# Patient Record
Sex: Female | Born: 1995 | Hispanic: Yes | Smoking: Never smoker
Health system: Southern US, Community
[De-identification: ages and names within clinical notes are randomized; demographics above are authoritative.]

## PROBLEM LIST (undated history)

## (undated) ENCOUNTER — Inpatient Hospital Stay: Payer: Self-pay

## (undated) DIAGNOSIS — J302 Other seasonal allergic rhinitis: Secondary | ICD-10-CM

## (undated) DIAGNOSIS — K5900 Constipation, unspecified: Secondary | ICD-10-CM

## (undated) HISTORY — PX: NO PAST SURGERIES: SHX2092

---
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
## 2017-02-01 ENCOUNTER — Emergency Department: Payer: BLUE CROSS/BLUE SHIELD

## 2017-02-01 ENCOUNTER — Encounter: Payer: Self-pay | Admitting: *Deleted

## 2017-02-01 DIAGNOSIS — J189 Pneumonia, unspecified organism: Secondary | ICD-10-CM | POA: Insufficient documentation

## 2017-02-01 DIAGNOSIS — Z79899 Other long term (current) drug therapy: Secondary | ICD-10-CM | POA: Insufficient documentation

## 2017-02-01 DIAGNOSIS — F17210 Nicotine dependence, cigarettes, uncomplicated: Secondary | ICD-10-CM | POA: Insufficient documentation

## 2017-02-01 DIAGNOSIS — R0789 Other chest pain: Secondary | ICD-10-CM | POA: Insufficient documentation

## 2017-02-01 DIAGNOSIS — R079 Chest pain, unspecified: Secondary | ICD-10-CM | POA: Diagnosis present

## 2017-02-01 NOTE — ED Triage Notes (Signed)
Pt c/o ongoing cough w/ related chest pain that is reproducible w/ cough, movement and palpation. Pt c/o nausea x 2-3 days. Pt states pain w/ cough worsening since yesterday. Pt c/o shortness of breath and dizziness x 2 weeks ago and has been intermittent. Pt is ambulatory and in no acute respiratory distress, able to complete full sentences and in not tachypneic.

## 2017-02-01 NOTE — ED Triage Notes (Signed)
Pt ambulatory to triage, A&O x 4, no acute distress observed.

## 2017-02-02 ENCOUNTER — Emergency Department
Admission: EM | Admit: 2017-02-02 | Discharge: 2017-02-02 | Disposition: A | Discharge status: Home / Self Care | Payer: BLUE CROSS/BLUE SHIELD | Attending: Emergency Medicine | Admitting: Emergency Medicine

## 2017-02-02 DIAGNOSIS — R0789 Other chest pain: Secondary | ICD-10-CM

## 2017-02-02 DIAGNOSIS — J189 Pneumonia, unspecified organism: Secondary | ICD-10-CM

## 2017-02-02 LAB — BASIC METABOLIC PANEL
Anion gap: 8 (ref 5–15)
BUN: 17 mg/dL (ref 6–20)
CO2: 25 mmol/L (ref 22–32)
CREATININE: 0.46 mg/dL (ref 0.44–1.00)
Calcium: 9.4 mg/dL (ref 8.9–10.3)
Chloride: 106 mmol/L (ref 101–111)
GFR calc Af Amer: >60 mL/min (ref >60)
Glucose, Bld: 86 mg/dL (ref 65–99)
POTASSIUM: 3.6 mmol/L (ref 3.5–5.1)
SODIUM: 139 mmol/L (ref 135–145)

## 2017-02-02 LAB — CBC
HCT: 38.1 % (ref 35.0–47.0)
Hemoglobin: 13.1 g/dL (ref 12.0–16.0)
MCH: 27.1 pg (ref 26.0–34.0)
MCHC: 34.4 g/dL (ref 32.0–36.0)
MCV: 78.7 fL — ABNORMAL LOW (ref 80.0–100.0)
PLATELETS: 307 10*3/uL (ref 150–440)
RBC: 4.84 MIL/uL (ref 3.80–5.20)
RDW: 15.8 % — ABNORMAL HIGH (ref 11.5–14.5)
WBC: 10.7 10*3/uL (ref 3.6–11.0)

## 2017-02-02 LAB — FIBRIN DERIVATIVES D-DIMER (ARMC ONLY): Fibrin derivatives D-dimer (ARMC): 191.17 (ref 0.00–499.00)

## 2017-02-02 LAB — TROPONIN I

## 2017-02-02 MED ORDER — TRAMADOL HCL 50 MG PO TABS
ORAL_TABLET | ORAL | Status: AC
  Filled 2017-02-02: qty 1

## 2017-02-02 MED ORDER — AZITHROMYCIN 250 MG PO TABS: ORAL_TABLET | ORAL | 0 refills | Status: AC

## 2017-02-02 MED ORDER — TRAMADOL HCL 50 MG PO TABS
50.0000 mg | ORAL_TABLET | Freq: Once | ORAL | Status: AC
  Administered 2017-02-02: 50 mg via ORAL

## 2017-02-02 MED ORDER — TRAMADOL HCL 50 MG PO TABS: 50.0000 mg | ORAL_TABLET | Freq: Four times a day (QID) | ORAL | 0 refills | Status: DC | PRN

## 2017-02-02 MED ORDER — IBUPROFEN 800 MG PO TABS
800.0000 mg | ORAL_TABLET | Freq: Once | ORAL | Status: AC
  Administered 2017-02-02: 800 mg via ORAL
  Filled 2017-02-02: qty 1

## 2017-02-02 NOTE — ED Provider Notes (Signed)
[facility] Emergency Department Provider Note   ____________________________________________   First MD Initiated Contact with Patient 02/02/17 [phone]     (approximate)  I have reviewed the triage vital signs and the nursing notes.   HISTORY  Chief Complaint Pleurisy and Cough    HPI Gina Lester is a 21 y.o. female who comes into the hospital today with 2 weeks of chest pain. The patient reports that it was not initially bothering her really but then it became sharper. She reports that she did develop some trouble breathing and reports that now when she moves a little bit the side or takes a deep breath or coughs she has pain. The patient reports that she has not taken anything for pain as she doesn't typically like to take medication. The patient denies any sick contacts. She rates her pain a 5 out of 10 in intensity. She states her pain is currently mid chest right greater than left. She's had some nausea and dizziness with no vomiting. She reports that she has no abdominal pain as well. She came in to get evaluated for the symptoms.   Past Medical History:  Diagnosis Date  . Constipation   . Seasonal allergies     Patient Active Problem List   Diagnosis Date Noted  . Premature rupture of membranes 12/10/2015  . Pregnancy 10/23/2015  . Abdominal pain affecting pregnancy 09/07/2015  . Rubella non-immune status, antepartum 05/19/2015  . Maternal varicella, non-immune 05/19/2015  . Abdominal pain 04/17/2014  . Transaminitis 04/17/2014    Past Surgical History:  Procedure Laterality Date  . NO PAST SURGERIES      Prior to Admission medications   Medication Sig Start Date End Date Taking? Authorizing Provider  albuterol (PROVENTIL HFA;VENTOLIN HFA) 108 (90 Base) MCG/ACT inhaler Inhale 2 puffs into the lungs every 6 (six) hours as needed for wheezing or shortness of breath. 05/28/16   Jene Every, MD  albuterol (PROVENTIL HFA;VENTOLIN  HFA) 108 (90 Base) MCG/ACT inhaler Inhale 2 puffs into the lungs every 6 (six) hours as needed for wheezing or shortness of breath. 06/14/16   Emily Filbert, MD  azithromycin (ZITHROMAX Z-PAK) 250 MG tablet Take 2 tablets (500 mg) on  Day 1,  followed by 1 tablet (250 mg) once daily on Days 2 through 5. 02/02/17 02/07/17  Rebecka Apley, MD  cetirizine (ZYRTEC) 10 MG tablet Take 1 tablet (10 mg total) by mouth daily. 06/14/16   Emily Filbert, MD  ondansetron (ZOFRAN ODT) 4 MG disintegrating tablet Take 1 tablet (4 mg total) by mouth every 8 (eight) hours as needed for nausea or vomiting. 06/17/16   Rebecka Apley, MD  predniSONE (STERAPRED UNI-PAK 21 TAB) 10 MG (21) TBPK tablet Dispense steroid taper pack as directed 06/14/16   Emily Filbert, MD  traMADol (ULTRAM) 50 MG tablet Take 1 tablet (50 mg total) by mouth every 6 (six) hours as needed. 02/02/17   Rebecka Apley, MD    Allergies Patient has no known allergies.  Family History  Problem Relation Age of Onset  . Gallbladder disease Mother     Social History Social History  Substance Use Topics  . Smoking status: Current Every Day Smoker    Types: Cigarettes  . Smokeless tobacco: Never Used  . Alcohol use Yes    Review of Systems  Constitutional: No fever/chills Eyes: No visual changes. ENT: No sore throat. Cardiovascular:  chest pain. Respiratory:  shortness of breath. Gastrointestinal: No  abdominal pain.  No nausea, no vomiting.  No diarrhea.  No constipation. Genitourinary: Negative for dysuria. Musculoskeletal: Negative for back pain. Skin: Negative for rash. Neurological: Negative for headaches, focal weakness or numbness.   ____________________________________________   PHYSICAL EXAM:  VITAL SIGNS: ED Triage Vitals  Enc Vitals Group     BP 02/01/17 2319 103/61     Pulse Rate 02/01/17 2319 64     Resp 02/01/17 2319 20     Temp 02/01/17 2319 98.6 F (37 C)     Temp Source  02/01/17 2319 Oral     SpO2 02/01/17 2319 100 %     Weight 02/01/17 2319 135 lb (61.2 kg)     Height 02/01/17 2319 5' (1.524 m)     Head Circumference --      Peak Flow --      Pain Score 02/01/17 2318 4     Pain Loc --      Pain Edu? --      Excl. in GC? --     Constitutional: Alert and oriented. Well appearing and in Mild distress. Eyes: Conjunctivae are normal. PERRL. EOMI. Head: Atraumatic. Nose: No congestion/rhinnorhea. Mouth/Throat: Mucous membranes are moist.  Oropharynx non-erythematous. Cardiovascular: Normal rate, regular rhythm. Grossly normal heart sounds.  Good peripheral circulation. Respiratory: Normal respiratory effort.  No retractions. Lungs CTAB. Tenderness to palpation of right anterior chest wall Gastrointestinal: Soft and nontender. No distention. Positive bowel sounds Musculoskeletal: No lower extremity tenderness nor edema.   Neurologic:  Normal speech and language.  Skin:  Skin is warm, dry and intact. Marland Kitchen Psychiatric: Mood and affect are normal.   ____________________________________________   LABS (all labs ordered are listed, but only abnormal results are displayed)  Labs Reviewed  CBC - Abnormal; Notable for the following:       Result Value   MCV 78.7 (*)    RDW 15.8 (*)    All other components within normal limits  BASIC METABOLIC PANEL  TROPONIN I  FIBRIN DERIVATIVES D-DIMER ([facility] ONLY)   ____________________________________________  EKG  ED ECG REPORT I, Rebecka Apley, the attending physician, personally viewed and interpreted this ECG.   Date: 02/01/2017  EKG Time: 2346  Rate: 64  Rhythm: normal sinus rhythm  Axis: normal  Intervals:none  ST&T Change: none  ____________________________________________  RADIOLOGY  Dg Chest 2 View  Result Date: 02/02/2017 CLINICAL DATA:  Cough and reproducible chest pain. Shortness of breath for 2 weeks. EXAM: CHEST  2 VIEW COMPARISON:  Chest radiograph June 14, 2016 FINDINGS:  Cardiomediastinal silhouette is normal. Faint patchy interstitial prominence. No pleural effusions or focal consolidations. Trachea projects midline and there is no pneumothorax. Soft tissue planes and included osseous structures are non-suspicious. IMPRESSION: Faint interstitial prominence seen with atypical infection and reactive airway disease. Electronically Signed   By: Awilda Metro M.D.   On: 02/02/2017 00:24    ____________________________________________   PROCEDURES  Procedure(s) performed: None  Procedures  Critical Care performed: No  ____________________________________________   INITIAL IMPRESSION / ASSESSMENT AND PLAN / ED COURSE  Pertinent labs & imaging results that were available during my care of the patient were reviewed by me and considered in my medical decision making (see chart for details).  This is an 21 year old female who comes into the hospital today with some chest pain. She's also had some shortness of breath and pain with deep inspiration breathing coughing and moving. I did check a CBC and BMP and troponin and a d-dimer. As  the patient has been having pain for 2 weeks I felt that the patient did not need a repeat troponin just one. The patient also did have a chest x-ray which showed some patchy airspace disease. The patient d-dimer is not unremarkable. As the patient's blood work is unremarkable and feel that I will treat the patient for possible pneumonia given her chest x-ray. The patient also receive a dose of tramadol and ibuprofen. I will discharge the patient to home and encouraged her to follow up with her primary care physician or the acute care clinic. The patient has no further complaints or concerns has no other questions at this time. She'll be discharged home.      ____________________________________________   FINAL CLINICAL IMPRESSION(S) / ED DIAGNOSES  Final diagnoses:  Chest wall pain  Pneumonia due to infectious organism,  unspecified laterality, unspecified part of lung      NEW MEDICATIONS STARTED DURING THIS VISIT:  Discharge Medication List as of 02/02/2017  3:44 AM    START taking these medications   Details  azithromycin (ZITHROMAX Z-PAK) 250 MG tablet Take 2 tablets (500 mg) on  Day 1,  followed by 1 tablet (250 mg) once daily on Days 2 through 5., Print    traMADol (ULTRAM) 50 MG tablet Take 1 tablet (50 mg total) by mouth every 6 (six) hours as needed., Starting Sat 02/02/2017, Print         Note:  This document was prepared using Dragon voice recognition software and may include unintentional dictation errors.    Rebecka ApleyWebster, Idali Lafever P, MD 02/02/17 [phone]

## 2017-02-02 NOTE — Discharge Instructions (Signed)
Please follow up with your primary care physician or with the acute care clinic for further evaluation

## 2017-04-16 ENCOUNTER — Encounter: Payer: Self-pay | Admitting: Emergency Medicine

## 2017-04-16 ENCOUNTER — Emergency Department
Admission: EM | Admit: 2017-04-16 | Discharge: 2017-04-16 | Disposition: A | Discharge status: Home / Self Care | Payer: BLUE CROSS/BLUE SHIELD | Attending: Emergency Medicine | Admitting: Emergency Medicine

## 2017-04-16 DIAGNOSIS — Z79899 Other long term (current) drug therapy: Secondary | ICD-10-CM | POA: Diagnosis not present

## 2017-04-16 DIAGNOSIS — B0052 Herpesviral keratitis: Secondary | ICD-10-CM | POA: Insufficient documentation

## 2017-04-16 DIAGNOSIS — H1013 Acute atopic conjunctivitis, bilateral: Secondary | ICD-10-CM | POA: Insufficient documentation

## 2017-04-16 DIAGNOSIS — F17210 Nicotine dependence, cigarettes, uncomplicated: Secondary | ICD-10-CM | POA: Diagnosis not present

## 2017-04-16 DIAGNOSIS — H5713 Ocular pain, bilateral: Secondary | ICD-10-CM | POA: Diagnosis present

## 2017-04-16 MED ORDER — KETOROLAC TROMETHAMINE 0.5 % OP SOLN: 1.0000 [drp] | Freq: Four times a day (QID) | OPHTHALMIC | 0 refills | Status: DC

## 2017-04-16 MED ORDER — TETRACAINE HCL 0.5 % OP SOLN
2.0000 [drp] | Freq: Once | OPHTHALMIC | Status: DC
  Filled 2017-04-16: qty 4

## 2017-04-16 MED ORDER — FLUORESCEIN SODIUM 0.6 MG OP STRP
1.0000 | ORAL_STRIP | Freq: Once | OPHTHALMIC | Status: DC
  Filled 2017-04-16: qty 1

## 2017-04-16 MED ORDER — AZELASTINE HCL 0.05 % OP SOLN: 1.0000 [drp] | Freq: Two times a day (BID) | OPHTHALMIC | 1 refills | Status: DC

## 2017-04-16 MED ORDER — CIPROFLOXACIN HCL 0.3 % OP SOLN: 1.0000 [drp] | OPHTHALMIC | 0 refills | Status: AC

## 2017-04-16 NOTE — ED Provider Notes (Signed)
[facility] Emergency Department Provider Note  ____________________________________________  Time seen: Approximately 9:18 PM  I have reviewed the triage vital signs and the nursing notes.   HISTORY  Chief Complaint Eye Pain    HPI Gina Lester is a 21 y.o. female presents emergency department complaining ofbilateral eye pain. Patient reports that she has had itching, burning, redness to the right eye 2 weeks. Over the last 3-4 days she has began to experience similar symptoms to left eye. No definitive drainage. Patient reports that she has severe allergic allergies to seasonal pollens. No nasal congestion, sinus pressure. No visual changes. Patient denies any trauma to the eye. Patient wears glasses but does not wear contacts.   Past Medical History:  Diagnosis Date  . Constipation   . Seasonal allergies     Patient Active Problem List   Diagnosis Date Noted  . Premature rupture of membranes 12/10/2015  . Pregnancy 10/23/2015  . Abdominal pain affecting pregnancy 09/07/2015  . Rubella non-immune status, antepartum 05/19/2015  . Maternal varicella, non-immune 05/19/2015  . Abdominal pain 04/17/2014  . Transaminitis 04/17/2014    Past Surgical History:  Procedure Laterality Date  . NO PAST SURGERIES      Prior to Admission medications   Medication Sig Start Date End Date Taking? Authorizing Provider  albuterol (PROVENTIL HFA;VENTOLIN HFA) 108 (90 Base) MCG/ACT inhaler Inhale 2 puffs into the lungs every 6 (six) hours as needed for wheezing or shortness of breath. 05/28/16   Jene Every, MD  albuterol (PROVENTIL HFA;VENTOLIN HFA) 108 (90 Base) MCG/ACT inhaler Inhale 2 puffs into the lungs every 6 (six) hours as needed for wheezing or shortness of breath. 06/14/16   Emily Filbert, MD  azelastine (OPTIVAR) 0.05 % ophthalmic solution Place 1 drop into both eyes 2 (two) times daily. 04/16/17   Helana Macbride, Delorise Royals, PA-C  cetirizine  (ZYRTEC) 10 MG tablet Take 1 tablet (10 mg total) by mouth daily. 06/14/16   Emily Filbert, MD  ciprofloxacin (CILOXAN) 0.3 % ophthalmic solution Place 1 drop into both eyes every 2 (two) hours. Administer 1 drop, every 2 hours, while awake, for 2 days. Then 1 drop, every 4 hours, while awake, for the next 5 days. 04/16/17 04/21/17  Yesli Vanderhoff, Delorise Royals, PA-C  ketorolac (ACULAR) 0.5 % ophthalmic solution Place 1 drop into both eyes 4 (four) times daily. 04/16/17   Cybil Senegal, Delorise Royals, PA-C  ondansetron (ZOFRAN ODT) 4 MG disintegrating tablet Take 1 tablet (4 mg total) by mouth every 8 (eight) hours as needed for nausea or vomiting. 06/17/16   Rebecka Apley, MD  predniSONE (STERAPRED UNI-PAK 21 TAB) 10 MG (21) TBPK tablet Dispense steroid taper pack as directed 06/14/16   Emily Filbert, MD  traMADol (ULTRAM) 50 MG tablet Take 1 tablet (50 mg total) by mouth every 6 (six) hours as needed. 02/02/17   Rebecka Apley, MD    Allergies Patient has no known allergies.  Family History  Problem Relation Age of Onset  . Gallbladder disease Mother     Social History Social History  Substance Use Topics  . Smoking status: Current Every Day Smoker    Types: Cigarettes  . Smokeless tobacco: Never Used  . Alcohol use Yes     Review of Systems  Constitutional: No fever/chills Eyes: No visual changes. No discharge. Positive for irritation, pain to bilateral eyes, right eye is been ongoing 2 weeks, left eye 3-4 days. ENT: No upper respiratory complaints. Cardiovascular: no chest  pain. Respiratory: no cough. No SOB. Gastrointestinal: No abdominal pain.  No nausea, no vomiting.  No diarrhea.  No constipation. Musculoskeletal: Negative for musculoskeletal pain. Skin: Negative for rash, abrasions, lacerations, ecchymosis. Neurological: Negative for headaches, focal weakness or numbness. 10-point ROS otherwise negative.  ____________________________________________   PHYSICAL  EXAM:  VITAL SIGNS: ED Triage Vitals  Enc Vitals Group     BP 04/16/17 1914 112/74     Pulse Rate 04/16/17 1914 90     Resp 04/16/17 1914 17     Temp 04/16/17 1914 97.8 F (36.6 C)     Temp Source 04/16/17 1914 Oral     SpO2 04/16/17 1914 99 %     Weight 04/16/17 1916 135 lb (61.2 kg)     Height --      Head Circumference --      Peak Flow --      Pain Score 04/16/17 2010 4     Pain Loc --      Pain Edu? --      Excl. in GC? --      Constitutional: Alert and oriented. Well appearing and in no acute distress. Eyes: Conjunctivae are Erythematous bilaterally.Marland Kitchen PERRL. EOMI. no visible foreign body with funduscopic exam. Funduscopic exam of both eyes reveals good red reflex, vasculature optic disc with no abnormality. Both eyes are anesthetized using tetracaine drops. Fluorescein staining is applied. Area of uptake in the 6:00 position right I, area of uptake 9:00 position left eye. No foreign body. Areas of uptake consistent with dendritic ulcer. Head: Atraumatic. ENT:      Ears:       Nose: No congestion/rhinnorhea.      Mouth/Throat: Mucous membranes are moist.  Neck: No stridor.    Cardiovascular: Normal rate, regular rhythm. Normal S1 and S2.  Good peripheral circulation. Respiratory: Normal respiratory effort without tachypnea or retractions. Lungs CTAB. Good air entry to the bases with no decreased or absent breath sounds. Musculoskeletal: Full range of motion to all extremities. No gross deformities appreciated. Neurologic:  Normal speech and language. No gross focal neurologic deficits are appreciated.  Skin:  Skin is warm, dry and intact. No rash noted. Psychiatric: Mood and affect are normal. Speech and behavior are normal. Patient exhibits appropriate insight and judgement.   ____________________________________________   LABS (all labs ordered are listed, but only abnormal results are displayed)  Labs Reviewed - No data to  display ____________________________________________  EKG   ____________________________________________  RADIOLOGY   No results found.  ____________________________________________    PROCEDURES  Procedure(s) performed:    Procedures    Medications  fluorescein ophthalmic strip 1 strip (not administered)  tetracaine (PONTOCAINE) 0.5 % ophthalmic solution 2 drop (not administered)     ____________________________________________   INITIAL IMPRESSION / ASSESSMENT AND PLAN / ED COURSE  Pertinent labs & imaging results that were available during my care of the patient were reviewed by me and considered in my medical decision making (see chart for details).  Review of the [place] CSRS was performed in accordance of the [facility] prior to dispensing any controlled drugs.     Patient's diagnosis is consistent with allergic conjunctivitis that has led to dendritic ulcers bilaterally. No contact wearing. Patient will be treated with Cipro eyedrops, Acular for symptom control, Optivar for allergic conjunctivitis symptoms.. Patient will follow-up with ophthalmology as needed. Patient is given ED precautions to return to the ED for any worsening or new symptoms.     ____________________________________________  FINAL CLINICAL IMPRESSION(S) /  ED DIAGNOSES  Final diagnoses:  Dendritic ulcer  Allergic conjunctivitis of both eyes      NEW MEDICATIONS STARTED DURING THIS VISIT:  New Prescriptions   AZELASTINE (OPTIVAR) 0.05 % OPHTHALMIC SOLUTION    Place 1 drop into both eyes 2 (two) times daily.   CIPROFLOXACIN (CILOXAN) 0.3 % OPHTHALMIC SOLUTION    Place 1 drop into both eyes every 2 (two) hours. Administer 1 drop, every 2 hours, while awake, for 2 days. Then 1 drop, every 4 hours, while awake, for the next 5 days.   KETOROLAC (ACULAR) 0.5 % OPHTHALMIC SOLUTION    Place 1 drop into both eyes 4 (four) times daily.        This chart was dictated using voice recognition  software/Dragon. Despite best efforts to proofread, errors can occur which can change the meaning. Any change was purely unintentional.    Racheal Patches, PA-C 04/16/17 2124    Dionne Bucy, MD 04/16/17 2142

## 2017-04-16 NOTE — ED Notes (Signed)
Patient discharged to home per MD order. Patient in stable condition, and deemed medically cleared by ED provider for discharge. Discharge instructions reviewed with patient/family using "Teach Back"; verbalized understanding of medication education and administration, and information about follow-up care. Denies further concerns. ° °

## 2017-04-16 NOTE — ED Notes (Signed)
Pt usually wears glasses for distance, but does not have them with her at this time.

## 2017-04-16 NOTE — ED Triage Notes (Signed)
Pt c/o right eye pain x2 weeks. Pt describes eye pain as "something in it and scratchy." Per pt she has tried to wash out the eye as well as placed drop, without relief. Pts right eye is red on assessment with clear drainage.

## 2017-04-16 NOTE — ED Notes (Signed)
Pt in with co right eye pain x 2 weeks denies any injury or trauma. States "feels like I have an eyelash In eye". Has tried otc eye drops without relief, denies any itching or discharge.

## 2017-04-16 NOTE — ED Notes (Signed)
Pt states R eye "has been bothering" her x 2 weeks. States was using eye drops. Has eye appt in November but states. Pt wearing sunglasses. States "at the beginning there was an eyelash in there." pt keeps rubbing eye. States sometimes L eye bothers her. Denies seasonal allergies.

## 2017-07-23 NOTE — L&D Delivery Note (Signed)
Delivery Note   Otho PerlMaribel Baltazar Shon HaleLeon is a 22 y.o. G2P1001 at 7882w5d Estimated Date of Delivery: 04/10/18  PRE-OPERATIVE DIAGNOSIS:  1) 2282w5d pregnancy.    POST-OPERATIVE DIAGNOSIS:  1) 6082w5d pregnancy s/p Vaginal, Spontaneous    Delivery Type: Vaginal, Spontaneous    Delivery Anesthesia: None   Labor Complications:   none    ESTIMATED BLOOD LOSS: 200 ml    FINDINGS:   1) female infant, Apgar scores of 8  at 1 minute and 9  at 5 minutes and a birthweight of    7lbs 9ozounces.    2) Nuchal cord: yes, loose .   SPECIMENS:   PLACENTA:   Appearance: Intact , cord blood sample collected   Removal: Spontaneous      Disposition:   Held per protocol then discarded  DISPOSITION:  Infant to left in stable condition in the delivery room, with L&D personnel and mother,  NARRATIVE SUMMARY: Labor course:  Ms. Johny ChessMaribel Baltazar Leon is a G2P1001 at 7782w5d who presented for labor management.  She progressed well in labor without pitocin.  She received the no anesthesia and proceeded to complete dilation. She evidenced good maternal expulsive effort during the second stage. She went on to deliver a viable female infant" Padro". Head delivered OA then rotated to LOT for delivery of shoulder. The placenta delivered without problems and was noted to be complete. A perineal and vaginal examination was performed. Episiotomy/Lacerations: None . Vaginal vault count correct x 2. The patient tolerated this well.  Doreene Burkennie Teancum Brule, CNM  04/08/2018 12:23 PM

## 2017-08-22 ENCOUNTER — Encounter: Payer: Self-pay | Admitting: Certified Nurse Midwife

## 2017-08-22 ENCOUNTER — Other Ambulatory Visit (INDEPENDENT_AMBULATORY_CARE_PROVIDER_SITE_OTHER): Payer: BLUE CROSS/BLUE SHIELD

## 2017-08-22 ENCOUNTER — Other Ambulatory Visit: Payer: Self-pay | Admitting: Certified Nurse Midwife

## 2017-08-22 ENCOUNTER — Ambulatory Visit: Payer: BLUE CROSS/BLUE SHIELD | Admitting: Certified Nurse Midwife

## 2017-08-22 VITALS — BP 108/62 | HR 68 | Ht 60.0 in | Wt 141.2 lb

## 2017-08-22 DIAGNOSIS — Z3201 Encounter for pregnancy test, result positive: Secondary | ICD-10-CM

## 2017-08-22 DIAGNOSIS — O26899 Other specified pregnancy related conditions, unspecified trimester: Secondary | ICD-10-CM | POA: Diagnosis not present

## 2017-08-22 DIAGNOSIS — R109 Unspecified abdominal pain: Secondary | ICD-10-CM

## 2017-08-22 DIAGNOSIS — N926 Irregular menstruation, unspecified: Secondary | ICD-10-CM | POA: Diagnosis not present

## 2017-08-22 LAB — POCT URINE PREGNANCY: PREG TEST UR: POSITIVE — AB

## 2017-08-22 NOTE — Patient Instructions (Signed)
Common Medications Safe in Pregnancy  Acne:      Constipation:  Benzoyl Peroxide     Colace  Clindamycin      Dulcolax Suppository  Topica Erythromycin     Fibercon  Salicylic Acid      Metamucil         Miralax AVOID:        Senakot   Accutane    Cough:  Retin-A       Cough Drops  Tetracycline      Phenergan w/ Codeine if Rx  Minocycline      Robitussin (Plain & DM)  Antibiotics:     Crabs/Lice:  Ceclor       RID  Cephalosporins    AVOID:  E-Mycins      Kwell  Keflex  Macrobid/Macrodantin   Diarrhea:  Penicillin      Kao-Pectate  Zithromax      Imodium AD         PUSH FLUIDS AVOID:       Cipro     Fever:  Tetracycline      Tylenol (Regular or Extra  Minocycline       Strength)  Levaquin      Extra Strength-Do not          Exceed 8 tabs/24 hrs Caffeine:        <200mg/day (equiv. To 1 cup of coffee or  approx. 3 12 oz sodas)         Gas: Cold/Hayfever:       Gas-X  Benadryl      Mylicon  Claritin       Phazyme  **Claritin-D        Chlor-Trimeton    Headaches:  Dimetapp      ASA-Free Excedrin  Drixoral-Non-Drowsy     Cold Compress  Mucinex (Guaifenasin)     Tylenol (Regular or Extra  Sudafed/Sudafed-12 Hour     Strength)  **Sudafed PE Pseudoephedrine   Tylenol Cold & Sinus     Vicks Vapor Rub  Zyrtec  **AVOID if Problems With Blood Pressure         Heartburn: Avoid lying down for at least 1 hour after meals  Aciphex      Maalox     Rash:  Milk of Magnesia     Benadryl    Mylanta       1% Hydrocortisone Cream  Pepcid  Pepcid Complete   Sleep Aids:  Prevacid      Ambien   Prilosec       Benadryl  Rolaids       Chamomile Tea  Tums (Limit 4/day)     Unisom  Zantac       Tylenol PM         Warm milk-add vanilla or  Hemorrhoids:       Sugar for taste  Anusol/Anusol H.C.  (RX: Analapram 2.5%)  Sugar Substitutes:  Hydrocortisone OTC     Ok in moderation  Preparation H      Tucks        Vaseline lotion applied to tissue with  wiping    Herpes:     Throat:  Acyclovir      Oragel  Famvir  Valtrex     Vaccines:         Flu Shot Leg Cramps:       *Gardasil  Benadryl      Hepatitis A         Hepatitis B Nasal Spray:         Pneumovax  Saline Nasal Spray     Polio Booster         Tetanus Nausea:       Tuberculosis test or PPD  Vitamin B6 25 mg TID   AVOID:    Dramamine      *Gardasil  Emetrol       Live Poliovirus  Ginger Root 250 mg QID    MMR (measles, mumps &  High Complex Carbs @ Bedtime    rebella)  Sea Bands-Accupressure    Varicella (Chickenpox)  Unisom 1/2 tab TID     *No known complications           If received before Pain:         Known pregnancy;   Darvocet       Resume series after  Lortab        Delivery  Percocet    Yeast:   Tramadol      Femstat  Tylenol 3      Gyne-lotrimin  Ultram       Monistat  Vicodin           MISC:         All Sunscreens           Hair Coloring/highlights          Insect Repellant's          (Including DEET)         Mystic Tans Abdominal Pain During Pregnancy Belly (abdominal) pain is common during pregnancy. Most of the time, it is not a serious problem. Other times, it can be a sign that something is wrong with the pregnancy. Always tell your doctor if you have belly pain. Follow these instructions at home: Monitor your belly pain for any changes. The following actions may help you feel better:  Do not have sex (intercourse) or put anything in your vagina until you feel better.  Rest until your pain stops.  Drink clear fluids if you feel sick to your stomach (nauseous). Do not eat solid food until you feel better.  Only take medicine as told by your doctor.  Keep all doctor visits as told.  Get help right away if:  You are bleeding, leaking fluid, or pieces of tissue come out of your vagina.  You have more pain or cramping.  You keep throwing up (vomiting).  You have pain when you pee (urinate) or have blood in your pee.  You have a  fever.  You do not feel your baby moving as much.  You feel very weak or feel like passing out.  You have trouble breathing, with or without belly pain.  You have a very bad headache and belly pain.  You have fluid leaking from your vagina and belly pain.  You keep having watery poop (diarrhea).  Your belly pain does not go away after resting, or the pain gets worse. This information is not intended to replace advice given to you by your health care provider. Make sure you discuss any questions you have with your health care provider. Document Released: 06/27/2009 Document Revised: 02/15/2016 Document Reviewed: 02/05/2013 Elsevier Interactive Patient Education  2018 [employer] Back Pain in Pregnancy Back pain during pregnancy is common. Back pain may be caused by several factors that are related to changes during your pregnancy. Follow these instructions at home: Managing pain, stiffness, and swelling  If directed, apply ice for sudden (acute) back pain. ? Put ice in a plastic bag. ? Place   a towel between your skin and the bag. ? Leave the ice on for 20 minutes, 2-3 times per day.  If directed, apply heat to the affected area before you exercise: ? Place a towel between your skin and the heat pack or heating pad. ? Leave the heat on for 20-30 minutes. ? Remove the heat if your skin turns bright red. This is especially important if you are unable to feel pain, heat, or cold. You may have a greater risk of getting burned. Activity  Exercise as told by your health care provider. Exercising is the best way to prevent or manage back pain.  Listen to your body when lifting. If lifting hurts, ask for help or bend your knees. This uses your leg muscles instead of your back muscles.  Squat down when picking up something from the floor. Do not bend over.  Only use bed rest as told by your health care provider. Bed rest should only be used for the most severe episodes of back  pain. Standing, Sitting, and Lying Down  Do not stand in one place for long periods of time.  Use good posture when sitting. Make sure your head rests over your shoulders and is not hanging forward. Use a pillow on your lower back if necessary.  Try sleeping on your side, preferably the left side, with a pillow or two between your legs. If you are sore after a night's rest, your bed may be too soft. A firm mattress may provide more support for your back during pregnancy. General instructions  Do not wear high heels.  Eat a healthy diet. Try to gain weight within your health care provider's recommendations.  Use a maternity girdle, elastic sling, or back brace as told by your health care provider.  Take over-the-counter and prescription medicines only as told by your health care provider.  Keep all follow-up visits as told by your health care provider. This is important. This includes any visits with any specialists, such as a physical therapist. Contact a health care provider if:  Your back pain interferes with your daily activities.  You have increasing pain in other parts of your body. Get help right away if:  You develop numbness, tingling, weakness, or problems with the use of your arms or legs.  You develop severe back pain that is not controlled with medicine.  You have a sudden change in bowel or bladder control.  You develop shortness of breath, dizziness, or you faint.  You develop nausea, vomiting, or sweating.  You have back pain that is a rhythmic, cramping pain similar to labor pains. Labor pain is usually 1-2 minutes apart, lasts for about 1 minute, and involves a bearing down feeling or pressure in your pelvis.  You have back pain and your water breaks or you have vaginal bleeding.  You have back pain or numbness that travels down your leg.  Your back pain developed after you fell.  You develop pain on one side of your back.  You see blood in your  urine.  You develop skin blisters in the area of your back pain. This information is not intended to replace advice given to you by your health care provider. Make sure you discuss any questions you have with your health care provider. Document Released: 10/17/2005 Document Revised: 12/15/2015 Document Reviewed: 03/23/2015 Elsevier Interactive Patient Education  2018 [place] for Pregnant Women While you are pregnant, your body will require additional nutrition to help support your  growing baby. It is recommended that you consume:  150 additional calories each day during your first trimester.  300 additional calories each day during your second trimester.  300 additional calories each day during your third trimester.  Eating a healthy, well-balanced diet is very important for your health and for your baby's health. You also have a higher need for some vitamins and minerals, such as folic acid, calcium, iron, and vitamin D. What do I need to know about eating during pregnancy?  Do not try to lose weight or go on a diet during pregnancy.  Choose healthy, nutritious foods. Choose  of a sandwich with a glass of milk instead of a candy bar or a high-calorie sugar-sweetened beverage.  Limit your overall intake of foods that have "empty calories." These are foods that have little nutritional value, such as sweets, desserts, candies, sugar-sweetened beverages, and fried foods.  Eat a variety of foods, especially fruits and vegetables.  Take a prenatal vitamin to help meet the additional needs during pregnancy, specifically for folic acid, iron, calcium, and vitamin D.  Remember to stay active. Ask your health care provider for exercise recommendations that are specific to you.  Practice good food safety and cleanliness, such as washing your hands before you eat and after you prepare raw meat. This helps to prevent foodborne illnesses, such as listeriosis, that can be very  dangerous for your baby. Ask your health care provider for more information about listeriosis. What does 150 extra calories look like? Healthy options for an additional 150 calories each day could be any of the following:  Plain low-fat yogurt (6-8 oz) with  cup of berries.  1 apple with 2 teaspoons of peanut butter.  Cut-up vegetables with  cup of hummus.  Low-fat chocolate milk (8 oz or 1 cup).  1 string cheese with 1 medium orange.   of a peanut butter and jelly sandwich on whole-wheat bread (1 tsp of peanut butter).  For 300 calories, you could eat two of those healthy options each day. What is a healthy amount of weight to gain? The recommended amount of weight for you to gain is based on your pre-pregnancy BMI. If your pre-pregnancy BMI was:  Less than 18 (underweight), you should gain 28-40 lb.  18-24.9 (normal), you should gain 25-35 lb.  25-29.9 (overweight), you should gain 15-25 lb.  Greater than 30 (obese), you should gain 11-20 lb.  What if I am having twins or multiples? Generally, pregnant women who will be having twins or multiples may need to increase their daily calories by 300-600 calories each day. The recommended range for total weight gain is 25-54 lb, depending on your pre-pregnancy BMI. Talk with your health care provider for specific guidance about additional nutritional needs, weight gain, and exercise during your pregnancy. What foods can I eat? Grains Any grains. Try to choose whole grains, such as whole-wheat bread, oatmeal, or brown rice. Vegetables Any vegetables. Try to eat a variety of colors and types of vegetables to get a full range of vitamins and minerals. Remember to wash your vegetables well before eating. Fruits Any fruits. Try to eat a variety of colors and types of fruit to get a full range of vitamins and minerals. Remember to wash your fruits well before eating. Meats and Other Protein Sources Lean meats, including chicken, [place],  fish, and lean cuts of beef, veal, or pork. Make sure that all meats are cooked to "well done." Tofu. Tempeh. Beans. Eggs. Peanut butter  and other nut butters. Seafood, such as shrimp, crab, and lobster. If you choose fish, select types that are higher in omega-3 fatty acids, including salmon, herring, mussels, trout, sardines, and pollock. Make sure that all meats are cooked to food-safe temperatures. Dairy Pasteurized milk and milk alternatives. Pasteurized yogurt and pasteurized cheese. Cottage cheese. Sour cream. Beverages Water. Juices that contain 100% fruit juice or vegetable juice. Caffeine-free teas and decaffeinated coffee. Drinks that contain caffeine are okay to drink, but it is better to avoid caffeine. Keep your total caffeine intake to less than 200 mg each day (12 oz of coffee, tea, or soda) or as directed by your health care provider. Condiments Any pasteurized condiments. Sweets and Desserts Any sweets and desserts. Fats and Oils Any fats and oils. The items listed above may not be a complete list of recommended foods or beverages. Contact your dietitian for more options. What foods are not recommended? Vegetables Unpasteurized (raw) vegetable juices. Fruits Unpasteurized (raw) fruit juices. Meats and Other Protein Sources Cured meats that have nitrates, such as bacon, salami, and hotdogs. Luncheon meats, bologna, or other deli meats (unless they are reheated until they are steaming hot). Refrigerated pate, meat spreads from a meat counter, smoked seafood that is found in the refrigerated section of a store. Raw fish, such as sushi or sashimi. High mercury content fish, such as tilefish, shark, swordfish, and king mackerel. Raw meats, such as tuna or beef tartare. Undercooked meats and poultry. Make sure that all meats are cooked to food-safe temperatures. Dairy Unpasteurized (raw) milk and any foods that have raw milk in them. Soft cheeses, such as feta, queso blanco, queso  fresco, Brie, Camembert cheeses, blue-veined cheeses, and Panela cheese (unless it is made with pasteurized milk, which must be stated on the label). Beverages Alcohol. Sugar-sweetened beverages, such as sodas, teas, or energy drinks. Condiments Homemade fermented foods and drinks, such as pickles, sauerkraut, or kombucha drinks. (Store-bought pasteurized versions of these are okay.) Other Salads that are made in the store, such as ham salad, chicken salad, egg salad, tuna salad, and seafood salad. The items listed above may not be a complete list of foods and beverages to avoid. Contact your dietitian for more information. This information is not intended to replace advice given to you by your health care provider. Make sure you discuss any questions you have with your health care provider. Document Released: 04/23/2014 Document Revised: 12/15/2015 Document Reviewed: 12/22/2013 Elsevier Interactive Patient Education  2018 [place] of Pregnancy The first trimester of pregnancy is from week 1 until the end of week 13 (months 1 through 3). During this time, your baby will begin to develop inside you. At 6-8 weeks, the eyes and face are formed, and the heartbeat can be seen on ultrasound. At the end of 12 weeks, all the baby's organs are formed. Prenatal care is all the medical care you receive before the birth of your baby. Make sure you get good prenatal care and follow all of your doctor's instructions. Follow these instructions at home: Medicines  Take over-the-counter and prescription medicines only as told by your doctor. Some medicines are safe and some medicines are not safe during pregnancy.  Take a prenatal vitamin that contains at least 600 micrograms (mcg) of folic acid.  If you have trouble pooping (constipation), take medicine that will make your stool soft (stool softener) if your doctor approves. Eating and drinking  Eat regular, healthy meals.  Your doctor  will tell you  the amount of weight gain that is right for you.  Avoid raw meat and uncooked cheese.  If you feel sick to your stomach (nauseous) or throw up (vomit): ? Eat 4 or 5 small meals a day instead of 3 large meals. ? Try eating a few soda crackers. ? Drink liquids between meals instead of during meals.  To prevent constipation: ? Eat foods that are high in fiber, like fresh fruits and vegetables, whole grains, and beans. ? Drink enough fluids to keep your pee (urine) clear or pale yellow. Activity  Exercise only as told by your doctor. Stop exercising if you have cramps or pain in your lower belly (abdomen) or low back.  Do not exercise if it is too hot, too humid, or if you are in a place of great height (high altitude).  Try to avoid standing for long periods of time. Move your legs often if you must stand in one place for a long time.  Avoid heavy lifting.  Wear low-heeled shoes. Sit and stand up straight.  You can have sex unless your doctor tells you not to. Relieving pain and discomfort  Wear a good support bra if your breasts are sore.  Take warm water baths (sitz baths) to soothe pain or discomfort caused by hemorrhoids. Use hemorrhoid cream if your doctor says it is okay.  Rest with your legs raised if you have leg cramps or low back pain.  If you have puffy, bulging veins (varicose veins) in your legs: ? Wear support hose or compression stockings as told by your doctor. ? Raise (elevate) your feet for 15 minutes, 3-4 times a day. ? Limit salt in your food. Prenatal care  Schedule your prenatal visits by the twelfth week of pregnancy.  Write down your questions. Take them to your prenatal visits.  Keep all your prenatal visits as told by your doctor. This is important. Safety  Wear your seat belt at all times when driving.  Make a list of emergency phone numbers. The list should include numbers for family, friends, the hospital, and police and fire  departments. General instructions  Ask your doctor for a referral to a local prenatal class. Begin classes no later than at the start of month 6 of your pregnancy.  Ask for help if you need counseling or if you need help with nutrition. Your doctor can give you advice or tell you where to go for help.  Do not use hot tubs, steam rooms, or saunas.  Do not douche or use tampons or scented sanitary pads.  Do not cross your legs for long periods of time.  Avoid all herbs and alcohol. Avoid drugs that are not approved by your doctor.  Do not use any tobacco products, including cigarettes, chewing tobacco, and electronic cigarettes. If you need help quitting, ask your doctor. You may get counseling or other support to help you quit.  Avoid cat litter boxes and soil used by cats. These carry germs that can cause birth defects in the baby and can cause a loss of your baby (miscarriage) or stillbirth.  Visit your dentist. At home, brush your teeth with a soft toothbrush. Be gentle when you floss. Contact a doctor if:  You are dizzy.  You have mild cramps or pressure in your lower belly.  You have a nagging pain in your belly area.  You continue to feel sick to your stomach, you throw up, or you have watery poop (diarrhea).  You have a  bad smelling fluid coming from your vagina.  You have pain when you pee (urinate).  You have increased puffiness (swelling) in your face, hands, legs, or ankles. Get help right away if:  You have a fever.  You are leaking fluid from your vagina.  You have spotting or bleeding from your vagina.  You have very bad belly cramping or pain.  You gain or lose weight rapidly.  You throw up blood. It may look like coffee grounds.  You are around people who have [place] measles, fifth disease, or chickenpox.  You have a very bad headache.  You have shortness of breath.  You have any kind of trauma, such as from a fall or a car  accident. Summary  The first trimester of pregnancy is from week 1 until the end of week 13 (months 1 through 3).  To take care of yourself and your unborn baby, you will need to eat healthy meals, take medicines only if your doctor tells you to do so, and do activities that are safe for you and your baby.  Keep all follow-up visits as told by your doctor. This is important as your doctor will have to ensure that your baby is healthy and growing well. This information is not intended to replace advice given to you by your health care provider. Make sure you discuss any questions you have with your health care provider. Document Released: 12/26/2007 Document Revised: 07/17/2016 Document Reviewed: 07/17/2016 Elsevier Interactive Patient Education  2017 [employer].

## 2017-08-24 NOTE — Progress Notes (Signed)
GYN ENCOUNTER NOTE  Subjective:       Gina Lester is a 22 y.o. [id number] female here for pregnancy confirmation.   Reports missed menses. Endorses nausea with vomiting and intermittent lower abdominal cramping.   Denies difficulty breathing or respiratory distress, chest pain, abdominal pain, vaginal bleeding, dysuria, and leg pain or swelling.    Gynecologic History  Patient's last menstrual period was 07/04/2017.  Estimated date of birth: 04/10/2016.  Gestational age: 58 weeks 0 days.  Contraception: none.  Last Pap: N/A.   Obstetric History OB History  Gravida Para Term Preterm AB Living  2 1 1      0  SAB TAB Ectopic Multiple Live Births        0      # Outcome Date GA Lbr Len/2nd Weight Sex Delivery Anes PTL Lv  2 Current           1 Term 12/11/15 [id number] / 00:33 7 lb 7.2 oz (3.38 kg) M Vag-Spont None        Past Medical History:  Diagnosis Date  . Constipation   . Seasonal allergies     Past Surgical History:  Procedure Laterality Date  . NO PAST SURGERIES      Current Outpatient Medications on File Prior to Visit  Medication Sig Dispense Refill  . albuterol (PROVENTIL HFA;VENTOLIN HFA) 108 (90 Base) MCG/ACT inhaler Inhale 2 puffs into the lungs every 6 (six) hours as needed for wheezing or shortness of breath. (Patient not taking: Reported on 08/22/2017) 1 Inhaler 2  . cetirizine (ZYRTEC) 10 MG tablet Take 1 tablet (10 mg total) by mouth daily. (Patient not taking: Reported on 08/22/2017) 30 tablet 3  . ketorolac (ACULAR) 0.5 % ophthalmic solution Place 1 drop into both eyes 4 (four) times daily. (Patient not taking: Reported on 08/22/2017) 5 mL 0  . ondansetron (ZOFRAN ODT) 4 MG disintegrating tablet Take 1 tablet (4 mg total) by mouth every 8 (eight) hours as needed for nausea or vomiting. (Patient not taking: Reported on 08/22/2017) 20 tablet 0   No current facility-administered medications on file prior to visit.     No Known Allergies  Social  History   Socioeconomic History  . Marital status: Single    Spouse name: Not on file  . Number of children: Not on file  . Years of education: Not on file  . Highest education level: Not on file  Social Needs  . Financial resource strain: Not on file  . Food insecurity - worry: Not on file  . Food insecurity - inability: Not on file  . Transportation needs - medical: Not on file  . Transportation needs - non-medical: Not on file  Occupational History  . Occupation: Stay at home mother  Tobacco Use  . Smoking status: Current Every Day Smoker    Types: Cigarettes  . Smokeless tobacco: Never Used  Substance and Sexual Activity  . Alcohol use: Yes  . Drug use: No  . Sexual activity: Yes    Birth control/protection: None  Other Topics Concern  . Not on file  Social History Narrative       Family History  Problem Relation Age of Onset  . Gallbladder disease Mother     The following portions of the patient's history were reviewed and updated as appropriate: allergies, current medications, past family history, past medical history, past social history, past surgical history and problem list.  Review of Systems:  Review of Systems - Negative except as  noted above. History obtained from the patient.  Objective:   BP 108/62   Pulse 68   Ht 5' (1.524 m)   Wt 141 lb 3.2 oz (64 kg)   LMP 07/04/2017   BMI 27.58 kg/m   General:  Alert and oriented x 4, no apparent distress.  ULTRASOUND REPORT  Location: [facility] Date of Service:  08/22/2017  Indications: Dating/Viability Findings:  Mason Jim intrauterine pregnancy is visualized with a CRL consistent with 6 2/[redacted] weeks gestation, giving an (U/S) EDD of 04/15/18. The (U/S) EDD is consistent with the clinically established (LMP) EDD of 04/10/18.  FHR: 114 BPM CRL measurement: 5.1 mm Yolk sac and early anatomy is normal.  Right Ovary measures 3.2 x 2.4 x 2.0 cm. It is normal in appearance. Left Ovary  measures 2.9 x 1.9 x 1.2 cm. It is normal appearance. There is no obvious evidence of a corpus luteal cyst. Survey of the adnexa demonstrates no adnexal masses. There is no free peritoneal fluid in the cul de sac.  Impression: 1. 6 2/7 week Viable Singleton Intrauterine pregnancy by U/S. 2. (U/S) EDD is consistent with Clinically established (LMP) EDD of 04/10/18.  Recommendations: 1.Clinical correlation with the patient's History and Physical Exam.  Assessment:   1. Missed menses - POCT urine pregnancy  2. Abdominal cramping affecting pregnancy  Plan:   Ultrasound findings reviewed with patient, verbalized understanding.   Discussed home treatment measures for nausea. Bonjesta sample given.   Reviewed red flag symptoms and when to call.   RTC x 2-3 weeks for nurse intake or sooner if needed.    Gunnar Bulla, CNM [facility], [facility]

## 2017-09-05 ENCOUNTER — Ambulatory Visit: Payer: BLUE CROSS/BLUE SHIELD | Admitting: Certified Nurse Midwife

## 2017-09-05 VITALS — BP 102/66 | HR 77 | Ht 60.0 in | Wt 140.0 lb

## 2017-09-05 DIAGNOSIS — Z3A09 9 weeks gestation of pregnancy: Secondary | ICD-10-CM

## 2017-09-05 NOTE — Patient Instructions (Signed)
First Trimester of Pregnancy The first trimester of pregnancy is from week 1 until the end of week 13 (months 1 through 3). During this time, your baby will begin to develop inside you. At 6-8 weeks, the eyes and face are formed, and the heartbeat can be seen on ultrasound. At the end of 12 weeks, all the baby's organs are formed. Prenatal care is all the medical care you receive before the birth of your baby. Make sure you get good prenatal care and follow all of your doctor's instructions. Follow these instructions at home: Medicines  Take over-the-counter and prescription medicines only as told by your doctor. Some medicines are safe and some medicines are not safe during pregnancy.  Take a prenatal vitamin that contains at least 600 micrograms (mcg) of folic acid.  If you have trouble pooping (constipation), take medicine that will make your stool soft (stool softener) if your doctor approves. Eating and drinking  Eat regular, healthy meals.  Your doctor will tell you the amount of weight gain that is right for you.  Avoid raw meat and uncooked cheese.  If you feel sick to your stomach (nauseous) or throw up (vomit): ? Eat 4 or 5 small meals a day instead of 3 large meals. ? Try eating a few soda crackers. ? Drink liquids between meals instead of during meals.  To prevent constipation: ? Eat foods that are high in fiber, like fresh fruits and vegetables, whole grains, and beans. ? Drink enough fluids to keep your pee (urine) clear or pale yellow. Activity  Exercise only as told by your doctor. Stop exercising if you have cramps or pain in your lower belly (abdomen) or low back.  Do not exercise if it is too hot, too humid, or if you are in a place of great height (high altitude).  Try to avoid standing for long periods of time. Move your legs often if you must stand in one place for a long time.  Avoid heavy lifting.  Wear low-heeled shoes. Sit and stand up straight.  You  can have sex unless your doctor tells you not to. Relieving pain and discomfort  Wear a good support bra if your breasts are sore.  Take warm water baths (sitz baths) to soothe pain or discomfort caused by hemorrhoids. Use hemorrhoid cream if your doctor says it is okay.  Rest with your legs raised if you have leg cramps or low back pain.  If you have puffy, bulging veins (varicose veins) in your legs: ? Wear support hose or compression stockings as told by your doctor. ? Raise (elevate) your feet for 15 minutes, 3-4 times a day. ? Limit salt in your food. Prenatal care  Schedule your prenatal visits by the twelfth week of pregnancy.  Write down your questions. Take them to your prenatal visits.  Keep all your prenatal visits as told by your doctor. This is important. Safety  Wear your seat belt at all times when driving.  Make a list of emergency phone numbers. The list should include numbers for family, friends, the hospital, and police and fire departments. General instructions  Ask your doctor for a referral to a local prenatal class. Begin classes no later than at the start of month 6 of your pregnancy.  Ask for help if you need counseling or if you need help with nutrition. Your doctor can give you advice or tell you where to go for help.  Do not use hot tubs, steam rooms, or   saunas.  Do not douche or use tampons or scented sanitary pads.  Do not cross your legs for long periods of time.  Avoid all herbs and alcohol. Avoid drugs that are not approved by your doctor.  Do not use any tobacco products, including cigarettes, chewing tobacco, and electronic cigarettes. If you need help quitting, ask your doctor. You may get counseling or other support to help you quit.  Avoid cat litter boxes and soil used by cats. These carry germs that can cause birth defects in the baby and can cause a loss of your baby (miscarriage) or stillbirth.  Visit your dentist. At home, brush  your teeth with a soft toothbrush. Be gentle when you floss. Contact a doctor if:  You are dizzy.  You have mild cramps or pressure in your lower belly.  You have a nagging pain in your belly area.  You continue to feel sick to your stomach, you throw up, or you have watery poop (diarrhea).  You have a bad smelling fluid coming from your vagina.  You have pain when you pee (urinate).  You have increased puffiness (swelling) in your face, hands, legs, or ankles. Get help right away if:  You have a fever.  You are leaking fluid from your vagina.  You have spotting or bleeding from your vagina.  You have very bad belly cramping or pain.  You gain or lose weight rapidly.  You throw up blood. It may look like coffee grounds.  You are around people who have German measles, fifth disease, or chickenpox.  You have a very bad headache.  You have shortness of breath.  You have any kind of trauma, such as from a fall or a car accident. Summary  The first trimester of pregnancy is from week 1 until the end of week 13 (months 1 through 3).  To take care of yourself and your unborn baby, you will need to eat healthy meals, take medicines only if your doctor tells you to do so, and do activities that are safe for you and your baby.  Keep all follow-up visits as told by your doctor. This is important as your doctor will have to ensure that your baby is healthy and growing well. This information is not intended to replace advice given to you by your health care provider. Make sure you discuss any questions you have with your health care provider. Document Released: 12/26/2007 Document Revised: 07/17/2016 Document Reviewed: 07/17/2016 Elsevier Interactive Patient Education  2017 [employer]  

## 2017-09-05 NOTE — Progress Notes (Signed)
Gina Lester Shon [place] presents for NOB nurse interview visit. Pregnancy confirmation done at [facility] ___.  G-2 .  P- 1   . Pregnancy education material explained and given. _No cats in the home. NOB labs ordered.   HIV labs and Drug screen were explained optional and she did not decline. Drug screen ordered/ PNV encouraged. Genetic screening options discussed. Genetic testing  Pt may discuss with provider. [facility]17

## 2017-09-05 NOTE — Progress Notes (Signed)
I have reviewed the record and concur with patient management and plan.    Lailie Smead Michelle Mehgan Santmyer, CNM [facility], [facility] 

## 2017-09-06 ENCOUNTER — Encounter: Payer: Self-pay | Admitting: Certified Nurse Midwife

## 2017-09-06 LAB — HIV ANTIBODY (ROUTINE TESTING W REFLEX): HIV Screen 4th Generation wRfx: NONREACTIVE

## 2017-09-06 LAB — RPR: RPR Ser Ql: NONREACTIVE

## 2017-09-06 LAB — URINALYSIS, ROUTINE W REFLEX MICROSCOPIC
Bilirubin, UA: NEGATIVE
Glucose, UA: NEGATIVE
Leukocytes, UA: NEGATIVE
NITRITE UA: NEGATIVE
PH UA: 5.5 (ref 5.0–7.5)
Protein, UA: NEGATIVE
RBC, UA: NEGATIVE
Specific Gravity, UA: 1.028 (ref 1.005–1.030)
UUROB: 1 mg/dL (ref 0.2–1.0)

## 2017-09-06 LAB — CBC WITH DIFFERENTIAL/PLATELET
BASOS ABS: 0 10*3/uL (ref 0.0–0.2)
BASOS: 0 %
EOS (ABSOLUTE): 0.4 10*3/uL (ref 0.0–0.4)
Eos: 5 %
Hematocrit: 40.4 % (ref 34.0–46.6)
Hemoglobin: 13.5 g/dL (ref 11.1–15.9)
IMMATURE GRANS (ABS): 0 10*3/uL (ref 0.0–0.1)
Immature Granulocytes: 1 %
LYMPHS: 25 %
Lymphocytes Absolute: 2.1 10*3/uL (ref 0.7–3.1)
MCH: 28.1 pg (ref 26.6–33.0)
MCHC: 33.4 g/dL (ref 31.5–35.7)
MCV: 84 fL (ref 79–97)
MONOS ABS: 0.6 10*3/uL (ref 0.1–0.9)
Monocytes: 7 %
Neutrophils Absolute: 5.3 10*3/uL (ref 1.4–7.0)
Neutrophils: 62 %
PLATELETS: 258 10*3/uL (ref 150–379)
RBC: 4.8 x10E6/uL (ref 3.77–5.28)
RDW: 14.7 % (ref 12.3–15.4)
WBC: 8.5 10*3/uL (ref 3.4–10.8)

## 2017-09-06 LAB — RUBELLA SCREEN: RUBELLA: 2.09 {index} (ref >0.99)

## 2017-09-06 LAB — ANTIBODY SCREEN: ANTIBODY SCREEN: NEGATIVE

## 2017-09-06 LAB — ABO AND RH: RH TYPE: POSITIVE

## 2017-09-06 LAB — VARICELLA ZOSTER ANTIBODY, IGG: Varicella zoster IgG: <135 index — ABNORMAL LOW (ref >165)

## 2017-09-06 LAB — HEPATITIS B SURFACE ANTIGEN: HEP B S AG: NEGATIVE

## 2017-09-06 LAB — GC/CHLAMYDIA PROBE AMP
CHLAMYDIA, DNA PROBE: NEGATIVE
NEISSERIA GONORRHOEAE BY PCR: NEGATIVE

## 2017-09-07 LAB — MONITOR DRUG PROFILE 14(MW)
AMPHETAMINE SCREEN URINE: NEGATIVE ng/mL
BARBITURATE SCREEN URINE: NEGATIVE ng/mL
BENZODIAZEPINE SCREEN, URINE: NEGATIVE ng/mL
Buprenorphine, Urine: NEGATIVE ng/mL
CANNABINOIDS UR QL SCN: NEGATIVE ng/mL
CREATININE(CRT), U: 217.1 mg/dL (ref 20.0–300.0)
Cocaine (Metab) Scrn, Ur: NEGATIVE ng/mL
Fentanyl, Urine: NEGATIVE pg/mL
METHADONE SCREEN, URINE: NEGATIVE ng/mL
Meperidine Screen, Urine: NEGATIVE ng/mL
OXYCODONE+OXYMORPHONE UR QL SCN: NEGATIVE ng/mL
Opiate Scrn, Ur: NEGATIVE ng/mL
Ph of Urine: 5.7 (ref 4.5–8.9)
Phencyclidine Qn, Ur: NEGATIVE ng/mL
Propoxyphene Scrn, Ur: NEGATIVE ng/mL
SPECIFIC GRAVITY: 1.021
Tramadol Screen, Urine: NEGATIVE ng/mL

## 2017-09-07 LAB — URINE CULTURE

## 2017-09-26 ENCOUNTER — Ambulatory Visit (INDEPENDENT_AMBULATORY_CARE_PROVIDER_SITE_OTHER): Payer: BLUE CROSS/BLUE SHIELD | Admitting: Certified Nurse Midwife

## 2017-09-26 VITALS — BP 104/60 | HR 68 | Wt 137.0 lb

## 2017-09-26 DIAGNOSIS — Z3492 Encounter for supervision of normal pregnancy, unspecified, second trimester: Secondary | ICD-10-CM

## 2017-09-26 LAB — POCT URINALYSIS DIPSTICK
BILIRUBIN UA: NEGATIVE
Blood, UA: NEGATIVE
GLUCOSE UA: NEGATIVE
KETONES UA: 15
Leukocytes, UA: NEGATIVE
Nitrite, UA: NEGATIVE
Protein, UA: NEGATIVE
Spec Grav, UA: 1.01 (ref 1.010–1.025)
UROBILINOGEN UA: 0.2 U/dL
pH, UA: 6.5 (ref 5.0–8.0)

## 2017-09-26 NOTE — Patient Instructions (Addendum)
Abdominal Pain During Pregnancy Belly (abdominal) pain is common during pregnancy. Most of the time, it is not a serious problem. Other times, it can be a sign that something is wrong with the pregnancy. Always tell your doctor if you have belly pain. Follow these instructions at home: Monitor your belly pain for any changes. The following actions may help you feel better:  Do not have sex (intercourse) or put anything in your vagina until you feel better.  Rest until your pain stops.  Drink clear fluids if you feel sick to your stomach (nauseous). Do not eat solid food until you feel better.  Only take medicine as told by your doctor.  Keep all doctor visits as told.  Get help right away if:  You are bleeding, leaking fluid, or pieces of tissue come out of your vagina.  You have more pain or cramping.  You keep throwing up (vomiting).  You have pain when you pee (urinate) or have blood in your pee.  You have a fever.  You do not feel your baby moving as much.  You feel very weak or feel like passing out.  You have trouble breathing, with or without belly pain.  You have a very bad headache and belly pain.  You have fluid leaking from your vagina and belly pain.  You keep having watery poop (diarrhea).  Your belly pain does not go away after resting, or the pain gets worse. This information is not intended to replace advice given to you by your health care provider. Make sure you discuss any questions you have with your health care provider. Document Released: 06/27/2009 Document Revised: 02/15/2016 Document Reviewed: 02/05/2013 Elsevier Interactive Patient Education  2018 [place]. Back Pain in Pregnancy Back pain during pregnancy is common. Back pain may be caused by several factors that are related to changes during your pregnancy. Follow these instructions at home: Managing pain, stiffness, and swelling  If directed, apply ice for sudden (acute) back  pain. ? Put ice in a plastic bag. ? Place a towel between your skin and the bag. ? Leave the ice on for 20 minutes, 2-3 times per day.  If directed, apply heat to the affected area before you exercise: ? Place a towel between your skin and the heat pack or heating pad. ? Leave the heat on for 20-30 minutes. ? Remove the heat if your skin turns bright red. This is especially important if you are unable to feel pain, heat, or cold. You may have a greater risk of getting burned. Activity  Exercise as told by your health care provider. Exercising is the best way to prevent or manage back pain.  Listen to your body when lifting. If lifting hurts, ask for help or bend your knees. This uses your leg muscles instead of your back muscles.  Squat down when picking up something from the floor. Do not bend over.  Only use bed rest as told by your health care provider. Bed rest should only be used for the most severe episodes of back pain. Standing, Sitting, and Lying Down  Do not stand in one place for long periods of time.  Use good posture when sitting. Make sure your head rests over your shoulders and is not hanging forward. Use a pillow on your lower back if necessary.  Try sleeping on your side, preferably the left side, with a pillow or two between your legs. If you are sore after a night's rest, your bed may  be too soft. A firm mattress may provide more support for your back during pregnancy. General instructions  Do not wear high heels.  Eat a healthy diet. Try to gain weight within your health care provider's recommendations.  Use a maternity girdle, elastic sling, or back brace as told by your health care provider.  Take over-the-counter and prescription medicines only as told by your health care provider.  Keep all follow-up visits as told by your health care provider. This is important. This includes any visits with any specialists, such as a physical therapist. Contact a health  care provider if:  Your back pain interferes with your daily activities.  You have increasing pain in other parts of your body. Get help right away if:  You develop numbness, tingling, weakness, or problems with the use of your arms or legs.  You develop severe back pain that is not controlled with medicine.  You have a sudden change in bowel or bladder control.  You develop shortness of breath, dizziness, or you faint.  You develop nausea, vomiting, or sweating.  You have back pain that is a rhythmic, cramping pain similar to labor pains. Labor pain is usually 1-2 minutes apart, lasts for about 1 minute, and involves a bearing down feeling or pressure in your pelvis.  You have back pain and your water breaks or you have vaginal bleeding.  You have back pain or numbness that travels down your leg.  Your back pain developed after you fell.  You develop pain on one side of your back.  You see blood in your urine.  You develop skin blisters in the area of your back pain. This information is not intended to replace advice given to you by your health care provider. Make sure you discuss any questions you have with your health care provider. Document Released: 10/17/2005 Document Revised: 12/15/2015 Document Reviewed: 03/23/2015 Elsevier Interactive Patient Education  2018 [place] for Pregnant Women While you are pregnant, your body will require additional nutrition to help support your growing baby. It is recommended that you consume:  150 additional calories each day during your first trimester.  300 additional calories each day during your second trimester.  300 additional calories each day during your third trimester.  Eating a healthy, well-balanced diet is very important for your health and for your baby's health. You also have a higher need for some vitamins and minerals, such as folic acid, calcium, iron, and vitamin D. What do I need to know about  eating during pregnancy?  Do not try to lose weight or go on a diet during pregnancy.  Choose healthy, nutritious foods. Choose  of a sandwich with a glass of milk instead of a candy bar or a high-calorie sugar-sweetened beverage.  Limit your overall intake of foods that have "empty calories." These are foods that have little nutritional value, such as sweets, desserts, candies, sugar-sweetened beverages, and fried foods.  Eat a variety of foods, especially fruits and vegetables.  Take a prenatal vitamin to help meet the additional needs during pregnancy, specifically for folic acid, iron, calcium, and vitamin D.  Remember to stay active. Ask your health care provider for exercise recommendations that are specific to you.  Practice good food safety and cleanliness, such as washing your hands before you eat and after you prepare raw meat. This helps to prevent foodborne illnesses, such as listeriosis, that can be very dangerous for your baby. Ask your health care provider for more information  about listeriosis. What does 150 extra calories look like? Healthy options for an additional 150 calories each day could be any of the following:  Plain low-fat yogurt (6-8 oz) with  cup of berries.  1 apple with 2 teaspoons of peanut butter.  Cut-up vegetables with  cup of hummus.  Low-fat chocolate milk (8 oz or 1 cup).  1 string cheese with 1 medium orange.   of a peanut butter and jelly sandwich on whole-wheat bread (1 tsp of peanut butter).  For 300 calories, you could eat two of those healthy options each day. What is a healthy amount of weight to gain? The recommended amount of weight for you to gain is based on your pre-pregnancy BMI. If your pre-pregnancy BMI was:  Less than 18 (underweight), you should gain 28-40 lb.  18-24.9 (normal), you should gain 25-35 lb.  25-29.9 (overweight), you should gain 15-25 lb.  Greater than 30 (obese), you should gain 11-20 lb.  What if I am  having twins or multiples? Generally, pregnant women who will be having twins or multiples may need to increase their daily calories by 300-600 calories each day. The recommended range for total weight gain is 25-54 lb, depending on your pre-pregnancy BMI. Talk with your health care provider for specific guidance about additional nutritional needs, weight gain, and exercise during your pregnancy. What foods can I eat? Grains Any grains. Try to choose whole grains, such as whole-wheat bread, oatmeal, or brown rice. Vegetables Any vegetables. Try to eat a variety of colors and types of vegetables to get a full range of vitamins and minerals. Remember to wash your vegetables well before eating. Fruits Any fruits. Try to eat a variety of colors and types of fruit to get a full range of vitamins and minerals. Remember to wash your fruits well before eating. Meats and Other Protein Sources Lean meats, including chicken, [place], fish, and lean cuts of beef, veal, or pork. Make sure that all meats are cooked to "well done." Tofu. Tempeh. Beans. Eggs. Peanut butter and other nut butters. Seafood, such as shrimp, crab, and lobster. If you choose fish, select types that are higher in omega-3 fatty acids, including salmon, herring, mussels, trout, sardines, and pollock. Make sure that all meats are cooked to food-safe temperatures. Dairy Pasteurized milk and milk alternatives. Pasteurized yogurt and pasteurized cheese. Cottage cheese. Sour cream. Beverages Water. Juices that contain 100% fruit juice or vegetable juice. Caffeine-free teas and decaffeinated coffee. Drinks that contain caffeine are okay to drink, but it is better to avoid caffeine. Keep your total caffeine intake to less than 200 mg each day (12 oz of coffee, tea, or soda) or as directed by your health care provider. Condiments Any pasteurized condiments. Sweets and Desserts Any sweets and desserts. Fats and Oils Any fats and oils. The items  listed above may not be a complete list of recommended foods or beverages. Contact your dietitian for more options. What foods are not recommended? Vegetables Unpasteurized (raw) vegetable juices. Fruits Unpasteurized (raw) fruit juices. Meats and Other Protein Sources Cured meats that have nitrates, such as bacon, salami, and hotdogs. Luncheon meats, bologna, or other deli meats (unless they are reheated until they are steaming hot). Refrigerated pate, meat spreads from a meat counter, smoked seafood that is found in the refrigerated section of a store. Raw fish, such as sushi or sashimi. High mercury content fish, such as tilefish, shark, swordfish, and king mackerel. Raw meats, such as tuna or beef tartare. Undercooked  meats and poultry. Make sure that all meats are cooked to food-safe temperatures. Dairy Unpasteurized (raw) milk and any foods that have raw milk in them. Soft cheeses, such as feta, queso blanco, queso fresco, Brie, Camembert cheeses, blue-veined cheeses, and Panela cheese (unless it is made with pasteurized milk, which must be stated on the label). Beverages Alcohol. Sugar-sweetened beverages, such as sodas, teas, or energy drinks. Condiments Homemade fermented foods and drinks, such as pickles, sauerkraut, or kombucha drinks. (Store-bought pasteurized versions of these are okay.) Other Salads that are made in the store, such as ham salad, chicken salad, egg salad, tuna salad, and seafood salad. The items listed above may not be a complete list of foods and beverages to avoid. Contact your dietitian for more information. This information is not intended to replace advice given to you by your health care provider. Make sure you discuss any questions you have with your health care provider. Document Released: 04/23/2014 Document Revised: 12/15/2015 Document Reviewed: 12/22/2013 Elsevier Interactive Patient Education  2018 [employer]. Common Medications Safe in  Pregnancy  Acne:      Constipation:  Benzoyl Peroxide     Colace  Clindamycin      Dulcolax Suppository  Topica Erythromycin     Fibercon  Salicylic Acid      Metamucil         Miralax AVOID:        Senakot   Accutane    Cough:  Retin-A       Cough Drops  Tetracycline      Phenergan w/ Codeine if Rx  Minocycline      Robitussin (Plain & DM)  Antibiotics:     Crabs/Lice:  Ceclor       RID  Cephalosporins    AVOID:  E-Mycins      Kwell  Keflex  Macrobid/Macrodantin   Diarrhea:  Penicillin      Kao-Pectate  Zithromax      Imodium AD         PUSH FLUIDS AVOID:       Cipro     Fever:  Tetracycline      Tylenol (Regular or Extra  Minocycline       Strength)  Levaquin      Extra Strength-Do not          Exceed 8 tabs/24 hrs Caffeine:        <224m/day (equiv. To 1 cup of coffee or  approx. 3 12 oz sodas)         Gas: Cold/Hayfever:       Gas-X  Benadryl      Mylicon  Claritin       Phazyme  **Claritin-D        Chlor-Trimeton    Headaches:  Dimetapp      ASA-Free Excedrin  Drixoral-Non-Drowsy     Cold Compress  Mucinex (Guaifenasin)     Tylenol (Regular or Extra  Sudafed/Sudafed-12 Hour     Strength)  **Sudafed PE Pseudoephedrine   Tylenol Cold & Sinus     Vicks Vapor Rub  Zyrtec  **AVOID if Problems With Blood Pressure         Heartburn: Avoid lying down for at least 1 hour after meals  Aciphex      Maalox     Rash:  Milk of Magnesia     Benadryl    Mylanta       1% Hydrocortisone Cream  Pepcid  Pepcid Complete   Sleep Aids:  Prevacid  Ambien   Prilosec       Benadryl  Rolaids       Chamomile Tea  Tums (Limit 4/day)     Unisom  Zantac       Tylenol PM         Warm milk-add vanilla or  Hemorrhoids:       Sugar for taste  Anusol/Anusol H.C.  (RX: Analapram 2.5%)  Sugar Substitutes:  Hydrocortisone OTC     Ok in moderation  Preparation H      Tucks        Vaseline lotion applied to tissue with  wiping    Herpes:     Throat:  Acyclovir      Oragel  Famvir  Valtrex     Vaccines:         Flu Shot Leg Cramps:       *Gardasil  Benadryl      Hepatitis A         Hepatitis B Nasal Spray:       Pneumovax  Saline Nasal Spray     Polio Booster         Tetanus Nausea:       Tuberculosis test or PPD  Vitamin B6 25 mg TID   AVOID:    Dramamine      *Gardasil  Emetrol       Live Poliovirus  Ginger Root 250 mg QID    MMR (measles, mumps &  High Complex Carbs @ Bedtime    rebella)  Sea Bands-Accupressure    Varicella (Chickenpox)  Unisom 1/2 tab TID     *No known complications           If received before Pain:         Known pregnancy;   Darvocet       Resume series after  Lortab        Delivery  Percocet    Yeast:   Tramadol      Femstat  Tylenol 3      Gyne-lotrimin  Ultram       Monistat  Vicodin           MISC:         All Sunscreens           Hair Coloring/highlights          Insect Repellant's          (Including DEET)         Mystic Tans Second Trimester of Pregnancy The second trimester is from week 13 through week 28, month 4 through 6. This is often the time in pregnancy that you feel your best. Often times, morning sickness has lessened or quit. You may have more energy, and you may get hungry more often. Your unborn baby (fetus) is growing rapidly. At the end of the sixth month, he or she is about 9 inches long and weighs about 1 pounds. You will likely feel the baby move (quickening) between 18 and 20 weeks of pregnancy. Follow these instructions at home:  Avoid all smoking, herbs, and alcohol. Avoid drugs not approved by your doctor.  Do not use any tobacco products, including cigarettes, chewing tobacco, and electronic cigarettes. If you need help quitting, ask your doctor. You may get counseling or other support to help you quit.  Only take medicine as told by your doctor. Some medicines are safe and some are not during pregnancy.  Exercise only as told by your  doctor.  Stop exercising if you start having cramps.  Eat regular, healthy meals.  Wear a good support bra if your breasts are tender.  Do not use hot tubs, steam rooms, or saunas.  Wear your seat belt when driving.  Avoid raw meat, uncooked cheese, and liter boxes and soil used by cats.  Take your prenatal vitamins.  Take 1500-2000 milligrams of calcium daily starting at the 20th week of pregnancy until you deliver your baby.  Try taking medicine that helps you poop (stool softener) as needed, and if your doctor approves. Eat more fiber by eating fresh fruit, vegetables, and whole grains. Drink enough fluids to keep your pee (urine) clear or pale yellow.  Take warm water baths (sitz baths) to soothe pain or discomfort caused by hemorrhoids. Use hemorrhoid cream if your doctor approves.  If you have puffy, bulging veins (varicose veins), wear support hose. Raise (elevate) your feet for 15 minutes, 3-4 times a day. Limit salt in your diet.  Avoid heavy lifting, wear low heals, and sit up straight.  Rest with your legs raised if you have leg cramps or low back pain.  Visit your dentist if you have not gone during your pregnancy. Use a soft toothbrush to brush your teeth. Be gentle when you floss.  You can have sex (intercourse) unless your doctor tells you not to.  Go to your doctor visits. Get help if:  You feel dizzy.  You have mild cramps or pressure in your lower belly (abdomen).  You have a nagging pain in your belly area.  You continue to feel sick to your stomach (nauseous), throw up (vomit), or have watery poop (diarrhea).  You have bad smelling fluid coming from your vagina.  You have pain with peeing (urination). Get help right away if:  You have a fever.  You are leaking fluid from your vagina.  You have spotting or bleeding from your vagina.  You have severe belly cramping or pain.  You lose or gain weight rapidly.  You have trouble catching your  breath and have chest pain.  You notice sudden or extreme puffiness (swelling) of your face, hands, ankles, feet, or legs.  You have not felt the baby move in over an hour.  You have severe headaches that do not go away with medicine.  You have vision changes. This information is not intended to replace advice given to you by your health care provider. Make sure you discuss any questions you have with your health care provider. Document Released: 10/03/2009 Document Revised: 12/15/2015 Document Reviewed: 09/09/2012 Elsevier Interactive Patient Education  2017 [place]. Skin Conditions During Pregnancy Pregnancy affects many parts of your body. One part is your skin. Most skin problems that develop during pregnancy are not serious and are considered a normal part of pregnancy. They go away on their own after the baby is born. Other skin problems may need treatment. What type of skin problems can develop during pregnancy?  Stretch marks. Stretch marks are purple or pink lines on the skin. They may appear on the belly, breasts, thighs, or buttocks. Stretch marks are caused by weight gain that causes the skin to stretch. Stretch marks do not cause problems. Almost all women get them during pregnancy.  Darkening of the skin (hyperpigmentation). The darkening may occur in patches or as a line. Patches may appear on the face, nipples, or genital area. Lines often stretch from the belly button to the pubic area. Hyperpigmentation develops in almost all pregnant women.  It is more severe in women with a dark complexion.  Spider angiomas. These are tiny pink or red lines that go out from a center point, like the legs of a spider. Usually, they are on the face, neck, and arms. They do not cause problems. They are most common in women with light complexions.  Palmar erythema. This is a reddening of the palms. It is most common in women with light complexions.  Swelling and redness. This can occur on  the face, eyelids, fingers, or toes.  Pruritic urticarial papules and plaques of pregnancy (PUPPP). This is a rash that is itchy, red, and has tiny blisters. The cause is unknown. It usually starts on the abdomen and may affect the arms or legs. It does not affect the face. It usually begins later in pregnancy. About a third of all pregnant women develop this condition. There are no associated problems to the fetus with this rash. Sometimes, oral steroids are used to calm down the itch. The rash clears after the baby is born.  Prurigo of pregnancy. This is a disease in which red patches and bumps appear on the arms and legs. The cause is unknown. The patches and bumps clear after the baby is born. About a third of pregnant women develop this disease.  Acne. Pimples may develop, including in women who have had clear skin for a long time.  Skin tags. These are small flaps of skin that stick out from the body. They may grow or become darker during pregnancy. They are usually harmless.  Moles. These are flat or slightly raised growths. They are usually round and pink or brown. They may grow or become darker during pregnancy.  Intrahepatic cholestasis of pregnancy. This is a rare condition that causes itchy skin. It may run in families. It increases the risk of complications for the fetus. This condition usually resolves after delivery. It can recur with subsequent pregnancies.  Impetigo herpetiformis. This is a form of a severe skin disease called pustular psoriasis. Usually, delivery is the only method of resolving the condition.  Pruritic folliculitis of pregnancy. This is a rare condition that causes pimple-like skin growths. It develops in the middle or later stages of pregnancy. Its cause is unknown.It usually resolves 2-3 weeks after delivery.  Pemphigoid gestationis. This is a very rare autoimmune disease. It causes a severely itchy rash and blisters. The rash does not appear on the face,  scalp, or inside of the mouth. It usually resolves 3 months after delivery. It may recur with subsequent pregnancies. Some pre-existing skin conditions, such as atopic dermatitis, may become worse during pregnancy. Follow these instructions at home: Different conditions may have different instructions. In general:  Follow all your health care provider's directions about medicines to treat skin problems while you are pregnant. Do not use any over-the-counter medicines (including medicated creams and lotions) until you have checked with your health care provider. Many medicines are not safe to use when you are pregnant.  Avoid time in the sun. This will help keep your skin from darkening. When you must be outside, use sunscreen and wear a hat with a wide brim to protect your face. The sunscreen should have a SPF of at least 69. This may help limit dark spots that develop when the skin is exposed to the sun.  To avoid problems from stretched skin: ? Do not sit or stand for long periods of time. ? Exercise regularly. This helps keep your skin in good condition.  Use a gentle soap. This helps prevent acne.  Do not get too hot or too sweaty. This makes some skin rashes worse.  Wear loose clothes made of a soft fabric. This prevents skin irritation.  For itching, add oatmeal or cornstarch to your bathwater.  Use a skin moisturizer. Ask your health care provider for suggestions.  This information is not intended to replace advice given to you by your health care provider. Make sure you discuss any questions you have with your health care provider. Document Released: 08/11/2010 Document Revised: 12/15/2015 Document Reviewed: 04/20/2013 Elsevier Interactive Patient Education  [employer].

## 2017-09-26 NOTE — Progress Notes (Signed)
NOB - pt is doing well[facility]

## 2017-09-26 NOTE — Progress Notes (Signed)
NEW OB HISTORY AND PHYSICAL  SUBJECTIVE:       Gina Lester is a 22 y.o. G2P1000 female, Patient's last menstrual period was 07/04/2017., Estimated Date of Delivery: 04/10/18, [redacted]w[redacted]d, presents today for establishment of Prenatal Care.  She has no unusual complaints. Endorses resolving nausea.   Denies difficulty breathing or respiratory distress, chest pain, abdominal pain, vaginal bleeding, dysuria, and leg pain or swelling.   Requests Panorama for genetic screening.    Gynecologic History  Patient's last menstrual period was 07/04/2017.   Contraception: none  Last Pap: N/A.   Obstetric History OB History  Gravida Para Term Preterm AB Living  2 1 1      0  SAB TAB Ectopic Multiple Live Births        0      # Outcome Date GA Lbr Len/2nd Weight Sex Delivery Anes PTL Lv  2 Current           1 Term 12/11/15 [redacted]w[redacted]d / 00:33 7 lb 7.2 oz (3.38 kg) M Vag-Spont None        Past Medical History:  Diagnosis Date  . Constipation   . Seasonal allergies     Past Surgical History:  Procedure Laterality Date  . NO PAST SURGERIES      Current Outpatient Medications on File Prior to Visit  Medication Sig Dispense Refill  . Prenatal Vit-Fe Fumarate-FA (PRENATAL MULTIVITAMIN) TABS tablet Take 1 tablet by mouth daily at 12 noon.     No current facility-administered medications on file prior to visit.     No Known Allergies  Social History   Socioeconomic History  . Marital status: Married    Spouse name: Not on file  . Number of children: Not on file  . Years of education: Not on file  . Highest education level: Not on file  Social Needs  . Financial resource strain: Not on file  . Food insecurity - worry: Not on file  . Food insecurity - inability: Not on file  . Transportation needs - medical: Not on file  . Transportation needs - non-medical: Not on file  Occupational History  . 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 Smoking status: Current Every Day Smoker     Types: Cigarettes  . Smokeless tobacco: Never Used  Substance and Sexual Activity  . Alcohol use: Yes  . Drug use: No  . Sexual activity: Yes    Birth control/protection: None  Other Topics Concern  . Not on file  Social History Narrative       Family History  Problem Relation Age of Onset  . Gallbladder disease Mother     The following portions of the patient's history were reviewed and updated as appropriate: allergies, current medications, past OB history, past medical history, past surgical history, past family history, past social history, and problem list.    OBJECTIVE: BP 104/60   Pulse 68   Wt 137 lb (62.1 kg)   LMP 07/04/2017   BMI 26.76 kg/m   Initial Physical Exam (New OB)  GENERAL APPEARANCE: alert, well appearing, in no apparent distress  HEAD: normocephalic, atraumatic  MOUTH: mucous membranes moist, pharynx normal without lesions  THYROID: no thyromegaly or masses present  BREASTS: deferred, no complaints  LUNGS: clear to auscultation, no wheezes, rales or rhonchi, symmetric air entry  HEART: regular rate and rhythm, no murmurs  ABDOMEN: soft, nontender, nondistended, no abnormal masses, no epigastric pain, FHT present  EXTREMITIES: no redness or tenderness in the calves  or thighs, no edema  SKIN: normal coloration and turgor, no rashes  LYMPH NODES: no adenopathy palpable  NEUROLOGIC: alert, oriented, normal speech, no focal findings or movement disorder noted  PELVIC EXAM: not indicated  ASSESSMENT: Normal pregnancy Desires genetic screening  PLAN: Prenatal care New OB counseling: The patient has been given an overview regarding routine prenatal care. Recommendations regarding diet, weight gain, and exercise in pregnancy were given. Prenatal testing, optional genetic testing, and ultrasound use in pregnancy were reviewed.  Benefits of Breast Feeding were discussed. The patient is encouraged to consider nursing her baby post  partum.   Gunnar BullaJenkins Michelle Lawhorn, CNM [facility], [facility] See orders

## 2017-10-09 ENCOUNTER — Encounter: Payer: Self-pay | Admitting: Certified Nurse Midwife

## 2017-10-14 ENCOUNTER — Ambulatory Visit (INDEPENDENT_AMBULATORY_CARE_PROVIDER_SITE_OTHER): Payer: BLUE CROSS/BLUE SHIELD | Admitting: Certified Nurse Midwife

## 2017-10-14 ENCOUNTER — Encounter: Payer: Self-pay | Admitting: Certified Nurse Midwife

## 2017-10-14 ENCOUNTER — Telehealth: Payer: Self-pay | Admitting: Certified Nurse Midwife

## 2017-10-14 VITALS — BP 96/56 | HR 71 | Wt 134.7 lb

## 2017-10-14 DIAGNOSIS — R109 Unspecified abdominal pain: Secondary | ICD-10-CM

## 2017-10-14 DIAGNOSIS — O26892 Other specified pregnancy related conditions, second trimester: Secondary | ICD-10-CM | POA: Diagnosis not present

## 2017-10-14 DIAGNOSIS — Z3492 Encounter for supervision of normal pregnancy, unspecified, second trimester: Secondary | ICD-10-CM

## 2017-10-14 DIAGNOSIS — H9212 Otorrhea, left ear: Secondary | ICD-10-CM | POA: Diagnosis not present

## 2017-10-14 DIAGNOSIS — O26899 Other specified pregnancy related conditions, unspecified trimester: Secondary | ICD-10-CM

## 2017-10-14 DIAGNOSIS — M545 Low back pain: Secondary | ICD-10-CM

## 2017-10-14 LAB — POCT URINALYSIS DIPSTICK
Blood, UA: NEGATIVE
Glucose, UA: NEGATIVE
KETONES UA: NEGATIVE
LEUKOCYTES UA: NEGATIVE
NITRITE UA: NEGATIVE
Spec Grav, UA: 1.01 (ref 1.010–1.025)
Urobilinogen, UA: 0.2 E.U./dL
pH, UA: 8 (ref 5.0–8.0)

## 2017-10-14 MED ORDER — LORATADINE 10 MG PO TABS: 10.0000 mg | ORAL_TABLET | Freq: Every day | ORAL | 0 refills | Status: DC

## 2017-10-14 MED ORDER — CEFDINIR 300 MG PO CAPS: 300.0000 mg | ORAL_CAPSULE | Freq: Two times a day (BID) | ORAL | 0 refills | Status: AC

## 2017-10-14 NOTE — Patient Instructions (Signed)
Ear Drainage Ear drainage means that ear wax, pus, blood, or other fluid comes out of the ear (discharge). Follow these instructions at home: Pay attention to any changes in your ear drainage. Take these actions to help with your condition:  Take over-the-counter and prescription medicines only as told by your doctor.  Do not use cotton-tipped swabs in your ear. Do not put any other objects in your ear.  Do not swim until your doctor says it is okay.  Before you shower, cover a cotton ball with petroleum jelly and put that in your ear. This helps to keep water out of your ear.  Avoid being around smoke.  Wash your hands before and after you touch your ears.  Keep all follow-up visits as told by your doctor. This is important.  Contact a doctor if:  You have more drainage.  You have ear pain.  You have a fever.  Your drainage is not getting better with treatment.  Your ear drainage is bloody, white, clear, or yellow.  Your ear is red or swollen. Get help right away if:  You have very bad ear pain.  You have a very bad headache.  You throw up (vomit).  You feel dizzy.  You have a seizure.  You have new hearing loss. This information is not intended to replace advice given to you by your health care provider. Make sure you discuss any questions you have with your health care provider. Document Released: 12/27/2009 Document Revised: 12/15/2015 Document Reviewed: 10/12/2014 Elsevier Interactive Patient Education  2018 [employer]. Common Medications Safe in Pregnancy  Acne:      Constipation:  Benzoyl Peroxide     Colace  Clindamycin      Dulcolax Suppository  Topica Erythromycin     Fibercon  Salicylic Acid      Metamucil         Miralax AVOID:        Senakot   Accutane    Cough:  Retin-A       Cough Drops  Tetracycline      Phenergan w/ Codeine if Rx  Minocycline      Robitussin (Plain &  DM)  Antibiotics:     Crabs/Lice:  Ceclor       RID  Cephalosporins    AVOID:  E-Mycins      Kwell  Keflex  Macrobid/Macrodantin   Diarrhea:  Penicillin      Kao-Pectate  Zithromax      Imodium AD         PUSH FLUIDS AVOID:       Cipro     Fever:  Tetracycline      Tylenol (Regular or Extra  Minocycline       Strength)  Levaquin      Extra Strength-Do not          Exceed 8 tabs/24 hrs Caffeine:        <266m/day (equiv. To 1 cup of coffee or  approx. 3 12 oz sodas)         Gas: Cold/Hayfever:       Gas-X  Benadryl      Mylicon  Claritin       Phazyme  **Claritin-D        Chlor-Trimeton    Headaches:  Dimetapp      ASA-Free Excedrin  Drixoral-Non-Drowsy     Cold Compress  Mucinex (Guaifenasin)     Tylenol (Regular or Extra  Sudafed/Sudafed-12 Hour     Strength)  **  Sudafed PE Pseudoephedrine   Tylenol Cold & Sinus     Vicks Vapor Rub  Zyrtec  **AVOID if Problems With Blood Pressure         Heartburn: Avoid lying down for at least 1 hour after meals  Aciphex      Maalox     Rash:  Milk of Magnesia     Benadryl    Mylanta       1% Hydrocortisone Cream  Pepcid  Pepcid Complete   Sleep Aids:  Prevacid      Ambien   Prilosec       Benadryl  Rolaids       Chamomile Tea  Tums (Limit 4/day)     Unisom  Zantac       Tylenol PM         Warm milk-add vanilla or  Hemorrhoids:       Sugar for taste  Anusol/Anusol H.C.  (RX: Analapram 2.5%)  Sugar Substitutes:  Hydrocortisone OTC     Ok in moderation  Preparation H      Tucks        Vaseline lotion applied to tissue with wiping    Herpes:     Throat:  Acyclovir      Oragel  Famvir  Valtrex     Vaccines:         Flu Shot Leg Cramps:       *Gardasil  Benadryl      Hepatitis A         Hepatitis B Nasal Spray:       Pneumovax  Saline Nasal Spray     Polio Booster         Tetanus Nausea:       Tuberculosis test or PPD  Vitamin B6 25 mg TID   AVOID:    Dramamine      *Gardasil  Emetrol       Live  Poliovirus  Ginger Root 250 mg QID    MMR (measles, mumps &  High Complex Carbs @ Bedtime    rebella)  Sea Bands-Accupressure    Varicella (Chickenpox)  Unisom 1/2 tab TID     *No known complications           If received before Pain:         Known pregnancy;   Darvocet       Resume series after  Lortab        Delivery  Percocet    Yeast:   Tramadol      Femstat  Tylenol 3      Gyne-lotrimin  Ultram       Monistat  Vicodin           MISC:         All Sunscreens           Hair Coloring/highlights          Insect Repellant's          (Including DEET)         Mystic Tans Abdominal Pain During Pregnancy Belly (abdominal) pain is common during pregnancy. Most of the time, it is not a serious problem. Other times, it can be a sign that something is wrong with the pregnancy. Always tell your doctor if you have belly pain. Follow these instructions at home: Monitor your belly pain for any changes. The following actions may help you feel better:  Do not have sex (intercourse) or put anything in your vagina until  you feel better.  Rest until your pain stops.  Drink clear fluids if you feel sick to your stomach (nauseous). Do not eat solid food until you feel better.  Only take medicine as told by your doctor.  Keep all doctor visits as told.  Get help right away if:  You are bleeding, leaking fluid, or pieces of tissue come out of your vagina.  You have more pain or cramping.  You keep throwing up (vomiting).  You have pain when you pee (urinate) or have blood in your pee.  You have a fever.  You do not feel your baby moving as much.  You feel very weak or feel like passing out.  You have trouble breathing, with or without belly pain.  You have a very bad headache and belly pain.  You have fluid leaking from your vagina and belly pain.  You keep having watery poop (diarrhea).  Your belly pain does not go away after resting, or the pain gets worse. This information is  not intended to replace advice given to you by your health care provider. Make sure you discuss any questions you have with your health care provider. Document Released: 06/27/2009 Document Revised: 02/15/2016 Document Reviewed: 02/05/2013 Elsevier Interactive Patient Education  2018 [place]. Back Pain in Pregnancy Back pain during pregnancy is common. Back pain may be caused by several factors that are related to changes during your pregnancy. Follow these instructions at home: Managing pain, stiffness, and swelling  If directed, apply ice for sudden (acute) back pain. ? Put ice in a plastic bag. ? Place a towel between your skin and the bag. ? Leave the ice on for 20 minutes, 2-3 times per day.  If directed, apply heat to the affected area before you exercise: ? Place a towel between your skin and the heat pack or heating pad. ? Leave the heat on for 20-30 minutes. ? Remove the heat if your skin turns bright red. This is especially important if you are unable to feel pain, heat, or cold. You may have a greater risk of getting burned. Activity  Exercise as told by your health care provider. Exercising is the best way to prevent or manage back pain.  Listen to your body when lifting. If lifting hurts, ask for help or bend your knees. This uses your leg muscles instead of your back muscles.  Squat down when picking up something from the floor. Do not bend over.  Only use bed rest as told by your health care provider. Bed rest should only be used for the most severe episodes of back pain. Standing, Sitting, and Lying Down  Do not stand in one place for long periods of time.  Use good posture when sitting. Make sure your head rests over your shoulders and is not hanging forward. Use a pillow on your lower back if necessary.  Try sleeping on your side, preferably the left side, with a pillow or two between your legs. If you are sore after a night's rest, your bed may be too soft. A  firm mattress may provide more support for your back during pregnancy. General instructions  Do not wear high heels.  Eat a healthy diet. Try to gain weight within your health care provider's recommendations.  Use a maternity girdle, elastic sling, or back brace as told by your health care provider.  Take over-the-counter and prescription medicines only as told by your health care provider.  Keep all follow-up visits as told  by your health care provider. This is important. This includes any visits with any specialists, such as a physical therapist. Contact a health care provider if:  Your back pain interferes with your daily activities.  You have increasing pain in other parts of your body. Get help right away if:  You develop numbness, tingling, weakness, or problems with the use of your arms or legs.  You develop severe back pain that is not controlled with medicine.  You have a sudden change in bowel or bladder control.  You develop shortness of breath, dizziness, or you faint.  You develop nausea, vomiting, or sweating.  You have back pain that is a rhythmic, cramping pain similar to labor pains. Labor pain is usually 1-2 minutes apart, lasts for about 1 minute, and involves a bearing down feeling or pressure in your pelvis.  You have back pain and your water breaks or you have vaginal bleeding.  You have back pain or numbness that travels down your leg.  Your back pain developed after you fell.  You develop pain on one side of your back.  You see blood in your urine.  You develop skin blisters in the area of your back pain. This information is not intended to replace advice given to you by your health care provider. Make sure you discuss any questions you have with your health care provider. Document Released: 10/17/2005 Document Revised: 12/15/2015 Document Reviewed: 03/23/2015 Elsevier Interactive Patient Education  [employer].

## 2017-10-14 NOTE — Telephone Encounter (Signed)
The patient called and stated that she would like to speak with Marcelino DusterMichelle in regards to the patient experiencing severe lower back pain and lower abdominal pain. Please advise.

## 2017-10-14 NOTE — Telephone Encounter (Signed)
Called pt appt made for 10/14/17

## 2017-10-14 NOTE — Telephone Encounter (Signed)
Please contact. Thanks, JML

## 2017-10-14 NOTE — Progress Notes (Signed)
OB WORK IN- low back pain, cramping in her abdomen

## 2017-10-15 ENCOUNTER — Encounter: Payer: Self-pay | Admitting: Certified Nurse Midwife

## 2017-10-16 ENCOUNTER — Encounter: Payer: Self-pay | Admitting: Certified Nurse Midwife

## 2017-10-16 NOTE — Progress Notes (Signed)
Subjective:   Gina Lester is a 22 y.o. G2P1000 1764w4d being seen today for work in problem obstetrical visit.  Patient reports intermittent back pain, left sided pain and cramping with movement, and left ear drainage since Saturday. No relief with home treatment measures.   Denies contractions, vaginal bleeding or leaking of fluid.  Reports good fetal movement.  Denies difficulty breathing or respiratory distress, chest pain, vaginal bleeding, dysuria, and leg pain or swelling.   The following portions of the patient's history were reviewed and updated as appropriate: allergies, current medications, past family history, past medical history, past social history, past surgical history and problem list.   Objective:   BP (!) 96/56   Pulse 71   Wt 134 lb 11.2 oz (61.1 kg)   LMP 07/04/2017   BMI 26.31 kg/m   FHT: Fetal Heart Rate (bpm): 159  Fetal Movement: Movement: Absent    Ears:  clear, white drainage from left ear; discomfort noted with exam  Abdomen:  soft, gravid, appropriate for gestational age,non-tender   No results found for this or any previous visit (from the past 24 hour(s)).  Assessment and Plan:   Pregnancy:  G2P1000 at 1780w6d  1. Second trimester pregnancy  - POCT urinalysis dipstick - Urine Culture  2. Low back pain during pregnancy in second trimester  - Urine Culture  3. Abdominal cramping affecting pregnancy  - Urine Culture  4. Drainage from left ear  Assessment and Plan:   Labs: Urine culture, see orders.   Discussed home treatment measures for round ligament pain and back pain in pregnancy.   Rx: Omicef and claritin, see orders.   Preterm labor symptoms: vaginal bleeding, contractions and leaking of fluid reviewed in detail.    Follow up as previously scheduled.    Gina Lester Gina Lester, CNM [facility], [facility]

## 2017-10-24 ENCOUNTER — Ambulatory Visit (INDEPENDENT_AMBULATORY_CARE_PROVIDER_SITE_OTHER): Payer: BLUE CROSS/BLUE SHIELD | Admitting: Obstetrics and Gynecology

## 2017-10-24 VITALS — BP 105/63 | HR 75 | Wt 135.8 lb

## 2017-10-24 DIAGNOSIS — Z3492 Encounter for supervision of normal pregnancy, unspecified, second trimester: Secondary | ICD-10-CM

## 2017-10-24 LAB — POCT URINALYSIS DIPSTICK
Glucose, UA: NEGATIVE
KETONES UA: 5
Leukocytes, UA: NEGATIVE
Nitrite, UA: NEGATIVE
PH UA: 6 (ref 5.0–8.0)
Protein, UA: NEGATIVE
RBC UA: NEGATIVE
SPEC GRAV UA: 1.015 (ref 1.010–1.025)
UROBILINOGEN UA: 0.2 U/dL

## 2017-10-24 NOTE — Progress Notes (Signed)
ROB-having back pains and cramping in lower back. Hasn't been able to work. [facility] Need work note.

## 2017-10-24 NOTE — Progress Notes (Signed)
ROB- pt is still having low back pain, states she has missed work for 2 weeks due to this- request note

## 2017-10-24 NOTE — Patient Instructions (Signed)
Dolor de Merchandiser, retail (Back Pain in Pregnancy) El dolor de espalda es habitual durante el embarazo. Puede deberse a varios factores relacionados con los cambios durante esta etapa. INSTRUCCIONES PARA EL CUIDADO EN EL HOGAR Control del dolor, la rigidez y la hinchazn  Si se lo indican, aplique hielo en caso de dolor de espalda repentino (agudo). ? Ponga el hielo en una bolsa plstica. ? Coloque una [employer] piel y la bolsa de hielo. ? Coloque el hielo durante , 2 a 3veces por da.  Si se lo indican, aplique calor en la zona afectada antes de realizar ejercicios: ? Coloque una toalla entre la piel y la compresa de calor o la almohadilla trmica. ? Aplique el calor durante 20 a . ? Retire la fuente de calor si la piel se le pone de color rojo brillante. Esto es muy importante si no puede sentir el dolor, el calor o el fro. Puede correr un riesgo mayor de sufrir quemaduras. Actividad  Haga ejercicio como se lo haya indicado el mdico. Hacer ejercicio es la mejor forma de Automotive engineer o Human resources officer de espalda.  Prstele atencin a su cuerpo cuando se levante. Si siente dolor al levantarse, pida ayuda o flexione las rodillas. [employer], se usan los msculos de las piernas en lugar de los de la espalda.  Pngase en cuclillas al levantar algo del suelo. No se agache.  Haga reposo en cama nicamente como se lo haya indicado el mdico. El reposo en cama solo debe hacerse cuando los episodios de dolor de espalda son ms intensos. Pararse, sentarse y acostarse  No permanezca sentada o de pie en el mismo lugar durante largos perodos.  Cuando est sentada, adopte una Education officer, museum. Asegrese de que su cabeza descansa sobre sus hombros y no est colgando hacia delante. Use una almohada en la parte inferior de la espalda si es necesario.  Trate de dormir de lado, de preferencia el lado izquierdo, con una o [employer] piernas. Si est  dolorida despus de una noche de descanso, la cama puede ser [employer]. Un colchn duro puede brindarle ms apoyo para la Merchandiser, retail. Instrucciones generales  No use zapatos con tacones altos.  Consumir una dieta saludable. Trate de aumentar de peso dentro de las recomendaciones del mdico.  Use una faja de maternidad, un arns elstico o un cors para la espalda como se lo haya indicado el mdico.  Tome los medicamentos de venta libre y los recetados solamente como se lo haya indicado el mdico.  Oceanographer a todas las visitas de control como se lo haya indicado el mdico. Esto es importante. Incluye las visitas a los especialistas, como un fisioterapeuta. SOLICITE ATENCIN MDICA SI:  El dolor de espalda le impide realizar las actividades cotidianas.  Aumenta el dolor en otras partes del cuerpo. SOLICITE ATENCIN MDICA DE INMEDIATO SI:  Siente entumecimiento, hormigueo, debilidad o problemas con el uso de los brazos o las piernas.  Siente un dolor de espalda intenso que no puede controlar con los medicamentos.  Tiene modificaciones repentinas en el control de la vejiga o el intestino.  Siente que le falta el aire, se marea o se desmaya.  Tiene nuseas, vmitos o sudoracin.  Siente un dolor de espalda que es rtmico y de tipo clico, similar a las contracciones del [place]. Las contracciones del parto suelen aparecer cada 1 a , duran aproximadamente y estn acompaadas de una sensacin de empujar o  de presin en la pelvis.  Tiene dolor de espalda y rompe la bolsa de las aguas o tiene sangrado vaginal.  El dolor o el adormecimiento se extienden hacia la pierna.  El dolor aparece despus de una cada.  Siente dolor de un solo lado.  Observa sangre en la orina.  Le aparecen ampollas en la piel en la zona del dolor de espalda. Esta informacin no tiene Theme park managercomo fin reemplazar el consejo del mdico. Asegrese de hacerle al mdico cualquier  pregunta que tenga. Document Released: 03/21/2011 Document Revised: 10/31/2015 Document Reviewed: 03/23/2015 Elsevier Interactive Patient Education  [employer]2018 Elsevier Inc.

## 2017-11-21 ENCOUNTER — Ambulatory Visit (INDEPENDENT_AMBULATORY_CARE_PROVIDER_SITE_OTHER): Payer: BLUE CROSS/BLUE SHIELD

## 2017-11-21 ENCOUNTER — Ambulatory Visit (INDEPENDENT_AMBULATORY_CARE_PROVIDER_SITE_OTHER): Payer: BLUE CROSS/BLUE SHIELD | Admitting: Certified Nurse Midwife

## 2017-11-21 VITALS — BP 98/58 | HR 70 | Wt 137.6 lb

## 2017-11-21 DIAGNOSIS — Z3492 Encounter for supervision of normal pregnancy, unspecified, second trimester: Secondary | ICD-10-CM

## 2017-11-21 LAB — POCT URINALYSIS DIPSTICK
BILIRUBIN UA: NEGATIVE
GLUCOSE UA: NEGATIVE
Ketones, UA: NEGATIVE
LEUKOCYTES UA: NEGATIVE
Nitrite, UA: NEGATIVE
RBC UA: NEGATIVE
SPEC GRAV UA: 1.015 (ref 1.010–1.025)
Urobilinogen, UA: 0.2 E.U./dL
pH, UA: 6 (ref 5.0–8.0)

## 2017-11-21 NOTE — Progress Notes (Signed)
ROB-Doing well, reports single episode of spotting last Friday while working. Nothing since. Discussed home treatment measures for round ligament pain including use of abdominal support. Anatomy scan normal and complete, findings viewed with patient. Reviewed red flag symptoms and when to call. RTC x 4 weeks for ROB or sooner if needed.   ULTRASOUND REPORT  Location: [facility] Date of Service:  11/21/2017  Indications: Anatomy Findings:  Singleton intrauterine pregnancy is visualized with FHR at 147 BPM. Biometrics give an (U/S) Gestational age of 44 3/7 weeks and an (U/S) EDD of 04/14/18; this correlates with the clinically established EDD of 04/10/18.  Fetal presentation is breech.  EFW: 304 grams (0lb 11oz). Placenta: Posterior and grade 1. AFI: WNL subjectively.  Anatomic survey is complete and appears WNL; Gender - Female.   Right Ovary measures 2.1 x 1.8 x 1.6 cm. It is normal in appearance. Left Ovary measures 2.0 x 1.3 x 1.4 cm. It is normal appearance. There is no obvious evidence of a corpus luteal cyst. Survey of the adnexa demonstrates no adnexal masses. There is no free peritoneal fluid in the cul de sac.  Impression: 1. 19 3/7 week Viable Singleton Intrauterine pregnancy by U/S. 2. (U/S) EDD is consistent with Clinically established (LMP) EDD of 04/10/18. 3. Normal Anatomy Scan  Recommendations: 1.Clinical correlation with the patient's History and Physical Exam.

## 2017-11-21 NOTE — Progress Notes (Signed)
Pt is here for an ROB visit. Had anatomy scan. States she had spotting x1.

## 2017-11-21 NOTE — Patient Instructions (Addendum)
Common Medications Safe in Pregnancy  Acne:      Constipation:  Benzoyl Peroxide     Colace  Clindamycin      Dulcolax Suppository  Topica Erythromycin     Fibercon  Salicylic Acid      Metamucil         Miralax AVOID:        Senakot   Accutane    Cough:  Retin-A       Cough Drops  Tetracycline      Phenergan w/ Codeine if Rx  Minocycline      Robitussin (Plain & DM)  Antibiotics:     Crabs/Lice:  Ceclor       RID  Cephalosporins    AVOID:  E-Mycins      Kwell  Keflex  Macrobid/Macrodantin   Diarrhea:  Penicillin      Kao-Pectate  Zithromax      Imodium AD         PUSH FLUIDS AVOID:       Cipro     Fever:  Tetracycline      Tylenol (Regular or Extra  Minocycline       Strength)  Levaquin      Extra Strength-Do not          Exceed 8 tabs/24 hrs Caffeine:        <200mg/day (equiv. To 1 cup of coffee or  approx. 3 12 oz sodas)         Gas: Cold/Hayfever:       Gas-X  Benadryl      Mylicon  Claritin       Phazyme  **Claritin-D        Chlor-Trimeton    Headaches:  Dimetapp      ASA-Free Excedrin  Drixoral-Non-Drowsy     Cold Compress  Mucinex (Guaifenasin)     Tylenol (Regular or Extra  Sudafed/Sudafed-12 Hour     Strength)  **Sudafed PE Pseudoephedrine   Tylenol Cold & Sinus     Vicks Vapor Rub  Zyrtec  **AVOID if Problems With Blood Pressure         Heartburn: Avoid lying down for at least 1 hour after meals  Aciphex      Maalox     Rash:  Milk of Magnesia     Benadryl    Mylanta       1% Hydrocortisone Cream  Pepcid  Pepcid Complete   Sleep Aids:  Prevacid      Ambien   Prilosec       Benadryl  Rolaids       Chamomile Tea  Tums (Limit 4/day)     Unisom  Zantac       Tylenol PM         Warm milk-add vanilla or  Hemorrhoids:       Sugar for taste  Anusol/Anusol H.C.  (RX: Analapram 2.5%)  Sugar Substitutes:  Hydrocortisone OTC     Ok in moderation  Preparation H      Tucks        Vaseline lotion applied to tissue with  wiping    Herpes:     Throat:  Acyclovir      Oragel  Famvir  Valtrex     Vaccines:         Flu Shot Leg Cramps:       *Gardasil  Benadryl      Hepatitis A         Hepatitis B Nasal Spray:         Pneumovax  Saline Nasal Spray     Polio Booster         Tetanus Nausea:       Tuberculosis test or PPD  Vitamin B6 25 mg TID   AVOID:    Dramamine      *Gardasil  Emetrol       Live Poliovirus  Ginger Root 250 mg QID    MMR (measles, mumps &  High Complex Carbs @ Bedtime    rebella)  Sea Bands-Accupressure    Varicella (Chickenpox)  Unisom 1/2 tab TID     *No known complications           If received before Pain:         Known pregnancy;   Darvocet       Resume series after  Lortab        Delivery  Percocet    Yeast:   Tramadol      Femstat  Tylenol 3      Gyne-lotrimin  Ultram       Monistat  Vicodin           MISC:         All Sunscreens           Hair Coloring/highlights          Insect Repellant's          (Including DEET)         Mystic Tans Round Ligament Pain The round ligament is a cord of muscle and tissue that helps to support the uterus. It can become a source of pain during pregnancy if it becomes stretched or twisted as the baby grows. The pain usually begins in the second trimester of pregnancy, and it can come and go until the baby is delivered. It is not a serious problem, and it does not cause harm to the baby. Round ligament pain is usually a short, sharp, and pinching pain, but it can also be a dull, lingering, and aching pain. The pain is felt in the lower side of the abdomen or in the groin. It usually starts deep in the groin and moves up to the outside of the hip area. Pain can occur with:  A sudden change in position.  Rolling over in bed.  Coughing or sneezing.  Physical activity.  Follow these instructions at home: Watch your condition for any changes. Take these steps to help with your pain:  When the pain starts, relax. Then try: ? Sitting  down. ? Flexing your knees up to your abdomen. ? Lying on your side with one pillow under your abdomen and another pillow between your legs. ? Sitting in a warm bath for 15-20 minutes or until the pain goes away.  Take over-the-counter and prescription medicines only as told by your health care provider.  Move slowly when you sit and stand.  Avoid long walks if they cause pain.  Stop or lessen your physical activities if they cause pain.  Contact a health care provider if:  Your pain does not go away with treatment.  You feel pain in your back that you did not have before.  Your medicine is not helping. Get help right away if:  You develop a fever or chills.  You develop uterine contractions.  You develop vaginal bleeding.  You develop nausea or vomiting.  You develop diarrhea.  You have pain when you urinate. This information is not intended to replace advice given to you by your health  care provider. Make sure you discuss any questions you have with your health care provider. Document Released: 04/17/2008 Document Revised: 12/15/2015 Document Reviewed: 09/15/2014 Elsevier Interactive Patient Education  2018 [employer]  

## 2017-12-20 ENCOUNTER — Ambulatory Visit (INDEPENDENT_AMBULATORY_CARE_PROVIDER_SITE_OTHER): Payer: BLUE CROSS/BLUE SHIELD | Admitting: Certified Nurse Midwife

## 2017-12-20 VITALS — BP 103/60 | HR 70 | Wt 143.9 lb

## 2017-12-20 DIAGNOSIS — Z3492 Encounter for supervision of normal pregnancy, unspecified, second trimester: Secondary | ICD-10-CM

## 2017-12-20 LAB — POCT URINALYSIS DIPSTICK
Bilirubin, UA: NEGATIVE
Blood, UA: NEGATIVE
GLUCOSE UA: NEGATIVE
Ketones, UA: NEGATIVE
LEUKOCYTES UA: NEGATIVE
NITRITE UA: NEGATIVE
Protein, UA: NEGATIVE
SPEC GRAV UA: 1.01 (ref 1.010–1.025)
Urobilinogen, UA: 0.2 E.U./dL
pH, UA: 7.5 (ref 5.0–8.0)

## 2017-12-20 NOTE — Patient Instructions (Signed)
WHAT OB PATIENTS CAN EXPECT   Confirmation of pregnancy and ultrasound ordered if medically indicated-[redacted] weeks gestation  New OB (NOB) intake with nurse and New OB (NOB) labs- [redacted] weeks gestation  New OB (NOB) physical examination with provider- 11/[redacted] weeks gestation  Flu vaccine-[redacted] weeks gestation  Anatomy scan-[redacted] weeks gestation  Glucose tolerance test, blood work to test for anemia, T-dap vaccine-[redacted] weeks gestation  Vaginal swabs/cultures-STD/Group B strep-[redacted] weeks gestation  Appointments every 4 weeks until 28 weeks  Every 2 weeks from 28 weeks until 36 weeks  Weekly visits from 36 weeks until delivery  Common Medications Safe in Pregnancy  Acne:      Constipation:  Benzoyl Peroxide     Colace  Clindamycin      Dulcolax Suppository  Topica Erythromycin     Fibercon  Salicylic Acid      Metamucil         Miralax AVOID:        Senakot   Accutane    Cough:  Retin-A       Cough Drops  Tetracycline      Phenergan w/ Codeine if Rx  Minocycline      Robitussin (Plain & DM)  Antibiotics:     Crabs/Lice:  Ceclor       RID  Cephalosporins    AVOID:  E-Mycins      Kwell  Keflex  Macrobid/Macrodantin   Diarrhea:  Penicillin      Kao-Pectate  Zithromax      Imodium AD         PUSH FLUIDS AVOID:       Cipro     Fever:  Tetracycline      Tylenol (Regular or Extra  Minocycline       Strength)  Levaquin      Extra Strength-Do not          Exceed 8 tabs/24 hrs Caffeine:        <253m/day (equiv. To 1 cup of coffee or  approx. 3 12 oz sodas)         Gas: Cold/Hayfever:       Gas-X  Benadryl      Mylicon  Claritin       Phazyme  **Claritin-D        Chlor-Trimeton    Headaches:  Dimetapp      ASA-Free Excedrin  Drixoral-Non-Drowsy     Cold Compress  Mucinex (Guaifenasin)     Tylenol (Regular or Extra  Sudafed/Sudafed-12 Hour     Strength)  **Sudafed PE Pseudoephedrine   Tylenol Cold & Sinus     Vicks Vapor Rub  Zyrtec  **AVOID if Problems With Blood  Pressure         Heartburn: Avoid lying down for at least 1 hour after meals  Aciphex      Maalox     Rash:  Milk of Magnesia     Benadryl    Mylanta       1% Hydrocortisone Cream  Pepcid  Pepcid Complete   Sleep Aids:  Prevacid      Ambien   Prilosec       Benadryl  Rolaids       Chamomile Tea  Tums (Limit 4/day)     Unisom  Zantac       Tylenol PM         Warm milk-add vanilla or  Hemorrhoids:       Sugar for taste  Anusol/Anusol H.C.  (RX: Analapram 2.5%)  Sugar Substitutes:  Hydrocortisone OTC     Ok in moderation  Preparation H      Tucks        Vaseline lotion applied to tissue with wiping    Herpes:     Throat:  Acyclovir      Oragel  Famvir  Valtrex     Vaccines:         Flu Shot Leg Cramps:       *Gardasil  Benadryl      Hepatitis A         Hepatitis B Nasal Spray:       Pneumovax  Saline Nasal Spray     Polio Booster         Tetanus Nausea:       Tuberculosis test or PPD  Vitamin B6 25 mg TID   AVOID:    Dramamine      *Gardasil  Emetrol       Live Poliovirus  Ginger Root 250 mg QID    MMR (measles, mumps &  High Complex Carbs @ Bedtime    rebella)  Sea Bands-Accupressure    Varicella (Chickenpox)  Unisom 1/2 tab TID     *No known complications           If received before Pain:         Known pregnancy;   Darvocet       Resume series after  Lortab        Delivery  Percocet    Yeast:   Tramadol      Femstat  Tylenol 3      Gyne-lotrimin  Ultram       Monistat  Vicodin           MISC:         All Sunscreens           Hair Coloring/highlights          Insect Repellant's          (Including DEET)         Mystic Tans Third Trimester of Pregnancy The third trimester is from week 29 through week 42, months 7 through 9. This trimester is when your unborn baby (fetus) is growing very fast. At the end of the ninth month, the unborn baby is about 20 inches in length. It weighs about 6-10 pounds. Follow these instructions at home:  Avoid all smoking,  herbs, and alcohol. Avoid drugs not approved by your doctor.  Do not use any tobacco products, including cigarettes, chewing tobacco, and electronic cigarettes. If you need help quitting, ask your doctor. You may get counseling or other support to help you quit.  Only take medicine as told by your doctor. Some medicines are safe and some are not during pregnancy.  Exercise only as told by your doctor. Stop exercising if you start having cramps.  Eat regular, healthy meals.  Wear a good support bra if your breasts are tender.  Do not use hot tubs, steam rooms, or saunas.  Wear your seat belt when driving.  Avoid raw meat, uncooked cheese, and liter boxes and soil used by cats.  Take your prenatal vitamins.  Take 1500-2000 milligrams of calcium daily starting at the 20th week of pregnancy until you deliver your baby.  Try taking medicine that helps you poop (stool softener) as needed, and if your doctor approves. Eat more fiber by eating fresh fruit, vegetables, and whole grains. Drink enough fluids to keep your pee (  urine) clear or pale yellow.  Take warm water baths (sitz baths) to soothe pain or discomfort caused by hemorrhoids. Use hemorrhoid cream if your doctor approves.  If you have puffy, bulging veins (varicose veins), wear support hose. Raise (elevate) your feet for 15 minutes, 3-4 times a day. Limit salt in your diet.  Avoid heavy lifting, wear low heels, and sit up straight.  Rest with your legs raised if you have leg cramps or low back pain.  Visit your dentist if you have not gone during your pregnancy. Use a soft toothbrush to brush your teeth. Be gentle when you floss.  You can have sex (intercourse) unless your doctor tells you not to.  Do not travel far distances unless you must. Only do so with your doctor's approval.  Take prenatal classes.  Practice driving to the hospital.  Pack your hospital bag.  Prepare the baby's room.  Go to your doctor  visits. Get help if:  You are not sure if you are in labor or if your water has broken.  You are dizzy.  You have mild cramps or pressure in your lower belly (abdominal).  You have a nagging pain in your belly area.  You continue to feel sick to your stomach (nauseous), throw up (vomit), or have watery poop (diarrhea).  You have bad smelling fluid coming from your vagina.  You have pain with peeing (urination). Get help right away if:  You have a fever.  You are leaking fluid from your vagina.  You are spotting or bleeding from your vagina.  You have severe belly cramping or pain.  You lose or gain weight rapidly.  You have trouble catching your breath and have chest pain.  You notice sudden or extreme puffiness (swelling) of your face, hands, ankles, feet, or legs.  You have not felt the baby move in over an hour.  You have severe headaches that do not go away with medicine.  You have vision changes. This information is not intended to replace advice given to you by your health care provider. Make sure you discuss any questions you have with your health care provider. Document Released: 10/03/2009 Document Revised: 12/15/2015 Document Reviewed: 09/09/2012 Elsevier Interactive Patient Education  2017 [employer].

## 2017-12-20 NOTE — Progress Notes (Signed)
ROB- pt is doing well 

## 2017-12-22 NOTE — Progress Notes (Signed)
ROB-Doing well, started new job. Work note given for pregnancy precautions. Anticipatory guidance regarding course of prenatal care. Reviewed red flag symptoms and when to call. RTC x 4 weeks for ROB or sooner if needed.

## 2017-12-25 ENCOUNTER — Encounter: Payer: Self-pay | Admitting: Certified Nurse Midwife

## 2017-12-30 ENCOUNTER — Encounter: Payer: Self-pay | Admitting: Certified Nurse Midwife

## 2018-01-01 ENCOUNTER — Encounter: Payer: BLUE CROSS/BLUE SHIELD | Admitting: Certified Nurse Midwife

## 2018-01-15 ENCOUNTER — Ambulatory Visit (INDEPENDENT_AMBULATORY_CARE_PROVIDER_SITE_OTHER): Payer: BLUE CROSS/BLUE SHIELD | Admitting: Certified Nurse Midwife

## 2018-01-15 VITALS — BP 89/55 | HR 72 | Wt 149.5 lb

## 2018-01-15 DIAGNOSIS — L299 Pruritus, unspecified: Secondary | ICD-10-CM

## 2018-01-15 DIAGNOSIS — Z3492 Encounter for supervision of normal pregnancy, unspecified, second trimester: Secondary | ICD-10-CM

## 2018-01-15 LAB — POCT URINALYSIS DIPSTICK
BILIRUBIN UA: NEGATIVE
Blood, UA: NEGATIVE
GLUCOSE UA: NEGATIVE
KETONES UA: NEGATIVE
Leukocytes, UA: NEGATIVE
Nitrite, UA: NEGATIVE
Protein, UA: NEGATIVE
Spec Grav, UA: >=1.03 — AB (ref 1.010–1.025)
Urobilinogen, UA: 0.2 E.U./dL
pH, UA: 6 (ref 5.0–8.0)

## 2018-01-15 MED ORDER — TETANUS-DIPHTH-ACELL PERTUSSIS 5-2.5-18.5 LF-MCG/0.5 IM SUSP
0.5000 mL | Freq: Once | INTRAMUSCULAR | Status: AC
  Administered 2018-01-15: 0.5 mL via INTRAMUSCULAR

## 2018-01-15 NOTE — Progress Notes (Signed)
ROB,doing well, has some itching on arm, legs, and abdomen,without a rash. She has tried Aveeno lotion with no relief. Patient encouraged to try Benadryl at night for itching.  Given symptoms of generalized itching without rash , bile acids and CMP drawn today. Glucola/TDAP/RPR/CBC/BTC signed today.  Discussed private cord blood collection and birth control options with pamphlet given. Anticipatory guidance given on future prenatal care. Red flag symptoms reviewed and when to call. Patient verbalizes understanding. RTC X2 weeks.  Shanika Creacy,SNM/Meleny Tregoning,CNM

## 2018-01-15 NOTE — Progress Notes (Signed)
Pt is here for an ROB visit. Pt is c/o generalized itching.

## 2018-01-15 NOTE — Patient Instructions (Signed)

## 2018-01-16 LAB — COMPREHENSIVE METABOLIC PANEL
ALBUMIN: 3.8 g/dL (ref 3.5–5.5)
ALT: 6 IU/L (ref 0–32)
AST: 13 IU/L (ref 0–40)
Albumin/Globulin Ratio: 1.4 (ref 1.2–2.2)
Alkaline Phosphatase: 114 IU/L (ref 39–117)
BILIRUBIN TOTAL: 0.2 mg/dL (ref 0.0–1.2)
BUN / CREAT RATIO: 14 (ref 9–23)
BUN: 6 mg/dL (ref 6–20)
CALCIUM: 8.9 mg/dL (ref 8.7–10.2)
CHLORIDE: 102 mmol/L (ref 96–106)
CO2: 21 mmol/L (ref 20–29)
CREATININE: 0.43 mg/dL — AB (ref 0.57–1.00)
GFR calc non Af Amer: 148 mL/min/{1.73_m2} (ref >59)
GFR, EST AFRICAN AMERICAN: 171 mL/min/{1.73_m2} (ref >59)
GLUCOSE: 75 mg/dL (ref 65–99)
Globulin, Total: 2.8 g/dL (ref 1.5–4.5)
Potassium: 3.7 mmol/L (ref 3.5–5.2)
Sodium: 135 mmol/L (ref 134–144)
TOTAL PROTEIN: 6.6 g/dL (ref 6.0–8.5)

## 2018-01-16 LAB — CBC
HEMATOCRIT: 33.1 % — AB (ref 34.0–46.6)
Hemoglobin: 10.6 g/dL — ABNORMAL LOW (ref 11.1–15.9)
MCH: 25.5 pg — ABNORMAL LOW (ref 26.6–33.0)
MCHC: 32 g/dL (ref 31.5–35.7)
MCV: 80 fL (ref 79–97)
Platelets: 294 10*3/uL (ref 150–450)
RBC: 4.16 x10E6/uL (ref 3.77–5.28)
RDW: 14 % (ref 12.3–15.4)
WBC: 8.3 10*3/uL (ref 3.4–10.8)

## 2018-01-16 LAB — GLUCOSE, 1 HOUR GESTATIONAL: Gestational Diabetes Screen: 73 mg/dL (ref 65–139)

## 2018-01-16 LAB — BILE ACIDS, TOTAL: BILE ACIDS TOTAL: 7.5 umol/L (ref 4.7–24.5)

## 2018-01-16 LAB — BILIRUBIN, DIRECT: Bilirubin, Direct: 0.07 mg/dL (ref 0.00–0.40)

## 2018-01-16 LAB — RPR: RPR: NONREACTIVE

## 2018-01-18 ENCOUNTER — Observation Stay
Admission: EM | Admit: 2018-01-18 | Discharge: 2018-01-18 | Disposition: A | Discharge status: Home / Self Care | Payer: BLUE CROSS/BLUE SHIELD | Attending: Certified Nurse Midwife | Admitting: Certified Nurse Midwife

## 2018-01-18 ENCOUNTER — Other Ambulatory Visit: Payer: Self-pay

## 2018-01-18 DIAGNOSIS — Z3A28 28 weeks gestation of pregnancy: Secondary | ICD-10-CM | POA: Insufficient documentation

## 2018-01-18 DIAGNOSIS — O9989 Other specified diseases and conditions complicating pregnancy, childbirth and the puerperium: Secondary | ICD-10-CM

## 2018-01-18 DIAGNOSIS — O26893 Other specified pregnancy related conditions, third trimester: Principal | ICD-10-CM | POA: Insufficient documentation

## 2018-01-18 DIAGNOSIS — R109 Unspecified abdominal pain: Secondary | ICD-10-CM

## 2018-01-18 LAB — WET PREP, GENITAL
CLUE CELLS WET PREP: NONE SEEN
Sperm: NONE SEEN
TRICH WET PREP: NONE SEEN
Yeast Wet Prep HPF POC: NONE SEEN

## 2018-01-18 LAB — FETAL FIBRONECTIN: Fetal Fibronectin: NEGATIVE

## 2018-01-18 LAB — URINALYSIS, COMPLETE (UACMP) WITH MICROSCOPIC
Bilirubin Urine: NEGATIVE
GLUCOSE, UA: NEGATIVE mg/dL
HGB URINE DIPSTICK: NEGATIVE
Ketones, ur: NEGATIVE mg/dL
Leukocytes, UA: NEGATIVE
NITRITE: NEGATIVE
PROTEIN: NEGATIVE mg/dL
Specific Gravity, Urine: 1.013 (ref 1.005–1.030)
pH: 8 (ref 5.0–8.0)

## 2018-01-18 MED ORDER — CYCLOBENZAPRINE HCL 10 MG PO TABS
10.0000 mg | ORAL_TABLET | Freq: Three times a day (TID) | ORAL | Status: DC | PRN
  Administered 2018-01-18: 10 mg via ORAL
  Filled 2018-01-18: qty 1

## 2018-01-18 MED ORDER — ACETAMINOPHEN 500 MG PO TABS
1000.0000 mg | ORAL_TABLET | Freq: Four times a day (QID) | ORAL | Status: DC | PRN
  Administered 2018-01-18: 1000 mg via ORAL
  Filled 2018-01-18: qty 2

## 2018-01-18 MED ORDER — ACETAMINOPHEN 500 MG PO TABS: 1000.0000 mg | ORAL_TABLET | Freq: Four times a day (QID) | ORAL | 0 refills | Status: DC | PRN

## 2018-01-18 NOTE — Discharge Summary (Signed)
Obstetric Discharge Summary  Patient ID: Gina Lester MRN: [id number] DOB/AGE: 20/03/1996 22 y.o.   Date of Admission: 01/18/2018 Serafina RoyalsMichelle Lawhorn, CNM [facility](D. Evans, MD)  Date of Discharge: 01/18/2018 Serafina RoyalsMichelle Lawhorn, CNM [facility](D. Evans, MD)  Admitting Diagnosis: Observation at 8263w2d  Secondary Diagnosis: Varicella non-immune     Discharge Diagnosis: No other diagnosis   Antepartum Procedures: NST, FFN, Wet prep    Brief Hospital Course   L&D OB Triage Note  Gina Lester is a 22 y.o. G2P1000 female at 3563w2d, EDD Estimated Date of Delivery: 04/10/18 who presented to triage for complaints of abdominal cramping and lower back pain.  She was evaluated by myself with no significant findings for preterm labor or fetal distress . Vital signs stable. An NST was performed and has been reviewed by CNM. She was treated with tylenol, flexeril, and warm heat.   NST INTERPRETATION: Indications: rule out uterine contractions  Mode: External Baseline Rate (A): 140 bpm(143 fht, monitors d/c'd for discharge) Variability: Moderate Accelerations: 15 x 15 Decelerations: None Contraction Frequency (min): irritability  Impression: reactive  Urinalysis    Component Value Date/Time   COLORURINE YELLOW (A) 01/18/2018 1936   APPEARANCEUR HAZY (A) 01/18/2018 1936   LABSPEC 1.013 01/18/2018 1936   PHURINE 8.0 01/18/2018 1936   GLUCOSEU NEGATIVE 01/18/2018 1936   HGBUR NEGATIVE 01/18/2018 1936   BILIRUBINUR NEGATIVE 01/18/2018 1936   KETONESUR NEGATIVE 01/18/2018 1936   PROTEINUR NEGATIVE 01/18/2018 1936   NITRITE NEGATIVE 01/18/2018 1936   LEUKOCYTESUR NEGATIVE 01/18/2018 1936   Microscopic wet-mount exam shows white blood cells.  Fetal fibronectin    Ref Range & Units 19:36  Fetal Fibronectin NEGATIVE NEGATIVE   Appearance, FETFIB CLEAR CLEAR         Plan: NST performed was reviewed and was found to be reactive. She was discharged home with bleeding/labor precautions.   Continue routine prenatal care. Follow up with OB/GYN as previously scheduled.    Discharge Instructions: Per After Visit Summary.  Activity: Refer to After Visit Summary  Diet: Regular  Medications: Allergies as of 01/18/2018   No Known Allergies     Medication List    STOP taking these medications   loratadine 10 MG tablet Commonly known as:  CLARITIN     TAKE these medications   acetaminophen 500 MG tablet Commonly known as:  TYLENOL Take 2 tablets (1,000 mg total) by mouth every 6 (six) hours as needed for fever or headache.   prenatal multivitamin Tabs tablet Take 1 tablet by mouth daily at 12 noon.      Outpatient follow up:  Follow-up Information    [facility] Go to.   Why:  Keep regularly scheduled appointments. Contact information: [address]  [address] [place] [id number] [phone]          Discharged Condition: stable  Discharged to: home   Gunnar BullaJenkins Michelle Lawhorn, CNM [facility], [facility]

## 2018-01-18 NOTE — OB Triage Note (Signed)
Pt arrival to triage with c/o constant sharp abdominal and back pain starting earlier today, but worsening.  Rates pain 10/10.  Pt denies vaginal bleeding and leaking of fluid and states she is feeling baby move normally.  EFM and toco applied and assessing.

## 2018-01-18 NOTE — OB Triage Note (Signed)
Discharge instructions provided and reviewed.  Pain management and follow up care discussed.  Pt verbalized understanding.

## 2018-01-20 ENCOUNTER — Encounter (INDEPENDENT_AMBULATORY_CARE_PROVIDER_SITE_OTHER): Payer: Self-pay

## 2018-01-24 ENCOUNTER — Encounter: Payer: Self-pay | Admitting: Certified Nurse Midwife

## 2018-01-30 ENCOUNTER — Encounter: Payer: BLUE CROSS/BLUE SHIELD | Admitting: Certified Nurse Midwife

## 2018-01-30 ENCOUNTER — Encounter: Payer: Self-pay | Admitting: Certified Nurse Midwife

## 2018-02-03 ENCOUNTER — Ambulatory Visit (INDEPENDENT_AMBULATORY_CARE_PROVIDER_SITE_OTHER): Payer: BLUE CROSS/BLUE SHIELD | Admitting: Certified Nurse Midwife

## 2018-02-03 VITALS — BP 88/52 | HR 62 | Wt 151.1 lb

## 2018-02-03 DIAGNOSIS — Z3492 Encounter for supervision of normal pregnancy, unspecified, second trimester: Secondary | ICD-10-CM

## 2018-02-03 LAB — POCT URINALYSIS DIPSTICK
Bilirubin, UA: NEGATIVE
GLUCOSE UA: NEGATIVE
Ketones, UA: NEGATIVE
LEUKOCYTES UA: NEGATIVE
NITRITE UA: NEGATIVE
Protein, UA: POSITIVE — AB
RBC UA: NEGATIVE
Spec Grav, UA: 1.02 (ref 1.010–1.025)
Urobilinogen, UA: 0.2 E.U./dL
pH, UA: 7 (ref 5.0–8.0)

## 2018-02-03 MED ORDER — CYCLOBENZAPRINE HCL 10 MG PO TABS: 10.0000 mg | ORAL_TABLET | Freq: Three times a day (TID) | ORAL | 2 refills | Status: DC | PRN

## 2018-02-03 NOTE — Progress Notes (Signed)
Pt is here for an ROB visit. Thinks she might have lost her mucous plug.

## 2018-02-03 NOTE — Patient Instructions (Addendum)
Round Ligament Pain The round ligament is a cord of muscle and tissue that helps to support the uterus. It can become a source of pain during pregnancy if it becomes stretched or twisted as the baby grows. The pain usually begins in the second trimester of pregnancy, and it can come and go until the baby is delivered. It is not a serious problem, and it does not cause harm to the baby. Round ligament pain is usually a short, sharp, and pinching pain, but it can also be a dull, lingering, and aching pain. The pain is felt in the lower side of the abdomen or in the groin. It usually starts deep in the groin and moves up to the outside of the hip area. Pain can occur with:  A sudden change in position.  Rolling over in bed.  Coughing or sneezing.  Physical activity.  Follow these instructions at home: Watch your condition for any changes. Take these steps to help with your pain:  When the pain starts, relax. Then try: ? Sitting down. ? Flexing your knees up to your abdomen. ? Lying on your side with one pillow under your abdomen and another pillow between your legs. ? Sitting in a warm bath for 15-20 minutes or until the pain goes away.  Take over-the-counter and prescription medicines only as told by your health care provider.  Move slowly when you sit and stand.  Avoid long walks if they cause pain.  Stop or lessen your physical activities if they cause pain.  Contact a health care provider if:  Your pain does not go away with treatment.  You feel pain in your back that you did not have before.  Your medicine is not helping. Get help right away if:  You develop a fever or chills.  You develop uterine contractions.  You develop vaginal bleeding.  You develop nausea or vomiting.  You develop diarrhea.  You have pain when you urinate. This information is not intended to replace advice given to you by your health care provider. Make sure you discuss any questions you have  with your health care provider. Document Released: 04/17/2008 Document Revised: 12/15/2015 Document Reviewed: 09/15/2014 Elsevier Interactive Patient Education  2018 [employer]. Back Pain in Pregnancy Back pain during pregnancy is common. Back pain may be caused by several factors that are related to changes during your pregnancy. Follow these instructions at home: Managing pain, stiffness, and swelling  If directed, apply ice for sudden (acute) back pain. ? Put ice in a plastic bag. ? Place a towel between your skin and the bag. ? Leave the ice on for 20 minutes, 2-3 times per day.  If directed, apply heat to the affected area before you exercise: ? Place a towel between your skin and the heat pack or heating pad. ? Leave the heat on for 20-30 minutes. ? Remove the heat if your skin turns bright red. This is especially important if you are unable to feel pain, heat, or cold. You may have a greater risk of getting burned. Activity  Exercise as told by your health care provider. Exercising is the best way to prevent or manage back pain.  Listen to your body when lifting. If lifting hurts, ask for help or bend your knees. This uses your leg muscles instead of your back muscles.  Squat down when picking up something from the floor. Do not bend over.  Only use bed rest as told by your health care provider. Bed  rest should only be used for the most severe episodes of back pain. Standing, Sitting, and Lying Down  Do not stand in one place for long periods of time.  Use good posture when sitting. Make sure your head rests over your shoulders and is not hanging forward. Use a pillow on your lower back if necessary.  Try sleeping on your side, preferably the left side, with a pillow or two between your legs. If you are sore after a night's rest, your bed may be too soft. A firm mattress may provide more support for your back during pregnancy. General instructions  Do not wear high  heels.  Eat a healthy diet. Try to gain weight within your health care provider's recommendations.  Use a maternity girdle, elastic sling, or back brace as told by your health care provider.  Take over-the-counter and prescription medicines only as told by your health care provider.  Keep all follow-up visits as told by your health care provider. This is important. This includes any visits with any specialists, such as a physical therapist. Contact a health care provider if:  Your back pain interferes with your daily activities.  You have increasing pain in other parts of your body. Get help right away if:  You develop numbness, tingling, weakness, or problems with the use of your arms or legs.  You develop severe back pain that is not controlled with medicine.  You have a sudden change in bowel or bladder control.  You develop shortness of breath, dizziness, or you faint.  You develop nausea, vomiting, or sweating.  You have back pain that is a rhythmic, cramping pain similar to labor pains. Labor pain is usually 1-2 minutes apart, lasts for about 1 minute, and involves a bearing down feeling or pressure in your pelvis.  You have back pain and your water breaks or you have vaginal bleeding.  You have back pain or numbness that travels down your leg.  Your back pain developed after you fell.  You develop pain on one side of your back.  You see blood in your urine.  You develop skin blisters in the area of your back pain. This information is not intended to replace advice given to you by your health care provider. Make sure you discuss any questions you have with your health care provider. Document Released: 10/17/2005 Document Revised: 12/15/2015 Document Reviewed: 03/23/2015 Elsevier Interactive Patient Education  2018 [employer]. Cyclobenzaprine tablets What is this medicine? CYCLOBENZAPRINE (sye kloe BEN za preen) is a muscle relaxer. It is used to treat muscle pain,  spasms, and stiffness. This medicine may be used for other purposes; ask your health care provider or pharmacist if you have questions. COMMON BRAND NAME(S): Fexmid, Flexeril What should I tell my health care provider before I take this medicine? They need to know if you have any of these conditions: -heart disease, irregular heartbeat, or previous heart attack -liver disease -thyroid problem -an unusual or allergic reaction to cyclobenzaprine, tricyclic antidepressants, lactose, other medicines, foods, dyes, or preservatives -pregnant or trying to get pregnant -breast-feeding How should I use this medicine? Take this medicine by mouth with a glass of water. Follow the directions on the prescription label. If this medicine upsets your stomach, take it with food or milk. Take your medicine at regular intervals. Do not take it more often than directed. Talk to your pediatrician regarding the use of this medicine in children. Special care may be needed. Overdosage: If you think you have  taken too much of this medicine contact a poison control center or emergency room at once. NOTE: This medicine is only for you. Do not share this medicine with others. What if I miss a dose? If you miss a dose, take it as soon as you can. If it is almost time for your next dose, take only that dose. Do not take double or extra doses. What may interact with this medicine? Do not take this medicine with any of the following medications: -certain medicines for fungal infections like fluconazole, itraconazole, ketoconazole, posaconazole, voriconazole -cisapride -dofetilide -dronedarone -halofantrine -levomethadyl -MAOIs like Carbex, Eldepryl, Marplan, Nardil, and Parnate -narcotic medicines for cough -pimozide -thioridazine -ziprasidone This medicine may also interact with the following medications: -alcohol -antihistamines for allergy, cough and cold -certain medicines for anxiety or sleep -certain  medicines for cancer -certain medicines for depression like amitriptyline, fluoxetine, sertraline -certain medicines for infection like alfuzosin, chloroquine, clarithromycin, levofloxacin, mefloquine, pentamidine, troleandomycin -certain medicines for irregular heart beat -certain medicines for seizures like phenobarbital, primidone -contrast dyes -general anesthetics like halothane, isoflurane, methoxyflurane, propofol -local anesthetics like lidocaine, pramoxine, tetracaine -medicines that relax muscles for surgery -narcotic medicines for pain -other medicines that prolong the QT interval (cause an abnormal heart rhythm) -phenothiazines like chlorpromazine, mesoridazine, prochlorperazine This list may not describe all possible interactions. Give your health care provider a list of all the medicines, herbs, non-prescription drugs, or dietary supplements you use. Also tell them if you smoke, drink alcohol, or use illegal drugs. Some items may interact with your medicine. What should I watch for while using this medicine? Tell your doctor or health care professional if your symptoms do not start to get better or if they get worse. You may get drowsy or dizzy. Do not drive, use machinery, or do anything that needs mental alertness until you know how this medicine affects you. Do not stand or sit up quickly, especially if you are an older patient. This reduces the risk of dizzy or fainting spells. Alcohol may interfere with the effect of this medicine. Avoid alcoholic drinks. If you are taking another medicine that also causes drowsiness, you may have more side effects. Give your health care provider a list of all medicines you use. Your doctor will tell you how much medicine to take. Do not take more medicine than directed. Call emergency for help if you have problems breathing or unusual sleepiness. Your mouth may get dry. Chewing sugarless gum or sucking hard candy, and drinking plenty of water may  help. Contact your doctor if the problem does not go away or is severe. What side effects may I notice from receiving this medicine? Side effects that you should report to your doctor or health care professional as soon as possible: -allergic reactions like skin rash, itching or hives, swelling of the face, lips, or tongue -breathing problems -chest pain -fast, irregular heartbeat -hallucinations -seizures -unusually weak or tired Side effects that usually do not require medical attention (report to your doctor or health care professional if they continue or are bothersome): -headache -nausea, vomiting This list may not describe all possible side effects. Call your doctor for medical advice about side effects. You may report side effects to FDA at 1-800-FDA-1088. Where should I keep my medicine? Keep out of the reach of children. Store at room temperature between 15 and 30 degrees C (59 and 86 degrees F). Keep container tightly closed. Throw away any unused medicine after the expiration date. NOTE: This sheet is a summary.  It may not cover all possible information. If you have questions about this medicine, talk to your doctor, pharmacist, or health care provider.  2018 Elsevier/Gold Standard (2015-04-19 12:05:46)

## 2018-02-03 NOTE — Progress Notes (Signed)
ROB-Reports intermittent back pain despite home treatment measures. Rx: Flexeril, see orders. Reviewed red flag symptoms and when to call. RTC x 2 week for ROB or sooner if needed.

## 2018-02-17 ENCOUNTER — Encounter: Payer: BLUE CROSS/BLUE SHIELD | Admitting: Certified Nurse Midwife

## 2018-02-17 ENCOUNTER — Telehealth: Payer: Self-pay

## 2018-02-27 ENCOUNTER — Observation Stay
Admission: EM | Admit: 2018-02-27 | Discharge: 2018-02-28 | Disposition: A | Discharge status: Home / Self Care | Payer: BLUE CROSS/BLUE SHIELD | Attending: Obstetrics and Gynecology | Admitting: Obstetrics and Gynecology

## 2018-02-27 ENCOUNTER — Other Ambulatory Visit: Payer: Self-pay

## 2018-02-27 DIAGNOSIS — Z3A34 34 weeks gestation of pregnancy: Secondary | ICD-10-CM | POA: Insufficient documentation

## 2018-02-27 DIAGNOSIS — O4703 False labor before 37 completed weeks of gestation, third trimester: Principal | ICD-10-CM | POA: Insufficient documentation

## 2018-02-27 NOTE — OB Triage Note (Signed)
Patient arrived in triage with c/o ctx's q 7 mins that started approx 1 hour ago. Reports good fetal movement. Denies leaking fluid or vaginal bleeding. EFM applied and assessing.

## 2018-02-28 DIAGNOSIS — O4703 False labor before 37 completed weeks of gestation, third trimester: Secondary | ICD-10-CM | POA: Diagnosis present

## 2018-02-28 DIAGNOSIS — Z3A34 34 weeks gestation of pregnancy: Secondary | ICD-10-CM

## 2018-02-28 MED ORDER — HYDROXYZINE HCL 50 MG PO TABS
50.0000 mg | ORAL_TABLET | Freq: Once | ORAL | Status: AC | PRN
  Administered 2018-02-28: 50 mg via ORAL
  Filled 2018-02-28: qty 1

## 2018-02-28 NOTE — Discharge Instructions (Signed)
Keep regularly scheduled appointment. Return to hospital for leaking of fluid, vaginal bleeding, decreased fetal movement, or increase in painful contractions. Drink plenty of water, at least 8-10 glasses each day. May use tylenol as directed on packaging as needed for pain.

## 2018-02-28 NOTE — OB Triage Note (Signed)
Patient given discharge instructions as per AVS. Verbalized understanding and agreed to plan. Discharged, ambulatory in stable condition.

## 2018-03-02 NOTE — Discharge Summary (Signed)
Obstetric Discharge Summary  Patient ID: Gina Lester MRN: [id number] DOB/AGE: 20-15-1997 20 y.o.   Date of Admission: 02/27/2018  Date of Discharge: 02/28/2018  Admitting Diagnosis: Observation at [redacted]w[redacted]d  Secondary Diagnosis: Varicella non-immune    Discharge Diagnosis: No other diagnosis   Antepartum Procedures: NST    Brief Hospital Course   L&D OB Triage Note  Gina Lester is a 22 y.o. G2P1000 female at [redacted]w[redacted]d, EDD Estimated Date of Delivery: 04/10/18 who presented to triage for complaints of uterine contractions.  She was evaluated by the nurses with no significant findings for preterm labor or fetal distress. Vital signs stable. An NST was performed and has been reviewed by CNM. She was treated with PO vistaril.   NST INTERPRETATION: Indications: rule out uterine contractions  Mode: External(EFM d/c'd for patient discharge home) Baseline Rate (A): 135 bpm Variability: Moderate Accelerations: 15 x 15 Decelerations: None Contraction Frequency (min): irritability, maternal movement  Impression: reactive  Dilation: 1(external os 2-3) Effacement (%): 40 Station: (high) Presentation: Undeterminable Exam by:: [facility] RN  Plan: NST performed was reviewed and was found to be reactive. She was discharged home with bleeding/labor precautions.  Continue routine prenatal care. Follow up with CNM as previously scheduled.   Discharge Instructions: Per After Visit Summary.  Activity: Also refer to After Visit Summary.  Diet: Regular  Medications: Allergies as of 02/28/2018   No Known Allergies     Medication List    ASK your doctor about these medications   acetaminophen 500 MG tablet Commonly known as:  TYLENOL Take 2 tablets (1,000 mg total) by mouth every 6 (six) hours as needed for fever or headache.   cyclobenzaprine 10 MG tablet Commonly known as:  FLEXERIL Take 1 tablet (10 mg total) by mouth 3 (three) times daily as needed for muscle spasms.    prenatal multivitamin Tabs tablet Take 1 tablet by mouth daily at 12 noon.      Discharged Condition: stable  Discharged to: home   Gunnar Bulla, CNM [facility], [facility]

## 2018-03-03 ENCOUNTER — Ambulatory Visit (INDEPENDENT_AMBULATORY_CARE_PROVIDER_SITE_OTHER): Payer: BLUE CROSS/BLUE SHIELD | Admitting: Certified Nurse Midwife

## 2018-03-03 ENCOUNTER — Encounter: Payer: Self-pay | Admitting: Certified Nurse Midwife

## 2018-03-03 VITALS — BP 95/59 | HR 75 | Wt 159.1 lb

## 2018-03-03 DIAGNOSIS — L299 Pruritus, unspecified: Secondary | ICD-10-CM

## 2018-03-03 DIAGNOSIS — Z3403 Encounter for supervision of normal first pregnancy, third trimester: Secondary | ICD-10-CM

## 2018-03-03 LAB — POCT URINALYSIS DIPSTICK
Bilirubin, UA: NEGATIVE
Blood, UA: NEGATIVE
Glucose, UA: NEGATIVE
KETONES UA: NEGATIVE
Leukocytes, UA: NEGATIVE
NITRITE UA: NEGATIVE
PH UA: 5 (ref 5.0–8.0)
PROTEIN UA: NEGATIVE
Spec Grav, UA: 1.02 (ref 1.010–1.025)
UROBILINOGEN UA: 0.2 U/dL

## 2018-03-03 MED ORDER — HYDROXYZINE HCL 25 MG PO TABS: 25.0000 mg | ORAL_TABLET | Freq: Three times a day (TID) | ORAL | 0 refills | Status: DC | PRN

## 2018-03-03 NOTE — Progress Notes (Signed)
ROB , doing well. Complains that she is still having itching and that bendryl and baths are not helping. Repeat labs today. Order placed for vistaril . Follow up 1 wk.   Doreene BurkeAnnie Danish Ruffins, CNM

## 2018-03-03 NOTE — Progress Notes (Signed)
Pt is here for an ROB visit. 

## 2018-03-03 NOTE — Addendum Note (Signed)
Addended by: Brooke DareSICK, Treylon Henard L on: 03/03/2018 03:33 PM   Modules accepted: Orders

## 2018-03-03 NOTE — Patient Instructions (Addendum)
Group B Streptococcus Infection During Pregnancy Group B Streptococcus (GBS) is a type of bacteria (Streptococcus agalactiae) that is often found in healthy people, commonly in the rectum, vagina, and intestines. In people who are healthy and not pregnant, the bacteria rarely cause serious illness or complications. However, women who test positive for GBS during pregnancy can pass the bacteria to their baby during childbirth, which can cause serious infection in the baby after birth. Women with GBS may also have infections during their pregnancy or immediately after childbirth, such as such as urinary tract infections (UTIs) or infections of the uterus (uterine infections). Having GBS also increases a woman's risk of complications during pregnancy, such as early (preterm) labor or delivery, miscarriage, or stillbirth. Routine testing (screening) for GBS is recommended for all pregnant women. What increases the risk? You may have a higher risk for GBS infection during pregnancy if you had one during a past pregnancy. What are the signs or symptoms? In most cases, GBS infection does not cause symptoms in pregnant women. Signs and symptoms of a possible GBS-related infection may include:  Labor starting before the 37th week of pregnancy.  A UTI or bladder infection, which may cause: ? Fever. ? Pain or burning during urination. ? Frequent urination.  Fever during labor, along with: ? Bad-smelling discharge. ? Uterine tenderness. ? Rapid heartbeat in the mother, baby, or both.  Rare but serious symptoms of a possible GBS-related infection in women include:  Blood infection (septicemia). This may cause fever, chills, or confusion.  Lung infection (pneumonia). This may cause fever, chills, cough, rapid breathing, difficulty breathing, or chest pain.  Bone, joint, skin, or soft tissue infection.  How is this diagnosed? You may be screened for GBS between week 35 and week 37 of your pregnancy. If  you have symptoms of preterm labor, you may be screened earlier. This condition is diagnosed based on lab test results from:  A swab of fluid from the vagina and rectum.  A urine sample.  How is this treated? This condition is treated with antibiotic medicine. When you go into labor, or as soon as your water breaks (your membranes rupture), you will be given antibiotics through an IV tube. Antibiotics will continue until after you give birth. If you are having a cesarean delivery, you do not need antibiotics unless your membranes have already ruptured. Follow these instructions at home:  Take over-the-counter and prescription medicines only as told by your health care provider.  Take your antibiotic medicine as told by your health care provider. Do not stop taking the antibiotic even if you start to feel better.  Keep all pre-birth (prenatal) visits and follow-up visits as told by your health care provider. This is important. Contact a health care provider if:  You have pain or burning when you urinate.  You have to urinate frequently.  You have a fever or chills.  You develop a bad-smelling vaginal discharge. Get help right away if:  Your membranes rupture.  You go into labor.  You have severe pain in your abdomen.  You have difficulty breathing.  You have chest pain. This information is not intended to replace advice given to you by your health care provider. Make sure you discuss any questions you have with your health care provider. Document Released: 10/16/2007 Document Revised: 02/03/2016 Document Reviewed: 02/02/2016 Elsevier Interactive Patient Education  2018 [employer]. Skin Conditions During Pregnancy Pregnancy affects many parts of your body. One part is your skin. Most skin problems  that develop during pregnancy are not serious and are considered a normal part of pregnancy. They go away on their own after the baby is born. Other skin problems may need  treatment. What type of skin problems can develop during pregnancy?  Stretch marks. Stretch marks are purple or pink lines on the skin. They may appear on the belly, breasts, thighs, or buttocks. Stretch marks are caused by weight gain that causes the skin to stretch. Stretch marks do not cause problems. Almost all women get them during pregnancy.  Darkening of the skin (hyperpigmentation). The darkening may occur in patches or as a line. Patches may appear on the face, nipples, or genital area. Lines often stretch from the belly button to the pubic area. Hyperpigmentation develops in almost all pregnant women. It is more severe in women with a dark complexion.  Spider angiomas. These are tiny pink or red lines that go out from a center point, like the legs of a spider. Usually, they are on the face, neck, and arms. They do not cause problems. They are most common in women with light complexions.  Palmar erythema. This is a reddening of the palms. It is most common in women with light complexions.  Swelling and redness. This can occur on the face, eyelids, fingers, or toes.  Pruritic urticarial papules and plaques of pregnancy (PUPPP). This is a rash that is itchy, red, and has tiny blisters. The cause is unknown. It usually starts on the abdomen and may affect the arms or legs. It does not affect the face. It usually begins later in pregnancy. About a third of all pregnant women develop this condition. There are no associated problems to the fetus with this rash. Sometimes, oral steroids are used to calm down the itch. The rash clears after the baby is born.  Prurigo of pregnancy. This is a disease in which red patches and bumps appear on the arms and legs. The cause is unknown. The patches and bumps clear after the baby is born. About a third of pregnant women develop this disease.  Acne. Pimples may develop, including in women who have had clear skin for a long time.  Skin tags. These are small  flaps of skin that stick out from the body. They may grow or become darker during pregnancy. They are usually harmless.  Moles. These are flat or slightly raised growths. They are usually round and pink or brown. They may grow or become darker during pregnancy.  Intrahepatic cholestasis of pregnancy. This is a rare condition that causes itchy skin. It may run in families. It increases the risk of complications for the fetus. This condition usually resolves after delivery. It can recur with subsequent pregnancies.  Impetigo herpetiformis. This is a form of a severe skin disease called pustular psoriasis. Usually, delivery is the only method of resolving the condition.  Pruritic folliculitis of pregnancy. This is a rare condition that causes pimple-like skin growths. It develops in the middle or later stages of pregnancy. Its cause is unknown.It usually resolves 2-3 weeks after delivery.  Pemphigoid gestationis. This is a very rare autoimmune disease. It causes a severely itchy rash and blisters. The rash does not appear on the face, scalp, or inside of the mouth. It usually resolves 3 months after delivery. It may recur with subsequent pregnancies. Some pre-existing skin conditions, such as atopic dermatitis, may become worse during pregnancy. Follow these instructions at home: Different conditions may have different instructions. In general:  Follow all  your health care provider's directions about medicines to treat skin problems while you are pregnant. Do not use any over-the-counter medicines (including medicated creams and lotions) until you have checked with your health care provider. Many medicines are not safe to use when you are pregnant.  Avoid time in the sun. This will help keep your skin from darkening. When you must be outside, use sunscreen and wear a hat with a wide brim to protect your face. The sunscreen should have a SPF of at least 15. This may help limit dark spots that develop  when the skin is exposed to the sun.  To avoid problems from stretched skin: ? Do not sit or stand for long periods of time. ? Exercise regularly. This helps keep your skin in good condition.  Use a gentle soap. This helps prevent acne.  Do not get too hot or too sweaty. This makes some skin rashes worse.  Wear loose clothes made of a soft fabric. This prevents skin irritation.  For itching, add oatmeal or cornstarch to your bathwater.  Use a skin moisturizer. Ask your health care provider for suggestions.  This information is not intended to replace advice given to you by your health care provider. Make sure you discuss any questions you have with your health care provider. Document Released: 08/11/2010 Document Revised: 12/15/2015 Document Reviewed: 04/20/2013 Elsevier Interactive Patient Education  [employer]2018 [employer]

## 2018-03-05 LAB — COMPREHENSIVE METABOLIC PANEL
ALBUMIN: 3.3 g/dL — AB (ref 3.5–5.5)
ALK PHOS: 231 IU/L — AB (ref 39–117)
ALT: 12 IU/L (ref 0–32)
AST: 19 IU/L (ref 0–40)
Albumin/Globulin Ratio: 1.1 — ABNORMAL LOW (ref 1.2–2.2)
BUN / CREAT RATIO: 18 (ref 9–23)
BUN: 7 mg/dL (ref 6–20)
Bilirubin Total: 0.3 mg/dL (ref 0.0–1.2)
CO2: 18 mmol/L — AB (ref 20–29)
CREATININE: 0.4 mg/dL — AB (ref 0.57–1.00)
Calcium: 8.8 mg/dL (ref 8.7–10.2)
Chloride: 106 mmol/L (ref 96–106)
GFR calc non Af Amer: 151 mL/min/{1.73_m2} (ref >59)
GFR, EST AFRICAN AMERICAN: 175 mL/min/{1.73_m2} (ref >59)
GLOBULIN, TOTAL: 2.9 g/dL (ref 1.5–4.5)
GLUCOSE: 96 mg/dL (ref 65–99)
Potassium: 3.8 mmol/L (ref 3.5–5.2)
SODIUM: 138 mmol/L (ref 134–144)
TOTAL PROTEIN: 6.2 g/dL (ref 6.0–8.5)

## 2018-03-05 LAB — BILIRUBIN, DIRECT: Bilirubin, Direct: 0.08 mg/dL (ref 0.00–0.40)

## 2018-03-05 LAB — BILE ACIDS, TOTAL: Bile Acids Total: 3.6 umol/L (ref 0.0–10.0)

## 2018-03-10 ENCOUNTER — Encounter: Payer: Self-pay | Admitting: Certified Nurse Midwife

## 2018-03-10 ENCOUNTER — Ambulatory Visit (INDEPENDENT_AMBULATORY_CARE_PROVIDER_SITE_OTHER): Payer: BLUE CROSS/BLUE SHIELD | Admitting: Certified Nurse Midwife

## 2018-03-10 VITALS — BP 96/67 | HR 91 | Wt 162.7 lb

## 2018-03-10 DIAGNOSIS — Z3483 Encounter for supervision of other normal pregnancy, third trimester: Secondary | ICD-10-CM

## 2018-03-10 DIAGNOSIS — L299 Pruritus, unspecified: Secondary | ICD-10-CM

## 2018-03-10 LAB — POCT URINALYSIS DIPSTICK OB
BILIRUBIN UA: NEGATIVE
Glucose, UA: NEGATIVE — AB
Ketones, UA: NEGATIVE
LEUKOCYTES UA: NEGATIVE
Nitrite, UA: NEGATIVE
POC,PROTEIN,UA: NEGATIVE
RBC UA: NEGATIVE
Spec Grav, UA: <=1.005 — AB (ref 1.010–1.025)
Urobilinogen, UA: 0.2 E.U./dL
pH, UA: 7.5 (ref 5.0–8.0)

## 2018-03-10 MED ORDER — LORATADINE 10 MG PO TBDP: 10.0000 mg | ORAL_TABLET | Freq: Every day | ORAL | 0 refills | Status: DC

## 2018-03-10 NOTE — Progress Notes (Signed)
ROB-Itching not relieved by Atarax. Rx: Claritin, see orders. Repeat cholestasis labs. 36 week cultures collected. Reviewed red flag symptoms and when to call. RTC x 1 week for ROB or sooner if needed.

## 2018-03-10 NOTE — Patient Instructions (Signed)
Skin Conditions During Pregnancy Pregnancy affects many parts of your body. One part is your skin. Most skin problems that develop during pregnancy are not serious and are considered a normal part of pregnancy. They go away on their own after the baby is born. Other skin problems may need treatment. What type of skin problems can develop during pregnancy?  Stretch marks. Stretch marks are purple or pink lines on the skin. They may appear on the belly, breasts, thighs, or buttocks. Stretch marks are caused by weight gain that causes the skin to stretch. Stretch marks do not cause problems. Almost all women get them during pregnancy.  Darkening of the skin (hyperpigmentation). The darkening may occur in patches or as a line. Patches may appear on the face, nipples, or genital area. Lines often stretch from the belly button to the pubic area. Hyperpigmentation develops in almost all pregnant women. It is more severe in women with a dark complexion.  Spider angiomas. These are tiny pink or red lines that go out from a center point, like the legs of a spider. Usually, they are on the face, neck, and arms. They do not cause problems. They are most common in women with light complexions.  Palmar erythema. This is a reddening of the palms. It is most common in women with light complexions.  Swelling and redness. This can occur on the face, eyelids, fingers, or toes.  Pruritic urticarial papules and plaques of pregnancy (PUPPP). This is a rash that is itchy, red, and has tiny blisters. The cause is unknown. It usually starts on the abdomen and may affect the arms or legs. It does not affect the face. It usually begins later in pregnancy. About a third of all pregnant women develop this condition. There are no associated problems to the fetus with this rash. Sometimes, oral steroids are used to calm down the itch. The rash clears after the baby is born.  Prurigo of pregnancy. This is a disease in which red  patches and bumps appear on the arms and legs. The cause is unknown. The patches and bumps clear after the baby is born. About a third of pregnant women develop this disease.  Acne. Pimples may develop, including in women who have had clear skin for a long time.  Skin tags. These are small flaps of skin that stick out from the body. They may grow or become darker during pregnancy. They are usually harmless.  Moles. These are flat or slightly raised growths. They are usually round and pink or brown. They may grow or become darker during pregnancy.  Intrahepatic cholestasis of pregnancy. This is a rare condition that causes itchy skin. It may run in families. It increases the risk of complications for the fetus. This condition usually resolves after delivery. It can recur with subsequent pregnancies.  Impetigo herpetiformis. This is a form of a severe skin disease called pustular psoriasis. Usually, delivery is the only method of resolving the condition.  Pruritic folliculitis of pregnancy. This is a rare condition that causes pimple-like skin growths. It develops in the middle or later stages of pregnancy. Its cause is unknown.It usually resolves 2-3 weeks after delivery.  Pemphigoid gestationis. This is a very rare autoimmune disease. It causes a severely itchy rash and blisters. The rash does not appear on the face, scalp, or inside of the mouth. It usually resolves 3 months after delivery. It may recur with subsequent pregnancies. Some pre-existing skin conditions, such as atopic dermatitis, may become worse   during pregnancy. Follow these instructions at home: Different conditions may have different instructions. In general:  Follow all your health care provider's directions about medicines to treat skin problems while you are pregnant. Do not use any over-the-counter medicines (including medicated creams and lotions) until you have checked with your health care provider. Many medicines are not  safe to use when you are pregnant.  Avoid time in the sun. This will help keep your skin from darkening. When you must be outside, use sunscreen and wear a hat with a wide brim to protect your face. The sunscreen should have a SPF of at least 15. This may help limit dark spots that develop when the skin is exposed to the sun.  To avoid problems from stretched skin: ? Do not sit or stand for long periods of time. ? Exercise regularly. This helps keep your skin in good condition.  Use a gentle soap. This helps prevent acne.  Do not get too hot or too sweaty. This makes some skin rashes worse.  Wear loose clothes made of a soft fabric. This prevents skin irritation.  For itching, add oatmeal or cornstarch to your bathwater.  Use a skin moisturizer. Ask your health care provider for suggestions.  This information is not intended to replace advice given to you by your health care provider. Make sure you discuss any questions you have with your health care provider. Document Released: 08/11/2010 Document Revised: 12/15/2015 Document Reviewed: 04/20/2013 Elsevier Interactive Patient Education  2018 Elsevier Inc.  

## 2018-03-10 NOTE — Progress Notes (Signed)
ROB-pt stated that she having pain and pressure in the vaginal area. Pt stated that she was given Atarax for entire body itching and pt thinks it is causing her to itch more.

## 2018-03-12 LAB — STREP GP B NAA: STREP GROUP B AG: NEGATIVE

## 2018-03-12 LAB — COMPREHENSIVE METABOLIC PANEL
A/G RATIO: 1.1 — AB (ref 1.2–2.2)
ALT: 8 IU/L (ref 0–32)
AST: 16 IU/L (ref 0–40)
Albumin: 3.3 g/dL — ABNORMAL LOW (ref 3.5–5.5)
Alkaline Phosphatase: 229 IU/L — ABNORMAL HIGH (ref 39–117)
BILIRUBIN TOTAL: 0.2 mg/dL (ref 0.0–1.2)
BUN / CREAT RATIO: 13 (ref 9–23)
BUN: 5 mg/dL — ABNORMAL LOW (ref 6–20)
CO2: 19 mmol/L — ABNORMAL LOW (ref 20–29)
Calcium: 8.8 mg/dL (ref 8.7–10.2)
Chloride: 103 mmol/L (ref 96–106)
Creatinine, Ser: 0.39 mg/dL — ABNORMAL LOW (ref 0.57–1.00)
GFR calc non Af Amer: 153 mL/min/{1.73_m2} (ref >59)
GFR, EST AFRICAN AMERICAN: 176 mL/min/{1.73_m2} (ref >59)
Globulin, Total: 3.1 g/dL (ref 1.5–4.5)
Glucose: 84 mg/dL (ref 65–99)
POTASSIUM: 4.1 mmol/L (ref 3.5–5.2)
SODIUM: 137 mmol/L (ref 134–144)
TOTAL PROTEIN: 6.4 g/dL (ref 6.0–8.5)

## 2018-03-12 LAB — BILIRUBIN, DIRECT: Bilirubin, Direct: 0.08 mg/dL (ref 0.00–0.40)

## 2018-03-12 LAB — BILE ACIDS, TOTAL: Bile Acids Total: 5.9 umol/L (ref 0.0–10.0)

## 2018-03-12 LAB — GC/CHLAMYDIA PROBE AMP
Chlamydia trachomatis, NAA: NEGATIVE
Neisseria gonorrhoeae by PCR: NEGATIVE

## 2018-03-17 ENCOUNTER — Encounter: Payer: BLUE CROSS/BLUE SHIELD | Admitting: Certified Nurse Midwife

## 2018-03-24 ENCOUNTER — Observation Stay
Admission: EM | Admit: 2018-03-24 | Discharge: 2018-03-24 | Disposition: A | Discharge status: Home / Self Care | Payer: BLUE CROSS/BLUE SHIELD | Attending: Certified Nurse Midwife | Admitting: Certified Nurse Midwife

## 2018-03-24 ENCOUNTER — Other Ambulatory Visit: Payer: Self-pay

## 2018-03-24 DIAGNOSIS — O471 False labor at or after 37 completed weeks of gestation: Secondary | ICD-10-CM | POA: Diagnosis not present

## 2018-03-24 DIAGNOSIS — Z3A37 37 weeks gestation of pregnancy: Secondary | ICD-10-CM

## 2018-03-24 DIAGNOSIS — O4703 False labor before 37 completed weeks of gestation, third trimester: Secondary | ICD-10-CM

## 2018-03-24 LAB — WET PREP, GENITAL
CLUE CELLS WET PREP: NONE SEEN
Sperm: NONE SEEN
TRICH WET PREP: NONE SEEN
WBC WET PREP: NONE SEEN
YEAST WET PREP: NONE SEEN

## 2018-03-24 LAB — ROM PLUS (ARMC ONLY): Rom Plus: NEGATIVE

## 2018-03-24 MED ORDER — PROMETHAZINE HCL 25 MG/ML IJ SOLN
12.5000 mg | Freq: Once | INTRAMUSCULAR | Status: DC
  Filled 2018-03-24: qty 1

## 2018-03-24 MED ORDER — MORPHINE SULFATE (PF) 4 MG/ML IV SOLN
8.0000 mg | Freq: Once | INTRAVENOUS | Status: AC
  Administered 2018-03-24: 8 mg via INTRAMUSCULAR

## 2018-03-24 MED ORDER — MORPHINE SULFATE (PF) 4 MG/ML IV SOLN
INTRAVENOUS | Status: AC
  Filled 2018-03-24: qty 2

## 2018-03-24 NOTE — Discharge Summary (Signed)
Obstetric Discharge Summary  Patient ID: Gina Lester MRN: [id number] DOB/AGE: [birth year] 20 y.o.   Date of Admission: 03/24/2018  Date of Discharge: 03/24/2018  Admitting Diagnosis: Observation at [redacted]w[redacted]d     Discharge Diagnosis: No other diagnosis  Antepartum Procedures: NST and therapeutic rest    Brief Hospital Course   L&D OB Triage Note  Gina Lester is a 22 y.o. G2P1000 female at [redacted]w[redacted]d, EDD Estimated Date of Delivery: 04/10/18 who presented to triage for complaints of pelvic pressure and uterine contractions.  She was evaluated by the nurses with no significant findings for fetal distress or labor. Vital signs stable. An NST was performed and has been reviewed by CNM. She was treated with IM morphine.   NST INTERPRETATION:  Indications: rule out uterine contractions  Mode: External Baseline Rate (A): 135 bpm Variability: Moderate Accelerations: 15 x 15 Decelerations: None Contraction Frequency (min): 1-6  Impression: reactive  Dilation: 1 Effacement (%): 50 Cervical Position: Posterior Station: -2 Exam by:: Craige Cotta RN   Plan: NST performed was reviewed and was found to be reactive. She was discharged home with bleeding/labor precautions.  Continue routine prenatal care. Follow up with CNM as previously scheduled.    Discharge Instructions: Per After Visit Summary  Activity:   Also refer to After Visit Summary  Diet: Regular  Medications: Allergies as of 03/24/2018   No Known Allergies     Medication List    ASK your doctor about these medications   acetaminophen 500 MG tablet Commonly known as:  TYLENOL Take 2 tablets (1,000 mg total) by mouth every 6 (six) hours as needed for fever or headache.   cyclobenzaprine 10 MG tablet Commonly known as:  FLEXERIL Take 1 tablet (10 mg total) by mouth 3 (three) times daily as needed for muscle spasms.   hydrOXYzine 25 MG tablet Commonly known as:  ATARAX/VISTARIL Take 1 tablet (25 mg total) by  mouth 3 (three) times daily as needed.   loratadine 10 MG dissolvable tablet Commonly known as:  CLARITIN REDITABS Take 1 tablet (10 mg total) by mouth daily.   prenatal multivitamin Tabs tablet Take 1 tablet by mouth daily at 12 noon.      Discharged Condition: stable  Discharged to: home   Gunnar Bulla, CNM [facility], [facility]

## 2018-03-24 NOTE — Progress Notes (Signed)
Pt G2P1 presents at [redacted]w[redacted]d and reports CTX every 5-6 minutes. Pt reports "leaking a little yesterday but not today". Pt reports +FM. Pt denies any complications this pregnancy. VSS. Monitors applied. No other complaints.

## 2018-03-31 ENCOUNTER — Observation Stay
Admission: EM | Admit: 2018-03-31 | Discharge: 2018-03-31 | Disposition: A | Discharge status: Home / Self Care | Payer: BLUE CROSS/BLUE SHIELD | Attending: Certified Nurse Midwife | Admitting: Certified Nurse Midwife

## 2018-03-31 DIAGNOSIS — O471 False labor at or after 37 completed weeks of gestation: Principal | ICD-10-CM | POA: Insufficient documentation

## 2018-03-31 DIAGNOSIS — O4703 False labor before 37 completed weeks of gestation, third trimester: Secondary | ICD-10-CM

## 2018-03-31 DIAGNOSIS — Z3A38 38 weeks gestation of pregnancy: Secondary | ICD-10-CM | POA: Insufficient documentation

## 2018-03-31 NOTE — OB Triage Note (Addendum)
  Patient G2P1 came in at 1855 from home complaining of contractions that started at 1600. Patient reports contractions to be every 5 minutes apart. Rating pain a 6 out of10. Patient reports positive fetal movement. Denies vaginal bleeding. Denies leakage of fluids. Denies sexual intercourse last 3 weeks. Upon RN assessment contractions palpate soft. External fetal monitors applied. Initial blood pressure 110/62 . Temperature 98.71F. Will inform provider and continue to assess and monitor. Call bell within reach. Bed in lowest position. Side rails up. Family at bedside.

## 2018-04-01 NOTE — Discharge Summary (Signed)
Obstetric Discharge Summary  Patient ID: Gina Lester MRN: [id number] DOB/AGE: 03-25-1996 22 y.o.   Date of Admission: 03/31/2018  Date of Discharge: 03/31/2018  Admitting Diagnosis: Observation at [redacted]w[redacted]d  Secondary Diagnosis: Varicella non-immune     Discharge Diagnosis: No other diagnosis   Antepartum Procedures: NST   Brief Hospital Course   L&D OB Triage Note  Gina Lester is a 22 y.o. G2P1000 female at [redacted]w[redacted]d, EDD Estimated Date of Delivery: 04/10/18 who presented to triage for complaints of irregular uterine contractions.  She was evaluated by the nurses with no significant findings for labor or fetal distress. Vital signs stable. An NST was performed and has been reviewed by CNM.   NST INTERPRETATION:  Indications: rule out uterine contractions  Mode: External Baseline Rate (A): 140 bpm Variability: Moderate Accelerations: 15 x 15 Decelerations: None Contraction Frequency (min): x1  Impression: reactive  Dilation: 1 Effacement (%): 50 Cervical Position: Posterior Station: -2 Exam by:: Hurshel Party, RN    Plan: NST performed was reviewed and was found to be reactive. She was discharged home with bleeding/labor precautions.  Continue routine prenatal care. Follow up with CNM this week for ROB appointment.    Discharge Instructions: Per After Visit Summary.  Activity: Also refer to After Visit Summary  Diet: Regular  Medications: Allergies as of 03/31/2018   No Known Allergies     Medication List    ASK your doctor about these medications   acetaminophen 500 MG tablet Commonly known as:  TYLENOL Take 2 tablets (1,000 mg total) by mouth every 6 (six) hours as needed for fever or headache.   cyclobenzaprine 10 MG tablet Commonly known as:  FLEXERIL Take 1 tablet (10 mg total) by mouth 3 (three) times daily as needed for muscle spasms.   hydrOXYzine 25 MG tablet Commonly known as:  ATARAX/VISTARIL Take 1 tablet (25 mg total) by mouth 3  (three) times daily as needed.   loratadine 10 MG dissolvable tablet Commonly known as:  CLARITIN REDITABS Take 1 tablet (10 mg total) by mouth daily.   prenatal multivitamin Tabs tablet Take 1 tablet by mouth daily at 12 noon.      Outpatient follow up:  Follow-up Information    Gunnar Bulla, CNM. Call in 2 day(s).   Specialties:  Certified Nurse Midwife, Obstetrics and Gynecology, Radiology Why:  Please call the office to schedule ROB appointment this week Contact information: [address] [place] [address] [phone]           Discharged Condition: stable  Discharged to: home   Gunnar Bulla, CNM [facility], [facility]

## 2018-04-03 ENCOUNTER — Encounter: Payer: Self-pay | Admitting: Certified Nurse Midwife

## 2018-04-07 ENCOUNTER — Other Ambulatory Visit: Payer: Self-pay | Admitting: Certified Nurse Midwife

## 2018-04-07 ENCOUNTER — Ambulatory Visit (INDEPENDENT_AMBULATORY_CARE_PROVIDER_SITE_OTHER): Payer: BLUE CROSS/BLUE SHIELD | Admitting: Certified Nurse Midwife

## 2018-04-07 ENCOUNTER — Encounter: Payer: Self-pay | Admitting: Certified Nurse Midwife

## 2018-04-07 VITALS — BP 96/57 | HR 82 | Wt 166.0 lb

## 2018-04-07 DIAGNOSIS — O480 Post-term pregnancy: Secondary | ICD-10-CM

## 2018-04-07 DIAGNOSIS — Z3483 Encounter for supervision of other normal pregnancy, third trimester: Secondary | ICD-10-CM

## 2018-04-07 DIAGNOSIS — Z3685 Encounter for antenatal screening for Streptococcus B: Secondary | ICD-10-CM

## 2018-04-07 LAB — POCT URINALYSIS DIPSTICK OB
Glucose, UA: NEGATIVE
Ketones, UA: NEGATIVE
LEUKOCYTES UA: NEGATIVE
Nitrite, UA: NEGATIVE
Odor: NEGATIVE
POC,PROTEIN,UA: NEGATIVE
Spec Grav, UA: 1.02 (ref 1.010–1.025)
Urobilinogen, UA: 0.2 E.U./dL
pH, UA: 6 (ref 5.0–8.0)

## 2018-04-07 NOTE — Progress Notes (Signed)
ROB- pp and cramps.

## 2018-04-07 NOTE — Patient Instructions (Signed)
Labor Induction Labor induction is when steps are taken to cause a pregnant woman to begin the labor process. Most women go into labor on their own between 37 weeks and 42 weeks of the pregnancy. When this does not happen or when there is a medical need, methods may be used to induce labor. Labor induction causes a pregnant woman's uterus to contract. It also causes the cervix to soften (ripen), open (dilate), and thin out (efface). Usually, labor is not induced before 39 weeks of the pregnancy unless there is a problem with the baby or mother. Before inducing labor, your health care provider will consider a number of factors, including the following:  The medical condition of you and the baby.  How many weeks along you are.  The status of the baby's lung maturity.  The condition of the cervix.  The position of the baby. What are the reasons for labor induction? Labor may be induced for the following reasons:  The health of the baby or mother is at risk.  The pregnancy is overdue by 1 week or more.  The water breaks but labor does not start on its own.  The mother has a health condition or serious illness, such as high blood pressure, infection, placental abruption, or diabetes.  The amniotic fluid amounts are low around the baby.  The baby is distressed. Convenience or wanting the baby to be born on a certain date is not a reason for inducing labor. What methods are used for labor induction? Several methods of labor induction may be used, such as:  Prostaglandin medicine. This medicine causes the cervix to dilate and ripen. The medicine will also start contractions. It can be taken by mouth or by inserting a suppository into the vagina.  Inserting a thin tube (catheter) with a balloon on the end into the vagina to dilate the cervix. Once inserted, the balloon is expanded with water, which causes the cervix to open.  Stripping the membranes. Your health care provider separates  amniotic sac tissue from the cervix, causing the cervix to be stretched and causing the release of a hormone called progesterone. This may cause the uterus to contract. It is often done during an office visit. You will be sent home to wait for the contractions to begin. You will then come in for an induction.  Breaking the water. Your health care provider makes a hole in the amniotic sac using a small instrument. Once the amniotic sac breaks, contractions should begin. This may still take hours to see an effect.  Medicine to trigger or strengthen contractions. This medicine is given through an IV access tube inserted into a vein in your arm. All of the methods of induction, besides stripping the membranes, will be done in the hospital. Induction is done in the hospital so that you and the baby can be carefully monitored. How long does it take for labor to be induced? Some inductions can take up to 2-3 days. Depending on the cervix, it usually takes less time. It takes longer when you are induced early in the pregnancy or if this is your first pregnancy. If a mother is still pregnant and the induction has been going on for 2-3 days, either the mother will be sent home or a cesarean delivery will be needed. What are the risks associated with labor induction? Some of the risks of induction include:  Changes in fetal heart rate, such as too high, too low, or erratic.  Fetal distress.    Chance of infection for the mother and baby.  Increased chance of having a cesarean delivery.  Breaking off (abruption) of the placenta from the uterus (rare).  Uterine rupture (very rare). When induction is needed for medical reasons, the benefits of induction may outweigh the risks. What are some reasons for not inducing labor? Labor induction should not be done if:  It is shown that your baby does not tolerate labor.  You have had previous surgeries on your uterus, such as a myomectomy or the removal of  fibroids.  Your placenta lies very low in the uterus and blocks the opening of the cervix (placenta previa).  Your baby is not in a head-down position.  The umbilical cord drops down into the birth canal in front of the baby. This could cut off the baby's blood and oxygen supply.  You have had a previous cesarean delivery.  There are unusual circumstances, such as the baby being extremely premature. This information is not intended to replace advice given to you by your health care provider. Make sure you discuss any questions you have with your health care provider. Document Released: 11/28/2006 Document Revised: 12/15/2015 Document Reviewed: 02/05/2013 Elsevier Interactive Patient Education  2017 Elsevier Inc.  

## 2018-04-07 NOTE — Progress Notes (Signed)
ROB-Reports pelvic pressure and lower abdominal cramping. GBS results expire in three (3) days, will recollected. Discussed postdates care. Reviewed red flag symptoms and when to call. RTC x 1 week for growth US/BPP and ROB or sooner if needed.

## 2018-04-08 ENCOUNTER — Inpatient Hospital Stay
Admission: EM | Admit: 2018-04-08 | Discharge: 2018-04-09 | DRG: 807 | Disposition: A | Discharge status: Home / Self Care | Payer: BLUE CROSS/BLUE SHIELD | Attending: Certified Nurse Midwife | Admitting: Certified Nurse Midwife

## 2018-04-08 ENCOUNTER — Other Ambulatory Visit: Payer: Self-pay

## 2018-04-08 DIAGNOSIS — Z3A39 39 weeks gestation of pregnancy: Secondary | ICD-10-CM

## 2018-04-08 DIAGNOSIS — Z3483 Encounter for supervision of other normal pregnancy, third trimester: Secondary | ICD-10-CM | POA: Diagnosis present

## 2018-04-08 DIAGNOSIS — Z23 Encounter for immunization: Secondary | ICD-10-CM

## 2018-04-08 LAB — TYPE AND SCREEN
ABO/RH(D): O POS
Antibody Screen: NEGATIVE

## 2018-04-08 LAB — CBC
HCT: 30.3 % — ABNORMAL LOW (ref 35.0–47.0)
Hemoglobin: 9.7 g/dL — ABNORMAL LOW (ref 12.0–16.0)
MCH: 24.3 pg — ABNORMAL LOW (ref 26.0–34.0)
MCHC: 32 g/dL (ref 32.0–36.0)
MCV: 75.8 fL — AB (ref 80.0–100.0)
Platelets: 267 10*3/uL (ref 150–440)
RBC: 4 MIL/uL (ref 3.80–5.20)
RDW: 18.6 % — AB (ref 11.5–14.5)
WBC: 8.1 10*3/uL (ref 3.6–11.0)

## 2018-04-08 MED ORDER — LACTATED RINGERS IV SOLN
INTRAVENOUS | Status: DC
  Administered 2018-04-08: 08:00:00 via INTRAVENOUS

## 2018-04-08 MED ORDER — OXYTOCIN 40 UNITS IN LACTATED RINGERS INFUSION - SIMPLE MED
INTRAVENOUS | Status: AC
  Filled 2018-04-08: qty 1000

## 2018-04-08 MED ORDER — SENNOSIDES-DOCUSATE SODIUM 8.6-50 MG PO TABS
2.0000 | ORAL_TABLET | ORAL | Status: DC
  Filled 2018-04-08: qty 2

## 2018-04-08 MED ORDER — ACETAMINOPHEN 325 MG PO TABS: 650.0000 mg | ORAL_TABLET | ORAL | Status: DC | PRN

## 2018-04-08 MED ORDER — OXYTOCIN BOLUS FROM INFUSION
500.0000 mL | Freq: Once | INTRAVENOUS | Status: AC
  Administered 2018-04-08: 500 mL via INTRAVENOUS

## 2018-04-08 MED ORDER — DOCUSATE SODIUM 100 MG PO CAPS
100.0000 mg | ORAL_CAPSULE | Freq: Two times a day (BID) | ORAL | Status: DC
  Administered 2018-04-08: 100 mg via ORAL
  Filled 2018-04-08: qty 1

## 2018-04-08 MED ORDER — OXYCODONE-ACETAMINOPHEN 5-325 MG PO TABS: 2.0000 | ORAL_TABLET | ORAL | Status: DC | PRN

## 2018-04-08 MED ORDER — BENZOCAINE-MENTHOL 20-0.5 % EX AERO
1.0000 "application " | INHALATION_SPRAY | CUTANEOUS | Status: DC | PRN
  Administered 2018-04-08: 1 via TOPICAL
  Filled 2018-04-08: qty 56

## 2018-04-08 MED ORDER — LACTATED RINGERS IV SOLN: 500.0000 mL | INTRAVENOUS | Status: DC | PRN

## 2018-04-08 MED ORDER — AMMONIA AROMATIC IN INHA
RESPIRATORY_TRACT | Status: AC
  Filled 2018-04-08: qty 10

## 2018-04-08 MED ORDER — SIMETHICONE 80 MG PO CHEW: 80.0000 mg | CHEWABLE_TABLET | ORAL | Status: DC | PRN

## 2018-04-08 MED ORDER — LIDOCAINE HCL (PF) 1 % IJ SOLN
INTRAMUSCULAR | Status: AC
  Filled 2018-04-08: qty 30

## 2018-04-08 MED ORDER — BUTORPHANOL TARTRATE 2 MG/ML IJ SOLN: 1.0000 mg | INTRAMUSCULAR | Status: DC | PRN

## 2018-04-08 MED ORDER — IBUPROFEN 600 MG PO TABS
600.0000 mg | ORAL_TABLET | Freq: Four times a day (QID) | ORAL | Status: DC
  Administered 2018-04-08 – 2018-04-09 (×3): 600 mg via ORAL
  Filled 2018-04-08 (×3): qty 1

## 2018-04-08 MED ORDER — OXYCODONE-ACETAMINOPHEN 5-325 MG PO TABS: 1.0000 | ORAL_TABLET | ORAL | Status: DC | PRN

## 2018-04-08 MED ORDER — MISOPROSTOL 200 MCG PO TABS
ORAL_TABLET | ORAL | Status: AC
  Filled 2018-04-08: qty 4

## 2018-04-08 MED ORDER — OXYTOCIN 10 UNIT/ML IJ SOLN: 10.0000 [IU] | Freq: Once | INTRAMUSCULAR | Status: DC

## 2018-04-08 MED ORDER — PRENATAL MULTIVITAMIN CH
1.0000 | ORAL_TABLET | Freq: Every day | ORAL | Status: DC
  Administered 2018-04-08 – 2018-04-09 (×2): 1 via ORAL
  Filled 2018-04-08 (×2): qty 1

## 2018-04-08 MED ORDER — WITCH HAZEL-GLYCERIN EX PADS: 1.0000 "application " | MEDICATED_PAD | CUTANEOUS | Status: DC | PRN

## 2018-04-08 MED ORDER — OXYTOCIN 10 UNIT/ML IJ SOLN
INTRAMUSCULAR | Status: AC
  Filled 2018-04-08: qty 2

## 2018-04-08 MED ORDER — ONDANSETRON HCL 4 MG/2ML IJ SOLN: 4.0000 mg | INTRAMUSCULAR | Status: DC | PRN

## 2018-04-08 MED ORDER — COCONUT OIL OIL: 1.0000 "application " | TOPICAL_OIL | Status: DC | PRN

## 2018-04-08 MED ORDER — FERROUS SULFATE 325 (65 FE) MG PO TABS
325.0000 mg | ORAL_TABLET | Freq: Every day | ORAL | Status: DC
  Administered 2018-04-09: 325 mg via ORAL
  Filled 2018-04-08: qty 1

## 2018-04-08 MED ORDER — METHYLERGONOVINE MALEATE 0.2 MG/ML IJ SOLN: 0.2000 mg | INTRAMUSCULAR | Status: DC | PRN

## 2018-04-08 MED ORDER — METHYLERGONOVINE MALEATE 0.2 MG PO TABS
0.2000 mg | ORAL_TABLET | ORAL | Status: DC | PRN
  Filled 2018-04-08: qty 1

## 2018-04-08 MED ORDER — OXYTOCIN 40 UNITS IN LACTATED RINGERS INFUSION - SIMPLE MED: 1.0000 m[IU]/min | INTRAVENOUS | Status: DC

## 2018-04-08 MED ORDER — LIDOCAINE HCL (PF) 1 % IJ SOLN: 30.0000 mL | INTRAMUSCULAR | Status: DC | PRN

## 2018-04-08 MED ORDER — ONDANSETRON HCL 4 MG PO TABS: 4.0000 mg | ORAL_TABLET | ORAL | Status: DC | PRN

## 2018-04-08 MED ORDER — TETANUS-DIPHTH-ACELL PERTUSSIS 5-2.5-18.5 LF-MCG/0.5 IM SUSP
0.5000 mL | Freq: Once | INTRAMUSCULAR | Status: DC
  Filled 2018-04-08: qty 0.5

## 2018-04-08 MED ORDER — INFLUENZA VAC SPLIT QUAD 0.5 ML IM SUSY
0.5000 mL | PREFILLED_SYRINGE | INTRAMUSCULAR | Status: AC
  Administered 2018-04-09: 0.5 mL via INTRAMUSCULAR
  Filled 2018-04-08: qty 0.5

## 2018-04-08 MED ORDER — TERBUTALINE SULFATE 1 MG/ML IJ SOLN: 0.2500 mg | Freq: Once | INTRAMUSCULAR | Status: DC | PRN

## 2018-04-08 MED ORDER — OXYTOCIN 40 UNITS IN LACTATED RINGERS INFUSION - SIMPLE MED: 2.5000 [IU]/h | INTRAVENOUS | Status: DC

## 2018-04-08 MED ORDER — ONDANSETRON HCL 4 MG/2ML IJ SOLN: 4.0000 mg | Freq: Four times a day (QID) | INTRAMUSCULAR | Status: DC | PRN

## 2018-04-08 MED ORDER — DIBUCAINE 1 % RE OINT: 1.0000 "application " | TOPICAL_OINTMENT | RECTAL | Status: DC | PRN

## 2018-04-08 MED ORDER — SOD CITRATE-CITRIC ACID 500-334 MG/5ML PO SOLN: 30.0000 mL | ORAL | Status: DC | PRN

## 2018-04-08 NOTE — Lactation Note (Signed)
This note was copied from a baby's chart. Lactation Consultation Note  Patient Name: Gina Lester Today's Date: 04/08/2018 Reason for consult: Initial assessment   Maternal Data Formula Feeding for Exclusion: No Has patient been taught Hand Expression?: Yes Does the patient have breastfeeding experience prior to this delivery?: Yes  Feeding Feeding Type: Breast Fed  LATCH Score Latch: Grasps breast easily, tongue down, lips flanged, rhythmical sucking.  Audible Swallowing: Spontaneous and intermittent  Type of Nipple: Everted at rest and after stimulation  Comfort (Breast/Nipple): Soft / non-tender  Hold (Positioning): Assistance needed to correctly position infant at breast and maintain latch.  LATCH Score: 9  Interventions Interventions: Breast feeding basics reviewed;Assisted with latch;Skin to skin;Hand express;Adjust position;Support pillows  Lactation Tools Discussed/Used     Consult Status  LC to room to assist with latch of infant. Mother states that she has an older child that she breastfed for 1 month. She states that stopped after offering formula and baby wouldn't go back to the breast. Infant was able to successfully latch with audible swallows heard. [facility] and educated about what to expect in the first 24 hours, cluster-feeding and signs of a good latch. Mother verbalized understanding of teachings. LC left contact information on white board and  encouraged mother to let infant stay on breast as long as he likes, offer the other breast and call if having trouble latching.     Gina Lester 04/08/2018, 12:56 PM

## 2018-04-08 NOTE — H&P (Signed)
History and Physical   HPI  Gina Lester is a 22 y.o. G2P1000 at [redacted]w[redacted]d Estimated Date of Delivery: 04/10/18 who is being admitted for labor management  OB History  OB History  Gravida Para Term Preterm AB Living  2 1 1  0 0 0  SAB TAB Ectopic Multiple Live Births  0 0 0 0 0    # Outcome Date GA Lbr Len/2nd Weight Sex Delivery Anes PTL Lv  2 Current           1 Term 12/11/15 [redacted]w[redacted]d / 00:33 3380 g M Vag-Spont None       Name: BALTAZAR LEON,BOY Karaline     Apgar1: 8  Apgar5: 9    PROBLEM LIST  Pregnancy complications or risks: Patient Active Problem List   Diagnosis Date Noted  . Indication for care in labor or delivery 03/24/2018  . Labor and delivery, indication for care 02/28/2018  . Maternal varicella, non-immune 05/19/2015  . Abdominal pain 04/17/2014  . Transaminitis 04/17/2014    Prenatal labs and studies: ABO, Rh: O/Positive/-- (02/14 1024) Antibody: Negative (02/14 1024) Rubella: 2.09 (02/14 1024) RPR: Non Reactive (06/26 0938)  HBsAg: Negative (02/14 1024)  HIV: Non Reactive (02/14 1024)  ION:GEXBMWUX (08/19 1206)   Past Medical History:  Diagnosis Date  . Constipation   . Seasonal allergies      Past Surgical History:  Procedure Laterality Date  . NO PAST SURGERIES       Medications    Current Discharge Medication List    CONTINUE these medications which have NOT CHANGED   Details  Prenatal Vit-Fe Fumarate-FA (PRENATAL MULTIVITAMIN) TABS tablet Take 1 tablet by mouth daily at 12 noon.         Allergies  Patient has no known allergies.  Review of Systems  Constitutional: negative Eyes: negative Ears, nose, mouth, throat, and face: negative Respiratory: negative Cardiovascular: negative Gastrointestinal: negative Genitourinary:negative Integument/breast: negative Hematologic/lymphatic: negative Musculoskeletal:negative Neurological: negative Behavioral/Psych: negative Endocrine: negative Allergic/Immunologic:  negative  Physical Exam  BP 121/75 (BP Location: Left Arm)   Pulse 66   Temp 97.8 F (36.6 C) (Oral)   Resp 20   Ht 5' (1.524 m)   Wt 75.3 kg   LMP 07/04/2017   BMI 32.42 kg/m   Lungs:  CTA B Cardio: RRR  Abd: Soft, gravid, NT Presentation: cephalic EXT: No C/C/ 1+ Edema  CERVIX: Dilation: 4 Effacement (%): 60 Cervical Position: Middle Station: -2 Presentation: Vertex Exam by:: A.Janee Morn, CNM  See Prenatal records for more detailed PE.     FHR:  Baseline: 120 bpm, Variability: Good {> 6 bpm), Accelerations: Reactive and Decelerations: Absent  Toco: Uterine Contractions: Frequency: Every 2-4 minutes and Duration: 50-70 seconds   Test Results  Results for orders placed or performed in visit on 04/07/18 (from the past 24 hour(s))  POC Urinalysis Dipstick OB     Status: None   Collection Time: 04/07/18  1:45 PM  Result Value Ref Range   Color, UA yellow    Clarity, UA clear    Glucose, UA Negative Negative   Bilirubin, UA 1+    Ketones, UA neg    Spec Grav, UA 1.020 1.010 - 1.025   Blood, UA trace non hem    pH, UA 6.0 5.0 - 8.0   POC Protein UA Negative Negative, Trace   Urobilinogen, UA 0.2 0.2 or 1.0 E.U./dL   Nitrite, UA neg    Leukocytes, UA Negative Negative   Appearance yellow  Odor neg    Group B Strep negative  Assessment   G2P1000 at 8468w5d Estimated Date of Delivery: 04/10/18  The fetus is reassuring.    Patient Active Problem List   Diagnosis Date Noted  . Indication for care in labor or delivery 03/24/2018  . Labor and delivery, indication for care 02/28/2018  . Maternal varicella, non-immune 05/19/2015  . Abdominal pain 04/17/2014  . Transaminitis 04/17/2014    Plan  1. Admit to L&D :   continue present management 2. EFM:-- Category 1 3. Epidural if desired.  Stadol for IV pain until epidural requested. 4. Admission labs  5. Anticipate NSVD  Doreene Burkennie Minnah Llamas, CNM  04/08/2018 8:02 AM

## 2018-04-08 NOTE — OB Triage Note (Signed)
Pt presents via ED with complaints of contractions every 5 minutes and small amount of bleeding. Positive fetal movement. Denies N/V/D. Will continue to monitor.

## 2018-04-08 NOTE — Progress Notes (Signed)
LABOR NOTE   Gina Lester 22 y.o.@ at 2917w5d Satisfactory labor progress. and latent labor  SUBJECTIVE:  Uncomfortable with contractions. Declines pain medication OBJECTIVE:  BP (!) 102/57 (BP Location: Right Arm)   Pulse 78   Temp 98 F (36.7 C) (Oral)   Resp 19   Ht 5' (1.524 m)   Wt 75.3 kg   LMP 07/04/2017   BMI 32.42 kg/m  No intake/output data recorded.  She has shown cervical change. CERVIX: 5cm:  70%:   -2:   mid position:   soft SVE:   Dilation: 5 Effacement (%): 70 Station: -2 Exam by:: A.Dauna Ziska CONTRACTIONS: regular, every 2-3 minutes FHR: Fetal heart tracing reviewed. Baseline: 125 bpm, Variability: Good {> 6 bpm), Accelerations: Reactive and Decelerations: Absent Category I   Analgesia: Labor support without medications  Labs: Lab Results  Component Value Date   WBC 8.1 04/08/2018   HGB 9.7 (L) 04/08/2018   HCT 30.3 (L) 04/08/2018   MCV 75.8 (L) 04/08/2018   PLT 267 04/08/2018    ASSESSMENT: 1) Labor curve reviewed.       Progress: Satisfactory labor progress.     Membranes: ruptured- clear fluid       Active Problems:   Labor and delivery, indication for care   PLAN: continue present management  Doreene Burkennie Reagann Dolce, CNM  04/08/2018 9:28 AM

## 2018-04-09 LAB — STREP GP B NAA: STREP GROUP B AG: NEGATIVE

## 2018-04-09 LAB — CBC
HEMATOCRIT: 27.9 % — AB (ref 35.0–47.0)
HEMOGLOBIN: 9.2 g/dL — AB (ref 12.0–16.0)
MCH: 25.3 pg — ABNORMAL LOW (ref 26.0–34.0)
MCHC: 32.9 g/dL (ref 32.0–36.0)
MCV: 76.9 fL — ABNORMAL LOW (ref 80.0–100.0)
Platelets: 257 10*3/uL (ref 150–440)
RBC: 3.63 MIL/uL — AB (ref 3.80–5.20)
RDW: 18.7 % — ABNORMAL HIGH (ref 11.5–14.5)
WBC: 11.8 10*3/uL — AB (ref 3.6–11.0)

## 2018-04-09 LAB — RPR: RPR Ser Ql: NONREACTIVE

## 2018-04-09 MED ORDER — VARICELLA VIRUS VACCINE LIVE 1350 PFU/0.5ML IJ SUSR
0.5000 mL | Freq: Once | INTRAMUSCULAR | Status: AC
  Administered 2018-04-09: 0.5 mL via SUBCUTANEOUS
  Filled 2018-04-09 (×2): qty 0.5

## 2018-04-09 MED ORDER — IBUPROFEN 600 MG PO TABS
600.0000 mg | ORAL_TABLET | Freq: Four times a day (QID) | ORAL | Status: DC
  Administered 2018-04-09: 600 mg via ORAL
  Filled 2018-04-09: qty 1

## 2018-04-09 MED ORDER — IBUPROFEN 600 MG PO TABS: 600.0000 mg | ORAL_TABLET | Freq: Four times a day (QID) | ORAL | 0 refills | Status: DC

## 2018-04-09 NOTE — Lactation Note (Signed)
This note was copied from a baby's chart. Lactation Consultation Note  Patient Name: Gina Johny ChessMaribel Baltazar Leon Today's Date: 04/09/2018     Maternal Data    Feeding Feeding Type: Formula Nipple Type: Slow - flow  LATCH Score                   Interventions    Lactation Tools Discussed/Used     Consult Status  LC to room during morning rounds to check on how breastfeeding went last night. Mother states that infant is still latching well and denies pain during breastfeeding. She states that last night infant started clusterfeeding and she thought that she did not have enough milk, so she gave formula one time during the night and infant fell asleep afterwards. LC educated mother on how clusterfeeding is expected after 24 hours of life and that does not mean she does not have enough milk and putting infant to breast will help her milk production especially during this time. Mother verbalized understanding of teaching.    [name] 04/09/2018, 9:53 AM

## 2018-04-09 NOTE — Final Progress Note (Signed)
Discharge Day SOAP Note:  Progress Note - Vaginal Delivery  Gina Lester is a 22 y.o. G2P2002 now PP day 1 s/p Vaginal, Spontaneous . Delivery was uncomplicated  Subjective  The patient has the following complaints: has no unusual complaints  Pain is controlled with current medications.   Patient is urinating without difficulty.  She is ambulating well.     Objective  Vital signs: BP 99/66 (BP Location: Left Arm)   Pulse 78   Temp 98.7 F (37.1 C) (Oral)   Resp 18   Ht 5' (1.524 m)   Wt 75.3 kg   LMP 07/04/2017   SpO2 90%   Breastfeeding? Unknown   BMI 32.42 kg/m   Physical Exam: Gen: NAD Fundus Fundal Tone: Firm @ u-1  Lochia Amount: Small  Perineum Appearance: Intact     Data Review Labs: CBC Latest Ref Rng & Units 04/09/2018 04/08/2018 01/15/2018  WBC 3.6 - 11.0 K/uL 11.8(H) 8.1 8.3  Hemoglobin 12.0 - 16.0 g/dL 4.0(J9.2(L) 8.1(X9.7(L) 10.6(L)  Hematocrit 35.0 - 47.0 % 27.9(L) 30.3(L) 33.1(L)  Platelets 150 - 440 K/uL 257 267 294   O POS  Assessment/Plan  Active Problems:   Labor and delivery, indication for care    Plan for discharge today.   Discharge Instructions: Per After Visit Summary. Activity: Advance as tolerated. Pelvic rest for 6 weeks.  Also refer to After Visit Summary Diet: Regular Medications: Allergies as of 04/09/2018   No Known Allergies     Medication List    TAKE these medications   ibuprofen 600 MG tablet Commonly known as:  ADVIL,MOTRIN Take 1 tablet (600 mg total) by mouth every 6 (six) hours.   prenatal multivitamin Tabs tablet Take 1 tablet by mouth daily at 12 noon.      Outpatient follow up: 6 weeks with Doreene BurkeAnnie Sheryle Vice, CNM  Postpartum contraception: Will discuss at first office visit post-partum  Discharged Condition: good  Discharged to: home  Newborn Data: Disposition:home with mother  Apgars: APGAR (1 MIN): 8   APGAR (5 MINS): 9   APGAR (10 MINS):    Baby Feeding: Breast    Doreene Burkennie  Parnika Tweten, CNM  04/09/2018 10:37 AM

## 2018-04-09 NOTE — Progress Notes (Signed)
Both parents viewed the DVD, "The Period of Purple Cry" prior to discharge home.  DVD copy given to pt for family members at home to view.

## 2018-04-09 NOTE — Progress Notes (Signed)
Pt discharged with infant. Discharge instructions, prescriptions, and follow up appointments given to and reviewed with patient. Pt verbalized understanding. Escorted out by auxillary. Otilio JeffersonMadelyn S Fenton, RN

## 2018-04-09 NOTE — Discharge Summary (Signed)
Discharge Summary  Date of Admission: 04/08/2018  Date of Discharge: 04/09/2018  Admitting Diagnosis: Onset of Labor at [id number]   Mode of Delivery: normal spontaneous vaginal delivery                 Discharge Diagnosis: No other diagnosis   Intrapartum Procedures: Atificial rupture of membranes   Post partum procedures: none  Complications: none                      Discharge Day SOAP Note:  Progress Note - Vaginal Delivery  Gina Lester is a 22 y.o. G2P2002 now PP day 1 s/p Vaginal, Spontaneous . Delivery was uncomplicated  Subjective  The patient has the following complaints: has no unusual complaints  Pain is controlled with current medications.   Patient is urinating without difficulty.  She is ambulating well.     Objective  Vital signs: BP 99/66 (BP Location: Left Arm)   Pulse 78   Temp 98.7 F (37.1 C) (Oral)   Resp 18   Ht 5' (1.524 m)   Wt 75.3 kg   LMP 07/04/2017   SpO2 90%   Breastfeeding? Unknown   BMI 32.42 kg/m   Physical Exam: Gen: NAD Fundus Fundal Tone: Firm @ u-1  Lochia Amount: Small  Perineum Appearance: Intact     Data Review Labs: CBC Latest Ref Rng & Units 04/09/2018 04/08/2018 01/15/2018  WBC 3.6 - 11.0 K/uL 11.8(H) 8.1 8.3  Hemoglobin 12.0 - 16.0 g/dL 4.0(J9.2(L) 8.1(X9.7(L) 10.6(L)  Hematocrit 35.0 - 47.0 % 27.9(L) 30.3(L) 33.1(L)  Platelets 150 - 440 K/uL 257 267 294   O POS  Assessment/Plan  Active Problems:   Labor and delivery, indication for care    Plan for discharge today.   Discharge Instructions: Per After Visit Summary. Activity: Advance as tolerated. Pelvic rest for 6 weeks.  Also refer to After Visit Summary Diet: Regular Medications: Allergies as of 04/09/2018   No Known Allergies     Medication List    TAKE these medications   ibuprofen 600 MG tablet Commonly known as:  ADVIL,MOTRIN Take 1 tablet (600 mg total) by mouth every 6 (six) hours.   prenatal multivitamin Tabs  tablet Take 1 tablet by mouth daily at 12 noon.      Outpatient follow up: 6 weeks with Gina Lester, CNM  Postpartum contraception: Will discuss at first office visit post-partum  Discharged Condition: good  Discharged to: home  Newborn Data: Disposition:home with mother  Apgars: APGAR (1 MIN): 8   APGAR (5 MINS): 9   APGAR (10 MINS):    Baby Feeding: Breast    Gina Lester, CNM  04/09/2018 10:37 AM

## 2018-04-11 ENCOUNTER — Encounter: Payer: BLUE CROSS/BLUE SHIELD | Admitting: Certified Nurse Midwife

## 2018-04-11 ENCOUNTER — Other Ambulatory Visit: Payer: BLUE CROSS/BLUE SHIELD

## 2018-05-09 ENCOUNTER — Other Ambulatory Visit: Payer: Self-pay

## 2018-05-09 ENCOUNTER — Emergency Department
Admission: EM | Admit: 2018-05-09 | Discharge: 2018-05-09 | Discharge status: Against Medical Advice | Payer: BLUE CROSS/BLUE SHIELD | Attending: Emergency Medicine | Admitting: Emergency Medicine

## 2018-05-09 DIAGNOSIS — Z5321 Procedure and treatment not carried out due to patient leaving prior to being seen by health care provider: Secondary | ICD-10-CM | POA: Diagnosis not present

## 2018-05-09 DIAGNOSIS — R1011 Right upper quadrant pain: Secondary | ICD-10-CM | POA: Insufficient documentation

## 2018-05-09 LAB — URINALYSIS, COMPLETE (UACMP) WITH MICROSCOPIC
Bilirubin Urine: NEGATIVE
GLUCOSE, UA: NEGATIVE mg/dL
Ketones, ur: NEGATIVE mg/dL
NITRITE: NEGATIVE
Protein, ur: NEGATIVE mg/dL
SPECIFIC GRAVITY, URINE: 1.019 (ref 1.005–1.030)
pH: 7 (ref 5.0–8.0)

## 2018-05-09 LAB — CBC
HEMATOCRIT: 38.5 % (ref 36.0–46.0)
HEMOGLOBIN: 12 g/dL (ref 12.0–15.0)
MCH: 22.7 pg — ABNORMAL LOW (ref 26.0–34.0)
MCHC: 31.2 g/dL (ref 30.0–36.0)
MCV: 72.9 fL — ABNORMAL LOW (ref 80.0–100.0)
Platelets: 305 10*3/uL (ref 150–400)
RBC: 5.28 MIL/uL — ABNORMAL HIGH (ref 3.87–5.11)
RDW: 22.3 % — ABNORMAL HIGH (ref 11.5–15.5)
WBC: 14.2 10*3/uL — ABNORMAL HIGH (ref 4.0–10.5)
nRBC: 0 % (ref 0.0–0.2)

## 2018-05-09 LAB — COMPREHENSIVE METABOLIC PANEL
ALK PHOS: 145 U/L — AB (ref 38–126)
ALT: 61 U/L — ABNORMAL HIGH (ref 0–44)
ANION GAP: 11 (ref 5–15)
AST: 84 U/L — ABNORMAL HIGH (ref 15–41)
Albumin: 4 g/dL (ref 3.5–5.0)
BILIRUBIN TOTAL: 0.5 mg/dL (ref 0.3–1.2)
BUN: 12 mg/dL (ref 6–20)
CALCIUM: 9.1 mg/dL (ref 8.9–10.3)
CO2: 22 mmol/L (ref 22–32)
Chloride: 106 mmol/L (ref 98–111)
Creatinine, Ser: 0.58 mg/dL (ref 0.44–1.00)
GFR calc Af Amer: >60 mL/min (ref >60)
Glucose, Bld: 113 mg/dL — ABNORMAL HIGH (ref 70–99)
POTASSIUM: 3.7 mmol/L (ref 3.5–5.1)
SODIUM: 139 mmol/L (ref 135–145)
TOTAL PROTEIN: 7.9 g/dL (ref 6.5–8.1)

## 2018-05-09 LAB — LIPASE, BLOOD: Lipase: 27 U/L (ref 11–51)

## 2018-05-09 LAB — POCT PREGNANCY, URINE: Preg Test, Ur: NEGATIVE

## 2018-05-09 NOTE — ED Notes (Signed)
Attempted to call pt to return to ED, no response, no option to leave voicemail.

## 2018-05-09 NOTE — ED Notes (Signed)
Attempted to call pt to come back to ED due to bloodwork results, number provided went straight to voice mail without option to leave a message, will try again.

## 2018-05-09 NOTE — ED Notes (Signed)
This RN called pt's phone again with no answer. Will continue to call and speak to pt.

## 2018-05-09 NOTE — ED Triage Notes (Signed)
Pt in with co mid upper abd pain that started today at 2330 states has had same episode in the past. Delivered 4 weeks ago and has had same pain since, states vomited x 1 today.

## 2018-05-09 NOTE — ED Notes (Signed)
Pt called to be roomed at 0306 with no response. Called again at 0311 with no response, and a third time at 0316 with no response.

## 2018-05-14 ENCOUNTER — Other Ambulatory Visit: Payer: Self-pay

## 2018-05-14 ENCOUNTER — Emergency Department: Payer: BLUE CROSS/BLUE SHIELD

## 2018-05-14 ENCOUNTER — Inpatient Hospital Stay
Admission: EM | Admit: 2018-05-14 | Discharge: 2018-05-18 | DRG: 419 | Disposition: A | Discharge status: Home / Self Care | Payer: BLUE CROSS/BLUE SHIELD | Attending: Internal Medicine | Admitting: Internal Medicine

## 2018-05-14 DIAGNOSIS — K8070 Calculus of gallbladder and bile duct without cholecystitis without obstruction: Secondary | ICD-10-CM | POA: Diagnosis not present

## 2018-05-14 DIAGNOSIS — K8050 Calculus of bile duct without cholangitis or cholecystitis without obstruction: Secondary | ICD-10-CM

## 2018-05-14 DIAGNOSIS — Z91048 Other nonmedicinal substance allergy status: Secondary | ICD-10-CM

## 2018-05-14 DIAGNOSIS — R945 Abnormal results of liver function studies: Secondary | ICD-10-CM

## 2018-05-14 DIAGNOSIS — R7989 Other specified abnormal findings of blood chemistry: Secondary | ICD-10-CM

## 2018-05-14 DIAGNOSIS — R1011 Right upper quadrant pain: Secondary | ICD-10-CM

## 2018-05-14 DIAGNOSIS — Z79899 Other long term (current) drug therapy: Secondary | ICD-10-CM

## 2018-05-14 LAB — COMPREHENSIVE METABOLIC PANEL
ALBUMIN: 4.2 g/dL (ref 3.5–5.0)
ALK PHOS: 241 U/L — AB (ref 38–126)
ALT: 427 U/L — ABNORMAL HIGH (ref 0–44)
AST: 411 U/L — AB (ref 15–41)
Anion gap: 7 (ref 5–15)
BILIRUBIN TOTAL: 2.3 mg/dL — AB (ref 0.3–1.2)
BUN: 13 mg/dL (ref 6–20)
CALCIUM: 9.1 mg/dL (ref 8.9–10.3)
CO2: 23 mmol/L (ref 22–32)
Chloride: 106 mmol/L (ref 98–111)
Creatinine, Ser: 0.58 mg/dL (ref 0.44–1.00)
GFR calc Af Amer: >60 mL/min (ref >60)
GFR calc non Af Amer: >60 mL/min (ref >60)
GLUCOSE: 107 mg/dL — AB (ref 70–99)
POTASSIUM: 3.7 mmol/L (ref 3.5–5.1)
SODIUM: 136 mmol/L (ref 135–145)
TOTAL PROTEIN: 8.5 g/dL — AB (ref 6.5–8.1)

## 2018-05-14 LAB — BETA-HYDROXYBUTYRIC ACID: Beta-Hydroxybutyric Acid: 0.1 mmol/L (ref 0.05–0.27)

## 2018-05-14 LAB — LIPASE, BLOOD: Lipase: 29 U/L (ref 11–51)

## 2018-05-14 MED ORDER — ONDANSETRON HCL 4 MG/2ML IJ SOLN
INTRAMUSCULAR | Status: AC
  Filled 2018-05-14: qty 2

## 2018-05-14 MED ORDER — MORPHINE SULFATE (PF) 4 MG/ML IV SOLN
INTRAVENOUS | Status: AC
  Filled 2018-05-14: qty 1

## 2018-05-14 MED ORDER — MORPHINE SULFATE (PF) 4 MG/ML IV SOLN
4.0000 mg | Freq: Once | INTRAVENOUS | Status: AC
  Administered 2018-05-14: 4 mg via INTRAVENOUS

## 2018-05-14 MED ORDER — ONDANSETRON HCL 4 MG/2ML IJ SOLN
4.0000 mg | Freq: Once | INTRAMUSCULAR | Status: AC
  Administered 2018-05-14: 4 mg via INTRAVENOUS

## 2018-05-14 MED ORDER — KETOROLAC TROMETHAMINE 30 MG/ML IJ SOLN
10.0000 mg | Freq: Once | INTRAMUSCULAR | Status: AC
  Administered 2018-05-15: 9.9 mg via INTRAVENOUS
  Filled 2018-05-14: qty 1

## 2018-05-14 NOTE — ED Triage Notes (Addendum)
Pt arrived via ems complaining of severe upper right side abdominal pain. Pt tearful and vomiting. Pt states postpartum 5 weeks with pain starting 1 week post delivery. Pt received 4mg  zofran IM enroute

## 2018-05-14 NOTE — ED Provider Notes (Signed)
[facility] Emergency Department Provider Note  ____________________________________________   First MD Initiated Contact with Patient 05/14/18 2254     (approximate)  I have reviewed the triage vital signs and the nursing notes.   HISTORY  Chief Complaint Abdominal Pain   HPI Gina Lester is a 22 y.o. female who comes to the emergency department via EMS with sudden onset severe epigastric and right upper quadrant pain that began about an hour prior to arrival.  She has had similar episodes for the past 5 weeks or so ever since giving birth to her child.  Her pain has been intermittent and not every day.  It is clearly worse when eating and associated with nausea and occasional vomiting.  She has no history of abdominal surgeries.  No fevers or chills.  No chest pain or shortness of breath.  She has not seen a doctor until today.  5 days ago she did come to our emergency department where she had blood work drawn but left prior to being seen.  Her symptoms today were sudden onset severe and nothing made them better or worse.  Past Medical History:  Diagnosis Date  . Constipation   . Seasonal allergies     Patient Active Problem List   Diagnosis Date Noted  . Biliary colic 05/15/2018  . Indication for care in labor or delivery 03/24/2018  . Labor and delivery, indication for care 02/28/2018  . Maternal varicella, non-immune 05/19/2015  . Abdominal pain 04/17/2014  . Transaminitis 04/17/2014    Past Surgical History:  Procedure Laterality Date  . NO PAST SURGERIES      Prior to Admission medications   Medication Sig Start Date End Date Taking? Authorizing Provider  Prenatal Vit-Fe Fumarate-FA (PRENATAL MULTIVITAMIN) TABS tablet Take 1 tablet by mouth daily at 12 noon.   Yes [provider]  ibuprofen (ADVIL,MOTRIN) 600 MG tablet Take 1 tablet (600 mg total) by mouth every 6 (six) hours. Patient not taking: Reported on 05/15/2018  04/09/18   Doreene Burke, CNM    Allergies Patient has no known allergies.  Family History  Problem Relation Age of Onset  . Gallbladder disease Mother     Social History Social History   Tobacco Use  . Smoking status: Never Smoker  . Smokeless tobacco: Never Used  Substance Use Topics  . Alcohol use: Not Currently  . Drug use: No    Review of Systems Constitutional: No fever/chills Eyes: No visual changes. ENT: No sore throat. Cardiovascular: Denies chest pain. Respiratory: Denies shortness of breath. Gastrointestinal: Positive for abdominal pain.  Positive for nausea, positive for vomiting.  No diarrhea.  No constipation. Genitourinary: Negative for dysuria. Musculoskeletal: Negative for back pain. Skin: Negative for rash. Neurological: Negative for headaches, focal weakness or numbness.   ____________________________________________   PHYSICAL EXAM:  VITAL SIGNS: ED Triage Vitals  Enc Vitals Group     BP 05/14/18 2244 119/75     Pulse Rate 05/14/18 2244 81     Resp 05/14/18 2244 14     Temp 05/14/18 2244 98.1 F (36.7 C)     Temp Source 05/14/18 2244 Oral     SpO2 05/14/18 2244 100 %     Weight 05/14/18 2225 154 lb 5.2 oz (70 kg)     Height 05/14/18 2225 5' (1.524 m)     Head Circumference --      Peak Flow --      Pain Score 05/14/18 2224 10  Pain Loc --      Pain Edu? --      Excl. in GC? --     Constitutional: Alert and oriented x4 appears quite uncomfortable grimacing and holding her upper abdomen Eyes: PERRL EOMI. Head: Atraumatic. Nose: No congestion/rhinnorhea. Mouth/Throat: No trismus Neck: No stridor.   Cardiovascular: Normal rate, regular rhythm. Grossly normal heart sounds.  Good peripheral circulation. Respiratory: Normal respiratory effort.  No retractions. Lungs CTAB and moving good air Gastrointestinal: Soft nondistended quite tender over the epigastrium and right upper quadrant although negative Murphy's and no  peritonitis Musculoskeletal: No lower extremity edema   Neurologic:  Normal speech and language. No gross focal neurologic deficits are appreciated. Skin:  Skin is warm, dry and intact. No rash noted. Psychiatric: Mood and affect are normal. Speech and behavior are normal.    ____________________________________________   DIFFERENTIAL includes but not limited to  Biliary colic, cholangitis, choledocholithiasis, cholecystitis, pancreatitis, appendicitis, diverticulitis ____________________________________________   LABS (all labs ordered are listed, but only abnormal results are displayed)  Labs Reviewed  COMPREHENSIVE METABOLIC PANEL - Abnormal; Notable for the following components:      Result Value   Glucose, Bld 107 (*)    Total Protein 8.5 (*)    AST 411 (*)    ALT 427 (*)    Alkaline Phosphatase 241 (*)    Total Bilirubin 2.3 (*)    All other components within normal limits  CBC WITH DIFFERENTIAL/PLATELET - Abnormal; Notable for the following components:   RBC 5.59 (*)    MCV 73.5 (*)    MCH 22.9 (*)    RDW 22.4 (*)    Eosinophils Absolute 0.6 (*)    Abs Immature Granulocytes 0.15 (*)    All other components within normal limits  BASIC METABOLIC PANEL - Abnormal; Notable for the following components:   Calcium 8.6 (*)    All other components within normal limits  CBC - Abnormal; Notable for the following components:   RBC 5.28 (*)    MCV 74.6 (*)    MCH 22.7 (*)    RDW 22.5 (*)    All other components within normal limits  LIPASE, BLOOD  BETA-HYDROXYBUTYRIC ACID  GLUCOSE, CAPILLARY  URINALYSIS, COMPLETE (UACMP) WITH MICROSCOPIC    Lab work reviewed by me with transaminitis and elevated bilirubin concerning for biliary disease __________________________________________  EKG   ____________________________________________  RADIOLOGY  Right upper quadrant ultrasound reviewed by me concerning for choledocholithiasis with the stone visualized in the  CBD ____________________________________________   PROCEDURES  Procedure(s) performed: no  Procedures  Critical Care performed: no  ____________________________________________   INITIAL IMPRESSION / ASSESSMENT AND PLAN / ED COURSE  Pertinent labs & imaging results that were available during my care of the patient were reviewed by me and considered in my medical decision making (see chart for details).   As part of my medical decision making, I reviewed the following data within the electronic MEDICAL RECORD NUMBER History obtained from family if available, nursing notes, old chart and ekg, as well as notes from prior ED visits.  The patient comes to the emergency department with postprandial epigastric and right upper quadrant pain after giving birth.  This is highly concerning for biliary disease so she was given 4 mg of Zofran 4 mg of morphine with significant improvement in her symptoms.  While lab work is pending she is taken off to ultrasound.  The patient's lab work is concerning for biliary disease with her transaminitis.  The ultrasound  shows multiple gallstones and no gallbladder wall thickening and her CBD is normal although they were able to visualize a stone in the duct.  I will reach out to gastroenterology to see if we have ERCP capabilities.     ----------------------------------------- 12:09 AM on 05/15/2018 -----------------------------------------  I spoke with Dr. Allegra Lai who indicated that Dr. Servando Snare is available for ERCP.  I gave the patient 10 mg of IV Toradol with significant improvement her symptoms.  I then spoke with the hospitalist who has graciously agreed to admit the patient to her service. ____________________________________________   FINAL CLINICAL IMPRESSION(S) / ED DIAGNOSES  Final diagnoses:  Right upper quadrant abdominal pain  Choledocholithiasis      NEW MEDICATIONS STARTED DURING THIS VISIT:  Current Discharge Medication List        Note:  This document was prepared using Dragon voice recognition software and may include unintentional dictation errors.     Merrily Brittle, MD 05/15/18 [phone]

## 2018-05-14 NOTE — ED Notes (Signed)
Patient transported to US 

## 2018-05-15 ENCOUNTER — Observation Stay: Payer: BLUE CROSS/BLUE SHIELD

## 2018-05-15 ENCOUNTER — Other Ambulatory Visit: Payer: Self-pay

## 2018-05-15 DIAGNOSIS — K8050 Calculus of bile duct without cholangitis or cholecystitis without obstruction: Secondary | ICD-10-CM | POA: Diagnosis present

## 2018-05-15 LAB — BASIC METABOLIC PANEL
Anion gap: 10 (ref 5–15)
BUN: 13 mg/dL (ref 6–20)
CALCIUM: 8.6 mg/dL — AB (ref 8.9–10.3)
CHLORIDE: 106 mmol/L (ref 98–111)
CO2: 23 mmol/L (ref 22–32)
Creatinine, Ser: 0.56 mg/dL (ref 0.44–1.00)
GFR calc non Af Amer: >60 mL/min (ref >60)
Glucose, Bld: 91 mg/dL (ref 70–99)
Potassium: 3.6 mmol/L (ref 3.5–5.1)
SODIUM: 139 mmol/L (ref 135–145)

## 2018-05-15 LAB — CBC WITH DIFFERENTIAL/PLATELET
Abs Immature Granulocytes: 0.15 10*3/uL — ABNORMAL HIGH (ref 0.00–0.07)
BASOS ABS: 0 10*3/uL (ref 0.0–0.1)
Basophils Relative: 0 %
EOS ABS: 0.6 10*3/uL — AB (ref 0.0–0.5)
Eosinophils Relative: 7 %
HEMATOCRIT: 41.1 % (ref 36.0–46.0)
Hemoglobin: 12.8 g/dL (ref 12.0–15.0)
IMMATURE GRANULOCYTES: 2 %
LYMPHS ABS: 2.2 10*3/uL (ref 0.7–4.0)
LYMPHS PCT: 25 %
MCH: 22.9 pg — AB (ref 26.0–34.0)
MCHC: 31.1 g/dL (ref 30.0–36.0)
MCV: 73.5 fL — AB (ref 80.0–100.0)
MONOS PCT: 4 %
Monocytes Absolute: 0.4 10*3/uL (ref 0.1–1.0)
NEUTROS PCT: 62 %
Neutro Abs: 5.4 10*3/uL (ref 1.7–7.7)
Platelets: 313 10*3/uL (ref 150–400)
RBC: 5.59 MIL/uL — ABNORMAL HIGH (ref 3.87–5.11)
RDW: 22.4 % — AB (ref 11.5–15.5)
WBC: 8.4 10*3/uL (ref 4.0–10.5)
nRBC: 0 % (ref 0.0–0.2)

## 2018-05-15 LAB — CBC
HCT: 39.4 % (ref 36.0–46.0)
HEMOGLOBIN: 12 g/dL (ref 12.0–15.0)
MCH: 22.7 pg — AB (ref 26.0–34.0)
MCHC: 30.5 g/dL (ref 30.0–36.0)
MCV: 74.6 fL — ABNORMAL LOW (ref 80.0–100.0)
Platelets: 286 10*3/uL (ref 150–400)
RBC: 5.28 MIL/uL — ABNORMAL HIGH (ref 3.87–5.11)
RDW: 22.5 % — ABNORMAL HIGH (ref 11.5–15.5)
WBC: 6 10*3/uL (ref 4.0–10.5)
nRBC: 0 % (ref 0.0–0.2)

## 2018-05-15 LAB — URINALYSIS, COMPLETE (UACMP) WITH MICROSCOPIC
GLUCOSE, UA: NEGATIVE mg/dL
Hgb urine dipstick: NEGATIVE
KETONES UR: NEGATIVE mg/dL
Nitrite: NEGATIVE
PROTEIN: NEGATIVE mg/dL
Specific Gravity, Urine: 1.021 (ref 1.005–1.030)
pH: 6 (ref 5.0–8.0)

## 2018-05-15 LAB — GLUCOSE, CAPILLARY: Glucose-Capillary: 84 mg/dL (ref 70–99)

## 2018-05-15 MED ORDER — PRENATAL PLUS 27-1 MG PO TABS
1.0000 | ORAL_TABLET | Freq: Every day | ORAL | Status: DC
  Filled 2018-05-15 (×5): qty 1

## 2018-05-15 MED ORDER — ONDANSETRON HCL 4 MG/2ML IJ SOLN
4.0000 mg | Freq: Four times a day (QID) | INTRAMUSCULAR | Status: DC | PRN
  Administered 2018-05-16 – 2018-05-17 (×2): 4 mg via INTRAVENOUS
  Filled 2018-05-15: qty 2

## 2018-05-15 MED ORDER — DOCUSATE SODIUM 100 MG PO CAPS
100.0000 mg | ORAL_CAPSULE | Freq: Two times a day (BID) | ORAL | Status: DC
  Administered 2018-05-15 – 2018-05-18 (×3): 100 mg via ORAL
  Filled 2018-05-15 (×6): qty 1

## 2018-05-15 MED ORDER — GADOBUTROL 1 MMOL/ML IV SOLN
6.5000 mL | Freq: Once | INTRAVENOUS | Status: AC | PRN
  Administered 2018-05-15: 6.5 mL via INTRAVENOUS

## 2018-05-15 MED ORDER — BISACODYL 5 MG PO TBEC: 5.0000 mg | DELAYED_RELEASE_TABLET | Freq: Every day | ORAL | Status: DC | PRN

## 2018-05-15 MED ORDER — ACETAMINOPHEN 325 MG PO TABS: 650.0000 mg | ORAL_TABLET | Freq: Four times a day (QID) | ORAL | Status: DC | PRN

## 2018-05-15 MED ORDER — HYDROCODONE-ACETAMINOPHEN 5-325 MG PO TABS
1.0000 | ORAL_TABLET | ORAL | Status: DC | PRN
  Administered 2018-05-15: 2 via ORAL
  Filled 2018-05-15 (×2): qty 2

## 2018-05-15 MED ORDER — HYDROMORPHONE HCL 1 MG/ML IJ SOLN
0.5000 mg | INTRAMUSCULAR | Status: DC | PRN
  Administered 2018-05-15 – 2018-05-18 (×6): 0.5 mg via INTRAVENOUS
  Filled 2018-05-15 (×6): qty 0.5

## 2018-05-15 MED ORDER — TRAZODONE HCL 50 MG PO TABS: 25.0000 mg | ORAL_TABLET | Freq: Every evening | ORAL | Status: DC | PRN

## 2018-05-15 MED ORDER — SODIUM CHLORIDE 0.9 % IV SOLN
Freq: Once | INTRAVENOUS | Status: AC
  Administered 2018-05-15: 08:00:00 via INTRAVENOUS

## 2018-05-15 MED ORDER — ENOXAPARIN SODIUM 40 MG/0.4ML ~~LOC~~ SOLN
40.0000 mg | SUBCUTANEOUS | Status: DC
  Administered 2018-05-15 – 2018-05-18 (×3): 40 mg via SUBCUTANEOUS
  Filled 2018-05-15 (×4): qty 0.4

## 2018-05-15 MED ORDER — SODIUM CHLORIDE 0.9 % IV SOLN
INTRAVENOUS | Status: DC
  Administered 2018-05-15 – 2018-05-18 (×5): via INTRAVENOUS

## 2018-05-15 MED ORDER — ACETAMINOPHEN 650 MG RE SUPP: 650.0000 mg | Freq: Four times a day (QID) | RECTAL | Status: DC | PRN

## 2018-05-15 NOTE — ED Notes (Signed)
Pt transported to room 214 

## 2018-05-15 NOTE — H&P (Addendum)
[place] [facility] [place] NAME: Gina Lester    MR#:  [id number]  DATE OF BIRTH:  05/13/1996  DATE OF ADMISSION:  05/14/2018  PRIMARY CARE PHYSICIAN: Patient, No Pcp Per   REQUESTING/REFERRING PHYSICIAN:   CHIEF COMPLAINT:   Chief Complaint  Patient presents with  . Abdominal Pain    HISTORY OF PRESENT ILLNESS: Gina Lester  is a 22 y.o. female without significant past medical history.She is 5 weeks postpartum. Patient presented to emergency room for severe upper right quadrant pain, nausea and vomiting, going on for the past 3 to 4 weeks, gradually getting worse.   Test done emergency room are notable for elevated bilirubin level at 2.3, AST 4011, ALT 427, alk phos 241. Abdominal ultrasound shows cholelithiasis with mild gallbladder distention. No gallbladder wall thickening or pericholecystic fluid. Choledocholithiasis with a 4 mm stone in the distal common bile duct. Despite choledocholithiasis, no significant ductal dilatation, common duct measures 3 mm at the porta hepatis and 5 mm distally. Patient is admitted for further evaluation and treatment.  PAST MEDICAL HISTORY:   Past Medical History:  Diagnosis Date  . Constipation   . Seasonal allergies     PAST SURGICAL HISTORY:  Past Surgical History:  Procedure Laterality Date  . NO PAST SURGERIES      SOCIAL HISTORY:  Social History   Tobacco Use  . Smoking status: Never Smoker  . Smokeless tobacco: Never Used  Substance Use Topics  . Alcohol use: Not Currently    FAMILY HISTORY:  Family History  Problem Relation Age of Onset  . Gallbladder disease Mother     DRUG ALLERGIES: No Known Allergies  REVIEW OF SYSTEMS:   CONSTITUTIONAL: No fever, fatigue or weakness.  EYES: No changes in vision.  EARS, NOSE, AND THROAT: No tinnitus or ear pain.  RESPIRATORY: No cough, shortness of breath, wheezing or hemoptysis.  CARDIOVASCULAR: No chest pain,  orthopnea, edema.  GASTROINTESTINAL: [place] for nausea, vomiting,  abdominal pain.  GENITOURINARY: No dysuria, hematuria.  ENDOCRINE: No polyuria, nocturia. HEMATOLOGY: No bleeding. SKIN: No rash or lesion. MUSCULOSKELETAL: No joint pain at this time.   NEUROLOGIC: No focal weakness.  PSYCHIATRY: No anxiety or depression.   MEDICATIONS AT HOME:  Prior to Admission medications   Medication Sig Start Date End Date Taking? Authorizing Provider  Prenatal Vit-Fe Fumarate-FA (PRENATAL MULTIVITAMIN) TABS tablet Take 1 tablet by mouth daily at 12 noon.   Yes [provider]  ibuprofen (ADVIL,MOTRIN) 600 MG tablet Take 1 tablet (600 mg total) by mouth every 6 (six) hours. Patient not taking: Reported on 05/15/2018 04/09/18   Philip Aspen, CNM      PHYSICAL EXAMINATION:   VITAL SIGNS: Blood pressure 119/75, pulse 81, temperature 98.1 F (36.7 C), temperature source Oral, resp. rate 14, height 5' (1.524 m), weight 70 kg, SpO2 100 %, currently breastfeeding.  GENERAL:  22 y.o.-year-old patient lying in the bed with moderate distress, can [place] to pain and nausea.  EYES: Pupils equal, round, reactive to light and accommodation.  Bilateral scleral icterus noted. Extraocular muscles intact.  HEENT: Head atraumatic, normocephalic. Oropharynx and nasopharynx clear.  NECK:  Supple, no jugular venous distention. No thyroid enlargement, no tenderness.  LUNGS: Normal breath sounds bilaterally, no wheezing, rales,rhonchi or crepitation. No use of accessory muscles of respiration.  CARDIOVASCULAR: S1, S2 normal. No S3/S4.  ABDOMEN: There is tenderness with palpation at right upper quadrant, no rebound.  Otherwise, the abdomen is soft,  nondistended. Bowel sounds present. No organomegaly or mass.  EXTREMITIES: No pedal edema, cyanosis, or clubbing.  NEUROLOGIC: Cranial nerves II through XII are intact. Muscle strength 5/5 in all extremities. Sensation intact.   PSYCHIATRIC: The patient is alert and  oriented x 3.  SKIN: No obvious rash, lesion, or ulcer.   LABORATORY PANEL:   CBC Recent Labs  Lab 05/09/18 0154 05/14/18 2301  WBC 14.2* 8.4  HGB 12.0 12.8  HCT 38.5 41.1  PLT 305 313  MCV 72.9* 73.5*  MCH 22.7* 22.9*  MCHC 31.2 31.1  RDW 22.3* 22.4*  LYMPHSABS  --  2.2  MONOABS  --  0.4  EOSABS  --  0.6*  BASOSABS  --  0.0   ------------------------------------------------------------------------------------------------------------------  Chemistries  Recent Labs  Lab 05/09/18 0154 05/14/18 2301  NA 139 136  K 3.7 3.7  CL 106 106  CO2 22 23  GLUCOSE 113* 107*  BUN 12 13  CREATININE 0.58 0.58  CALCIUM 9.1 9.1  AST 84* 411*  ALT 61* 427*  ALKPHOS 145* 241*  BILITOT 0.5 2.3*   ------------------------------------------------------------------------------------------------------------------ estimated creatinine clearance is 97.9 mL/min (by C-G formula based on SCr of 0.58 mg/dL). ------------------------------------------------------------------------------------------------------------------ No results for input(s): TSH, T4TOTAL, T3FREE, THYROIDAB in the last 72 hours.  Invalid input(s): FREET3   Coagulation profile No results for input(s): INR, PROTIME in the last 168 hours. ------------------------------------------------------------------------------------------------------------------- No results for input(s): DDIMER in the last 72 hours. -------------------------------------------------------------------------------------------------------------------  Cardiac Enzymes No results for input(s): CKMB, TROPONINI, MYOGLOBIN in the last 168 hours.  Invalid input(s): CK ------------------------------------------------------------------------------------------------------------------ Invalid input(s): POCBNP  ---------------------------------------------------------------------------------------------------------------  Urinalysis    Component Value  Date/Time   COLORURINE YELLOW (A) 05/09/2018 0154   APPEARANCEUR HAZY (A) 05/09/2018 0154   APPEARANCEUR Clear 09/05/2017 1053   LABSPEC 1.019 05/09/2018 0154   LABSPEC 1.018 07/12/2014 2235   PHURINE 7.0 05/09/2018 0154   GLUCOSEU NEGATIVE 05/09/2018 0154   GLUCOSEU Negative 07/12/2014 2235   HGBUR MODERATE (A) 05/09/2018 0154   BILIRUBINUR NEGATIVE 05/09/2018 0154   BILIRUBINUR 1+ 04/07/2018 1345   BILIRUBINUR Negative 09/05/2017 1053   BILIRUBINUR Negative 07/12/2014 2235   KETONESUR NEGATIVE 05/09/2018 0154   PROTEINUR NEGATIVE 05/09/2018 0154   UROBILINOGEN 0.2 04/07/2018 1345   UROBILINOGEN 0.2 04/19/2014 1200   NITRITE NEGATIVE 05/09/2018 0154   LEUKOCYTESUR LARGE (A) 05/09/2018 0154   LEUKOCYTESUR Negative 09/05/2017 1053   LEUKOCYTESUR Trace 07/12/2014 2235     RADIOLOGY: US Abdomen Limited Ruq  Result Date: 05/14/2018 CLINICAL DATA:  Right upper quadrant pain. EXAM: ULTRASOUND ABDOMEN LIMITED RIGHT UPPER QUADRANT COMPARISON:  Right upper quadrant ultrasound on 05/16/2017 FINDINGS: Gallbladder: Mild gallbladder distention with multiple gallstones. No gallbladder wall thickening, wall thickness is 2 mm. No pericholecystic fluid. No sonographic Murphy sign noted by sonographer. Common bile duct: Diameter: 3 mm at the porta hepatis, 5 mm distally. There is a 4 mm stone in the distal common bile duct. Liver: No focal lesion identified. Within normal limits in parenchymal echogenicity. Portal vein is patent on color Doppler imaging with normal direction of blood flow towards the liver. IMPRESSION: 1. Cholelithiasis with mild gallbladder distention. No gallbladder wall thickening or pericholecystic fluid. 2. Choledocholithiasis with a 4 mm stone in the distal common bile duct. Despite choledocholithiasis, no significant ductal dilatation, common duct measures 3 mm at the porta hepatis and 5 mm distally. Electronically Signed   By: Keith Rake M.D.   On: 05/14/2018 23:59     EKG: Orders placed or performed during the hospital  encounter of 02/02/17  . ED EKG  . ED EKG  . EKG    IMPRESSION AND PLAN:  1.  Acute choledocholithiasis.  We will keep patient n.p.o., continue antinausea and pain medications.  We will schedule patient for ERCP. Gastroenterology is consulted for further evaluation and treatment.  All the records are reviewed and case discussed with ED provider. Management plans discussed with the patient, family and they are in agreement.  CODE STATUS: Full Code Status History    Date Active Date Inactive Code Status Order ID Comments User Context   04/08/2018 1246 04/09/2018 1714 Full Code [id number]  Philip Aspen, [place] Inpatient   04/08/2018 0743 04/08/2018 1246 Full Code [id number]  Philip Aspen, [place] Inpatient   12/11/2015 2302 12/13/2015 1506 Full Code [id number]  Joylene Igo, CNM Inpatient   12/10/2015 2211 12/11/2015 2302 Full Code [id number]  Joylene Igo, CNM Inpatient   10/16/2015 0544 10/16/2015 1134 Full Code [id number]  Minda Meo, RN Inpatient   09/07/2015 1827 09/08/2015 1231 Full Code [id number]  Melvenia Beam, RN Inpatient   04/17/2014 0200 04/20/2014 1724 Full Code [id number]  Munns, Bethanne Ginger, MD Inpatient       TOTAL TIME TAKING CARE OF THIS PATIENT: 45 minutes.    Amelia Jo M.D on 05/15/2018 at 2:06 AM  Between 7am to 6pm - Pager - [phone]  After 6pm go to [email or web address] - password [email or web address] [facility] [place] [facility]  [phone]  CC: Primary care physician; Patient, No Pcp Per

## 2018-05-15 NOTE — Progress Notes (Signed)
Patient briefly seen and examined.  Patient admitted this morning for choledocholithiasis.  MRCP ordered by myself and GI consultation ordered by ER physician.

## 2018-05-16 ENCOUNTER — Encounter: Payer: Self-pay | Admitting: *Deleted

## 2018-05-16 ENCOUNTER — Inpatient Hospital Stay: Payer: BLUE CROSS/BLUE SHIELD | Admitting: Anesthesiology

## 2018-05-16 ENCOUNTER — Inpatient Hospital Stay: Payer: BLUE CROSS/BLUE SHIELD

## 2018-05-16 ENCOUNTER — Encounter
Admission: EM | Disposition: A | Discharge status: Home / Self Care | Payer: Self-pay | Source: Home / Self Care | Attending: Internal Medicine

## 2018-05-16 DIAGNOSIS — G8918 Other acute postprocedural pain: Secondary | ICD-10-CM | POA: Diagnosis not present

## 2018-05-16 DIAGNOSIS — Z79899 Other long term (current) drug therapy: Secondary | ICD-10-CM | POA: Diagnosis not present

## 2018-05-16 DIAGNOSIS — K8050 Calculus of bile duct without cholangitis or cholecystitis without obstruction: Secondary | ICD-10-CM | POA: Diagnosis not present

## 2018-05-16 DIAGNOSIS — Z91048 Other nonmedicinal substance allergy status: Secondary | ICD-10-CM | POA: Diagnosis not present

## 2018-05-16 DIAGNOSIS — R945 Abnormal results of liver function studies: Secondary | ICD-10-CM

## 2018-05-16 DIAGNOSIS — R1011 Right upper quadrant pain: Secondary | ICD-10-CM | POA: Diagnosis present

## 2018-05-16 DIAGNOSIS — R1033 Periumbilical pain: Secondary | ICD-10-CM | POA: Diagnosis present

## 2018-05-16 DIAGNOSIS — R7989 Other specified abnormal findings of blood chemistry: Secondary | ICD-10-CM

## 2018-05-16 DIAGNOSIS — K8070 Calculus of gallbladder and bile duct without cholecystitis without obstruction: Secondary | ICD-10-CM | POA: Diagnosis present

## 2018-05-16 HISTORY — PX: ENDOSCOPIC RETROGRADE CHOLANGIOPANCREATOGRAPHY (ERCP) WITH PROPOFOL: SHX5810

## 2018-05-16 LAB — GLUCOSE, CAPILLARY
GLUCOSE-CAPILLARY: 175 mg/dL — AB (ref 70–99)
GLUCOSE-CAPILLARY: 62 mg/dL — AB (ref 70–99)

## 2018-05-16 LAB — COMPREHENSIVE METABOLIC PANEL
ALBUMIN: 3.5 g/dL (ref 3.5–5.0)
ALK PHOS: 241 U/L — AB (ref 38–126)
ALT: 317 U/L — AB (ref 0–44)
AST: 170 U/L — ABNORMAL HIGH (ref 15–41)
Anion gap: 6 (ref 5–15)
BUN: 11 mg/dL (ref 6–20)
CALCIUM: 8.6 mg/dL — AB (ref 8.9–10.3)
CO2: 25 mmol/L (ref 22–32)
CREATININE: 0.48 mg/dL (ref 0.44–1.00)
Chloride: 107 mmol/L (ref 98–111)
GFR calc Af Amer: >60 mL/min (ref >60)
GFR calc non Af Amer: >60 mL/min (ref >60)
GLUCOSE: 80 mg/dL (ref 70–99)
Potassium: 3.8 mmol/L (ref 3.5–5.1)
SODIUM: 138 mmol/L (ref 135–145)
TOTAL PROTEIN: 7 g/dL (ref 6.5–8.1)
Total Bilirubin: 1.5 mg/dL — ABNORMAL HIGH (ref 0.3–1.2)

## 2018-05-16 SURGERY — ENDOSCOPIC RETROGRADE CHOLANGIOPANCREATOGRAPHY (ERCP) WITH PROPOFOL
Anesthesia: General

## 2018-05-16 MED ORDER — MORPHINE SULFATE (PF) 2 MG/ML IV SOLN: 1.0000 mg | INTRAVENOUS | Status: DC | PRN

## 2018-05-16 MED ORDER — LIDOCAINE HCL (CARDIAC) PF 100 MG/5ML IV SOSY
PREFILLED_SYRINGE | INTRAVENOUS | Status: DC | PRN
  Administered 2018-05-16: 50 mg via INTRAVENOUS

## 2018-05-16 MED ORDER — DEXTROSE 50 % IV SOLN
25.0000 mL | INTRAVENOUS | Status: AC
  Administered 2018-05-16: 25 mL via INTRAVENOUS
  Filled 2018-05-16: qty 50

## 2018-05-16 MED ORDER — INDOMETHACIN 50 MG RE SUPP
100.0000 mg | Freq: Once | RECTAL | Status: AC
  Administered 2018-05-16: 100 mg via RECTAL

## 2018-05-16 MED ORDER — ONDANSETRON HCL 4 MG/2ML IJ SOLN
INTRAMUSCULAR | Status: DC | PRN
  Administered 2018-05-16: 4 mg via INTRAVENOUS

## 2018-05-16 MED ORDER — MIDAZOLAM HCL 2 MG/2ML IJ SOLN
INTRAMUSCULAR | Status: AC
  Filled 2018-05-16: qty 2

## 2018-05-16 MED ORDER — LACTATED RINGERS IV SOLN
INTRAVENOUS | Status: DC | PRN
  Administered 2018-05-16: 14:00:00 via INTRAVENOUS

## 2018-05-16 MED ORDER — FENTANYL CITRATE (PF) 100 MCG/2ML IJ SOLN
INTRAMUSCULAR | Status: AC
  Filled 2018-05-16: qty 2

## 2018-05-16 MED ORDER — PROMETHAZINE HCL 25 MG/ML IJ SOLN
INTRAMUSCULAR | Status: AC
  Administered 2018-05-16: 6.25 mg via INTRAVENOUS
  Filled 2018-05-16: qty 1

## 2018-05-16 MED ORDER — FENTANYL CITRATE (PF) 100 MCG/2ML IJ SOLN
INTRAMUSCULAR | Status: DC | PRN
  Administered 2018-05-16: 100 ug via INTRAVENOUS

## 2018-05-16 MED ORDER — SUCCINYLCHOLINE CHLORIDE 20 MG/ML IJ SOLN
INTRAMUSCULAR | Status: DC | PRN
  Administered 2018-05-16: 120 mg via INTRAVENOUS

## 2018-05-16 MED ORDER — DEXTROSE 5 % IV SOLN
INTRAVENOUS | Status: DC
  Administered 2018-05-16 – 2018-05-17 (×2): via INTRAVENOUS

## 2018-05-16 MED ORDER — SODIUM CHLORIDE FLUSH 0.9 % IV SOLN
INTRAVENOUS | Status: AC
  Filled 2018-05-16: qty 10

## 2018-05-16 MED ORDER — MIDAZOLAM HCL 2 MG/2ML IJ SOLN
INTRAMUSCULAR | Status: DC | PRN
  Administered 2018-05-16: 2 mg via INTRAVENOUS

## 2018-05-16 MED ORDER — SODIUM CHLORIDE 0.9 % IV SOLN
INTRAVENOUS | Status: DC
  Administered 2018-05-16: 13:00:00 via INTRAVENOUS

## 2018-05-16 MED ORDER — PROPOFOL 10 MG/ML IV BOLUS
INTRAVENOUS | Status: DC | PRN
  Administered 2018-05-16: 150 mg via INTRAVENOUS

## 2018-05-16 MED ORDER — ONDANSETRON HCL 4 MG/2ML IJ SOLN: 4.0000 mg | Freq: Once | INTRAMUSCULAR | Status: DC | PRN

## 2018-05-16 MED ORDER — PHENYLEPHRINE HCL 10 MG/ML IJ SOLN
INTRAMUSCULAR | Status: DC | PRN
  Administered 2018-05-16: 100 ug via INTRAVENOUS

## 2018-05-16 MED ORDER — FENTANYL CITRATE (PF) 100 MCG/2ML IJ SOLN: 25.0000 ug | INTRAMUSCULAR | Status: DC | PRN

## 2018-05-16 MED ORDER — PROMETHAZINE HCL 25 MG/ML IJ SOLN
6.2500 mg | Freq: Once | INTRAMUSCULAR | Status: AC
  Administered 2018-05-16: 6.25 mg via INTRAVENOUS

## 2018-05-16 MED ORDER — INDOMETHACIN 50 MG RE SUPP
RECTAL | Status: AC
  Administered 2018-05-16: 100 mg via RECTAL
  Filled 2018-05-16: qty 2

## 2018-05-16 NOTE — Progress Notes (Signed)
Responded to rapid response.  Patient currently on room air saturations are 98-100%,  Informed nursing supervisor that I was leaving due to respiratory therapist not needed at this time.

## 2018-05-16 NOTE — Anesthesia Preprocedure Evaluation (Addendum)
Anesthesia Evaluation  Patient identified by MRN, date of birth, ID band Patient awake    Reviewed: Allergy & Precautions, NPO status , Patient's Chart, lab work & pertinent test results, reviewed documented beta blocker date and time   Airway Mallampati: III  TM Distance: >3 FB     Dental  (+) Chipped   Pulmonary           Cardiovascular      Neuro/Psych    GI/Hepatic   Endo/Other    Renal/GU      Musculoskeletal   Abdominal   Peds  Hematology   Anesthesia Other Findings EKG noted - poor R's, otherwise ok. No cardiac symptoms. She swears she is not pregnant. Husband agrees. No sex after her baby's birth 4 weeks ago. Will proceed, as she is hurting quite a bit and cannot give urine.  Reproductive/Obstetrics                            Anesthesia Physical Anesthesia Plan  ASA: II  Anesthesia Plan: General   Post-op Pain Management:    Induction: Intravenous  PONV Risk Score and Plan:   Airway Management Planned: Oral ETT  Additional Equipment:   Intra-op Plan:   Post-operative Plan:   Informed Consent: I have reviewed the patients History and Physical, chart, labs and discussed the procedure including the risks, benefits and alternatives for the proposed anesthesia with the patient or authorized representative who has indicated his/her understanding and acceptance.     Plan Discussed with: CRNA  Anesthesia Plan Comments:         Anesthesia Quick Evaluation

## 2018-05-16 NOTE — Anesthesia Post-op Follow-up Note (Signed)
Anesthesia QCDR form completed.        

## 2018-05-16 NOTE — Consult Note (Signed)
Wyline Mood , MD [address], [place], [place], [address] [address], [address], [place], [place], [address] Phone: [phone]  Fax: [phone]  Consultation  Referring Provider:  Dr Juliene Pina  Primary Care Physician:  Patient, No Pcp Per Primary Gastroenterologist: None          Reason for Consultation:     CBD stone   Date of Admission:  05/14/2018 Date of Consultation:  05/16/2018         HPI:   Gina Lester is a 22 y.o. female who is 5 weeks postpartum presented to the emergency room with right upper quadrant pain.  He states that for the past 4 weeks she has been having epigastric and right upper quadrant pain.  Not really related to meals.  On and off.  No aggravating or relieving factors.  No fevers.  Presently no pain.  It got very severe yesterday and hence decided to come into the hospital.  He does mention that her mother also had issues with the gallbladder.  Denies use of any hormonal contraceptives.   In the emergency room she he was noted to have an AST of 170, ALT of 317, alkaline phosphatase of 241, total bilirubin of 1.5.I do note that 2 days back as well she had elevated liver function test.  MRCP performed in the emergency room showed cholelithiasis in addition to 3 mm distal CBD stone.  Right upper quadrant ultrasound performed on 05/14/2018 demonstrated cholelithiasis with gallbladder distention.  Choledocholithiasis with a 4 mm stone in the distal common bile duct.  Appears there was a GI consultation order placed by the emergency room physician but I did not receive any information about the admission.  Past Medical History:  Diagnosis Date  . Constipation   . Seasonal allergies     Past Surgical History:  Procedure Laterality Date  . NO PAST SURGERIES      Prior to Admission medications   Medication Sig Start Date End Date Taking? Authorizing Provider  Prenatal Vit-Fe Fumarate-FA (PRENATAL MULTIVITAMIN) TABS tablet Take 1 tablet by  mouth daily at 12 noon.   Yes [provider]  ibuprofen (ADVIL,MOTRIN) 600 MG tablet Take 1 tablet (600 mg total) by mouth every 6 (six) hours. Patient not taking: Reported on 05/15/2018 04/09/18   Doreene Burke, CNM    Family History  Problem Relation Age of Onset  . Gallbladder disease Mother      Social History   Tobacco Use  . Smoking status: Never Smoker  . Smokeless tobacco: Never Used  Substance Use Topics  . Alcohol use: Not Currently  . Drug use: No    Allergies as of 05/14/2018  . (No Known Allergies)    Review of Systems:    All systems reviewed and negative except where noted in HPI.   Physical Exam:  Vital signs in last 24 hours: Temp:  [98.1 F (36.7 C)-98.2 F (36.8 C)] 98.1 F (36.7 C) (10/25 0431) Pulse Rate:  [57-97] 97 (10/25 0824) Resp:  [16] 16 (10/25 0431) BP: (95-113)/(65-80) 113/80 (10/25 0824) SpO2:  [88 %-99 %] 88 % (10/25 0824) Weight:  [68.2 kg] 68.2 kg (10/25 0430) Last BM Date: 05/15/18 General:   Pleasant, cooperative in NAD Head:  Normocephalic and atraumatic. Eyes:   No icterus.   Conjunctiva pink. PERRLA. Ears:  Normal auditory acuity. Neck:  Supple; no masses or thyroidomegaly Lungs: Respirations even and unlabored. Lungs clear to auscultation bilaterally.   No wheezes, crackles, or  rhonchi.  Heart:  Regular rate and rhythm;  Without murmur, clicks, rubs or gallops Abdomen:  Soft, nondistended,, mild right upper quadrant tenderness.  l sounds. No appreciable masses or hepatomegaly.  No rebound or guarding.  Neurologic:  Alert and oriented x3;  grossly normal neurologically. Skin:  Intact without significant lesions or rashes. Cervical Nodes:  No significant cervical adenopathy. Psych:  Alert and cooperative. Normal affect.  LAB RESULTS: Recent Labs    05/14/18 2301 05/15/18 0819  WBC 8.4 6.0  HGB 12.8 12.0  HCT 41.1 39.4  PLT 313 286   BMET Recent Labs    05/14/18 2301 05/15/18 0819 05/16/18 0540  NA 136  139 138  K 3.7 3.6 3.8  CL 106 106 107  CO2 23 23 25   GLUCOSE 107* 91 80  BUN 13 13 11   CREATININE 0.58 0.56 0.48  CALCIUM 9.1 8.6* 8.6*   LFT Recent Labs    05/16/18 0540  PROT 7.0  ALBUMIN 3.5  AST 170*  ALT 317*  ALKPHOS 241*  BILITOT 1.5*   PT/INR No results for input(s): LABPROT, INR in the last 72 hours.  STUDIES: Mr 3d Recon At Scanner  Result Date: 05/16/2018 CLINICAL DATA:  Right upper quadrant abdominal pain with nausea and vomiting. Five weeks postpartum. Cholelithiasis and choledocholithiasis. EXAM: MRI ABDOMEN WITHOUT AND WITH CONTRAST (INCLUDING MRCP) TECHNIQUE: Multiplanar multisequence MR imaging of the abdomen was performed both before and after the administration of intravenous contrast. Heavily T2-weighted images of the biliary and pancreatic ducts were obtained, and three-dimensional MRCP images were rendered by post processing. CONTRAST:  6.5 cc Gadavist COMPARISON:  Ultrasound from 05/14/2018 FINDINGS: Despite efforts by the technologist and patient, motion artifact is present on today's exam and could not be eliminated. This reduces exam sensitivity and specificity. Lower chest: Unremarkable Hepatobiliary: Multiple small gallstones in the 3 mm diameter range as shown on images 17-18 of series 3. Due to patient motion, the distal most CBD was excluded on the dedicated MRCP images, but there is a suggestion of a up to 3 mm in diameter distal CBD stone on image 14/3. No biliary dilatation. No significant abnormal enhancement along the biliary tree. No significant focal hepatic lesion or gallbladder wall thickening. No pericholecystic fluid. Pancreas:  Unremarkable Spleen:  Unremarkable Adrenals/Urinary Tract:  Unremarkable Stomach/Bowel: Unremarkable Vascular/Lymphatic:  Unremarkable Other:  No supplemental non-categorized findings. Musculoskeletal: Unremarkable IMPRESSION: 1. Cholelithiasis in addition to a suspected 3 mm distal CBD stone compatible with  choledocholithiasis. No current biliary dilatation or abnormal enhancement along the biliary tree. No gallbladder wall thickening or pericholecystic fluid. No other significant abnormalities identified. Electronically Signed   By: Gaylyn Rong M.D.   On: 05/16/2018 07:10   Mr Abdomen Mrcp Vivien Rossetti Contast  Result Date: 05/16/2018 CLINICAL DATA:  Right upper quadrant abdominal pain with nausea and vomiting. Five weeks postpartum. Cholelithiasis and choledocholithiasis. EXAM: MRI ABDOMEN WITHOUT AND WITH CONTRAST (INCLUDING MRCP) TECHNIQUE: Multiplanar multisequence MR imaging of the abdomen was performed both before and after the administration of intravenous contrast. Heavily T2-weighted images of the biliary and pancreatic ducts were obtained, and three-dimensional MRCP images were rendered by post processing. CONTRAST:  6.5 cc Gadavist COMPARISON:  Ultrasound from 05/14/2018 FINDINGS: Despite efforts by the technologist and patient, motion artifact is present on today's exam and could not be eliminated. This reduces exam sensitivity and specificity. Lower chest: Unremarkable Hepatobiliary: Multiple small gallstones in the 3 mm diameter range as shown on images 17-18 of series 3. Due to  patient motion, the distal most CBD was excluded on the dedicated MRCP images, but there is a suggestion of a up to 3 mm in diameter distal CBD stone on image 14/3. No biliary dilatation. No significant abnormal enhancement along the biliary tree. No significant focal hepatic lesion or gallbladder wall thickening. No pericholecystic fluid. Pancreas:  Unremarkable Spleen:  Unremarkable Adrenals/Urinary Tract:  Unremarkable Stomach/Bowel: Unremarkable Vascular/Lymphatic:  Unremarkable Other:  No supplemental non-categorized findings. Musculoskeletal: Unremarkable IMPRESSION: 1. Cholelithiasis in addition to a suspected 3 mm distal CBD stone compatible with choledocholithiasis. No current biliary dilatation or abnormal  enhancement along the biliary tree. No gallbladder wall thickening or pericholecystic fluid. No other significant abnormalities identified. Electronically Signed   By: Gaylyn Rong M.D.   On: 05/16/2018 07:10   US Abdomen Limited Ruq  Result Date: 05/14/2018 CLINICAL DATA:  Right upper quadrant pain. EXAM: ULTRASOUND ABDOMEN LIMITED RIGHT UPPER QUADRANT COMPARISON:  Right upper quadrant ultrasound on 05/16/2017 FINDINGS: Gallbladder: Mild gallbladder distention with multiple gallstones. No gallbladder wall thickening, wall thickness is 2 mm. No pericholecystic fluid. No sonographic Murphy sign noted by sonographer. Common bile duct: Diameter: 3 mm at the porta hepatis, 5 mm distally. There is a 4 mm stone in the distal common bile duct. Liver: No focal lesion identified. Within normal limits in parenchymal echogenicity. Portal vein is patent on color Doppler imaging with normal direction of blood flow towards the liver. IMPRESSION: 1. Cholelithiasis with mild gallbladder distention. No gallbladder wall thickening or pericholecystic fluid. 2. Choledocholithiasis with a 4 mm stone in the distal common bile duct. Despite choledocholithiasis, no significant ductal dilatation, common duct measures 3 mm at the porta hepatis and 5 mm distally. Electronically Signed   By: Narda Rutherford M.D.   On: 05/14/2018 23:59      Impression / Plan:   Rylynn Kobs Shon Lester is a 22 y.o. y/o female who is 5 weeks postpartum.  Presents with abdominal pain abnormal liver function tests, choledocholithiasis on MRCP.  Plan 1.  ERCP today. 2.  After ERCP she would likely need a cholecystectomy please consult surgery to decide timing of surgery if they are going to do it before discharge or as an outpatient.  I have discussed alternative options, risks & benefits,  which include, but are not limited to, bleeding, infection, perforation, acute pancreatitis, death,respiratory complication & drug reaction.  The patient  agrees with this plan & written consent will be obtained.     Thank you for involving me in the care of this patient.      LOS: 1 day   Wyline Mood, MD  05/16/2018, 9:11 AM

## 2018-05-16 NOTE — Consult Note (Signed)
SURGICAL CONSULTATION NOTE (initial)   HISTORY OF PRESENT ILLNESS (HPI):  22 y.o. female presented to [facility] ED on 10/23 for evaluation of abdominal pain. Patient reports she has had the sudden onset of sharp severe RUQ/Epigastric abdominal pain with associated nausea and emesis. She reports that she has had multiple episodes of similar pain in the last 5 weeks. Each of these episodes has been associated with eating. She denies any fever, chills, CP, SOB, bowel changes, urinary changes, or peripheral edema. Work up in the ED revealed hyperbilirubinemia and cholelithiasis on [place]. She has been seen and followed by GI in the hospital and work up with MRCP again showed cholelithiasis and a 3 mm CBD stone. She is scheduled to have ERCP with Dr. Servando Snare today (05/16/18).   Surgery is consulted by hospitalist physician physician Dr. Adrian Saran, MD in this context for evaluation and management of symptomatic cholelithiasis and choledocholithiasis.   PAST MEDICAL HISTORY (PMH):  Past Medical History:  Diagnosis Date  . Constipation   . Seasonal allergies      PAST SURGICAL HISTORY (PSH):  Past Surgical History:  Procedure Laterality Date  . NO PAST SURGERIES       MEDICATIONS:  Prior to Admission medications   Medication Sig Start Date End Date Taking? Authorizing Provider  Prenatal Vit-Fe Fumarate-FA (PRENATAL MULTIVITAMIN) TABS tablet Take 1 tablet by mouth daily at 12 noon.   Yes [provider]  ibuprofen (ADVIL,MOTRIN) 600 MG tablet Take 1 tablet (600 mg total) by mouth every 6 (six) hours. Patient not taking: Reported on 05/15/2018 04/09/18   Doreene Burke, CNM     ALLERGIES:  No Known Allergies   SOCIAL HISTORY:  Social History   Socioeconomic History  . Marital status: Single    Spouse name: Not on file  . Number of children: Not on file  . Years of education: Not on file  . Highest education level: Not on file  Occupational History  . Occupation: Consulting civil engineer  Social Needs   . Financial resource strain: Not hard at all  . Food insecurity:    Worry: Never true    Inability: Never true  . Transportation needs:    Medical: No    Non-medical: No  Tobacco Use  . Smoking status: Never Smoker  . Smokeless tobacco: Never Used  Substance and Sexual Activity  . Alcohol use: Not Currently  . Drug use: No  . Sexual activity: Yes    Comment: unsure  Lifestyle  . Physical activity:    Days per week: 0 days    Minutes per session: 0 min  . Stress: Only a little  Relationships  . Social connections:    Talks on phone: Three times a week    Gets together: Three times a week    Attends religious service: 1 to 4 times per year    Active member of club or organization: No    Attends meetings of clubs or organizations: Never    Relationship status: Living with partner  . Intimate partner violence:    Fear of current or ex partner: No    Emotionally abused: No    Physically abused: No    Forced sexual activity: No  Other Topics Concern  . Not on file  Social History Narrative   Patient is in the 11th grade and lives with mother and father in [place].    The patient currently resides (home / rehab facility / nursing home): Home The patient normally is (ambulatory /  bedbound): Ambulatory   FAMILY HISTORY:  Family History  Problem Relation Age of Onset  . Gallbladder disease Mother      REVIEW OF SYSTEMS:  Constitutional: denies weight loss, fever, chills, or sweats  Eyes: denies any other vision changes, history of eye injury  ENT: denies sore throat, hearing problems  Respiratory: denies shortness of breath, wheezing  Cardiovascular: denies chest pain, palpitations  Gastrointestinal: + abdominal pain, + N/V, Denied diarrhea/and bowel function as per HPI Genitourinary: denies burning with urination or urinary frequency Musculoskeletal: denies any other joint pains or cramps  Skin: denies any other rashes or skin discolorations  Neurological: denies  any other headache, dizziness, weakness  Psychiatric: denies any other depression, anxiety   All other review of systems were negative   VITAL SIGNS:  Temp:  [98.1 F (36.7 C)-98.2 F (36.8 C)] 98.1 F (36.7 C) (10/25 0431) Pulse Rate:  [57-97] 97 (10/25 0824) Resp:  [16] 16 (10/25 0431) BP: (95-113)/(65-80) 113/80 (10/25 0824) SpO2:  [88 %-99 %] 88 % (10/25 0824) Weight:  [68.2 kg] 68.2 kg (10/25 0430)     Height: 5' (152.4 cm) Weight: 68.2 kg BMI (Calculated): 29.36   INTAKE/OUTPUT:  This shift: No intake/output data recorded.  Last 2 shifts: @IOLAST2SHIFTS @   PHYSICAL EXAM:  Constitutional:  -- Normal body habitus  -- Awake, alert, and oriented x3, no apparent distress Eyes:  -- Pupils equally round and reactive to light  -- No scleral icterus, B/L no occular discharge Ear, nose, throat: -- Neck is FROM WNL Pulmonary:  -- No wheezes or rhales -- Equal breath sounds bilaterally -- Breathing non-labored at rest Cardiovascular:  -- S1, S2 present  -- No pericardial rubs  Gastrointestinal:  -- Abdomen soft, RUQ tenderness, non-distended, no guarding or rebound tenderness -- No abdominal masses appreciated, pulsatile or otherwise  Musculoskeletal and Integumentary:  -- Wounds or skin discoloration: None appreciated -- Extremities: B/L UE and LE FROM, hands and feet warm, no edema  Neurologic:  -- Motor function: Intact and symmetric -- Sensation: Intact and symmetric Psychiatric:  -- Mood and affect WNL   Labs:  CBC Latest Ref Rng & Units 05/15/2018 05/14/2018 05/09/2018  WBC 4.0 - 10.5 K/uL 6.0 8.4 14.2(H)  Hemoglobin 12.0 - 15.0 g/dL 16.1 09.6 04.5  Hematocrit 36.0 - 46.0 % 39.4 41.1 38.5  Platelets 150 - 400 K/uL 286 313 305   CMP Latest Ref Rng & Units 05/16/2018 05/15/2018 05/14/2018  Glucose 70 - 99 mg/dL 80 91 409(W)  BUN 6 - 20 mg/dL 11 13 13   Creatinine 0.44 - 1.00 mg/dL 1.19 1.47 8.29  Sodium 135 - 145 mmol/L 138 139 136  Potassium 3.5 - 5.1 mmol/L  3.8 3.6 3.7  Chloride 98 - 111 mmol/L 107 106 106  CO2 22 - 32 mmol/L 25 23 23   Calcium 8.9 - 10.3 mg/dL 5.6(O) 1.3(Y) 9.1  Total Protein 6.5 - 8.1 g/dL 7.0 - 8.5(H)  Total Bilirubin 0.3 - 1.2 mg/dL 8.6(V) - 2.3(H)  Alkaline Phos 38 - 126 U/L 241(H) - 241(H)  AST 15 - 41 U/L 170(H) - 411(H)  ALT 0 - 44 U/L 317(H) - 427(H)     Imaging studies:   -- RUQ [place] on 05/14/18:   IMPRESSION: 1. Cholelithiasis with mild gallbladder distention. No gallbladder wall thickening or pericholecystic fluid. 2. Choledocholithiasis with a 4 mm stone in the distal common bile duct. Despite choledocholithiasis, no significant ductal dilatation, common duct measures 3 mm at the porta hepatis and 5  mm distally.  -- MRCP on 05/16/18:   IMPRESSION: 1. Cholelithiasis in addition to a suspected 3 mm distal CBD stone compatible with choledocholithiasis. No current biliary dilatation or abnormal enhancement along the biliary tree. No gallbladder wall thickening or pericholecystic fluid. No other significant abnormalities identified.   Assessment/Plan: (ICD-10's: K24.21) 22 y.o. female with symptomatic cholelithiasis without sonographic evidence of acute cholecystitis with choledocholithiasis and mild improving hyperbilirubinemia.              - NPO, IVF  - Seen and followed by GI. Plan for ERCP this afternoon with Dr. Servando Snare, appreciate their help. Will monitor for post-ERCP pancreatitis.   - Will plan for interval laparoscopic cholecystectomy during this hospital admission. Schedule and timing per Dr. Aleen Campi.   - Trend LFTs/Bilirubin             - if pain medication, prefer NSAIDs, acetaminophen, or fentanyl/tramadol over morphine derivatives (oxycodone, dilaudid, etc)  - avoid/minimize fattier foods (meats, cheeses/dairy, and fried foods), prefer vegetables, fruits, and whole grains          - Mobilize             - medical management of comorbidities   All of the above findings and recommendations were  discussed with the patient and her family, and all of patient's and her family's questions were answered to their expressed satisfaction.  Thank you for the opportunity to participate in this patient's care.  -- Lynden Oxford, PA-C [facility] 05/16/2018, 12:22 PM [phone] M-F: 7am - 4pm

## 2018-05-16 NOTE — Progress Notes (Signed)
Pt feeling better

## 2018-05-16 NOTE — Anesthesia Procedure Notes (Signed)
Procedure Name: Intubation Date/Time: 05/16/2018 1:41 PM Performed by: Willette Alma, CRNA Pre-anesthesia Checklist: Patient identified, Patient being monitored, Timeout performed, Emergency Drugs available and Suction available Patient Re-evaluated:Patient Re-evaluated prior to induction Oxygen Delivery Method: Circle system utilized Preoxygenation: Pre-oxygenation with 100% oxygen Induction Type: IV induction Ventilation: Mask ventilation without difficulty Laryngoscope Size: Mac and 4 Grade View: Grade I Tube type: Oral Tube size: 7.0 mm Number of attempts: 1 Airway Equipment and Method: Stylet Placement Confirmation: ETT inserted through vocal cords under direct vision,  positive ETCO2 and breath sounds checked- equal and bilateral Secured at: 21 cm Tube secured with: Tape Dental Injury: Teeth and Oropharynx as per pre-operative assessment

## 2018-05-16 NOTE — Progress Notes (Signed)
[employer] [facility] [place] [facility]   PATIENT NAME: Gina Lester    MR#:  [id number]  DATE OF BIRTH:  01/06/1996  SUBJECTIVE:   Rapid response called this morning due to patient's pain Pain is better controlled after Dilaudid given.  REVIEW OF SYSTEMS:    Review of Systems  Constitutional: Negative for fever, chills weight loss HENT: Negative for ear pain, nosebleeds, congestion, facial swelling, rhinorrhea, neck pain, neck stiffness and ear discharge.   Respiratory: Negative for cough, shortness of breath, wheezing  Cardiovascular: Negative for chest pain, palpitations and leg swelling.  Gastrointestinal: Positive nausea with some mild abdominal pain however improved since this morning Genitourinary: Negative for dysuria, urgency, frequency, hematuria Musculoskeletal: Negative for back pain or joint pain Neurological: Negative for dizziness, seizures, syncope, focal weakness,  numbness and headaches.  Hematological: Does not bruise/bleed easily.  Psychiatric/Behavioral: Negative for hallucinations, confusion, dysphoric mood    Tolerating Diet: npo      DRUG ALLERGIES:  No Known Allergies  VITALS:  Blood pressure 113/80, pulse 97, temperature 98.1 F (36.7 C), temperature source Oral, resp. rate 16, height 5' (1.524 m), weight 68.2 kg, SpO2 (!) 88 %, currently breastfeeding.  PHYSICAL EXAMINATION:  Constitutional: Appears well-developed and well-nourished. No distress. HENT: Normocephalic. Marland Kitchen Oropharynx is clear and moist.  Eyes: Conjunctivae and EOM are normal. PERRLA, no scleral icterus.  Neck: Normal ROM. Neck supple. No JVD. No tracheal deviation. CVS: RRR, S1/S2 +, no murmurs, no gallops, no carotid bruit.  Pulmonary: Effort and breath sounds normal, no stridor, rhonchi, wheezes, rales.  Abdominal: Soft. BS +, epigastric/right upper quadrant mild tenderness without rebound or guarding  musculoskeletal: Normal range of motion. No edema and  no tenderness.  Neuro: Alert. CN 2-12 grossly intact. No focal deficits. Skin: Skin is warm and dry. No rash noted. Psychiatric: Normal mood and affect.      LABORATORY PANEL:   CBC Recent Labs  Lab 05/15/18 0819  WBC 6.0  HGB 12.0  HCT 39.4  PLT 286   ------------------------------------------------------------------------------------------------------------------  Chemistries  Recent Labs  Lab 05/16/18 0540  NA 138  K 3.8  CL 107  CO2 25  GLUCOSE 80  BUN 11  CREATININE 0.48  CALCIUM 8.6*  AST 170*  ALT 317*  ALKPHOS 241*  BILITOT 1.5*   ------------------------------------------------------------------------------------------------------------------  Cardiac Enzymes No results for input(s): TROPONINI in the last 168 hours. ------------------------------------------------------------------------------------------------------------------  RADIOLOGY:  Mr 3d Recon At Scanner  Result Date: 05/16/2018 CLINICAL DATA:  Right upper quadrant abdominal pain with nausea and vomiting. Five weeks postpartum. Cholelithiasis and choledocholithiasis. EXAM: MRI ABDOMEN WITHOUT AND WITH CONTRAST (INCLUDING MRCP) TECHNIQUE: Multiplanar multisequence MR imaging of the abdomen was performed both before and after the administration of intravenous contrast. Heavily T2-weighted images of the biliary and pancreatic ducts were obtained, and three-dimensional MRCP images were rendered by post processing. CONTRAST:  6.5 cc Gadavist COMPARISON:  Ultrasound from 05/14/2018 FINDINGS: Despite efforts by the technologist and patient, motion artifact is present on today's exam and could not be eliminated. This reduces exam sensitivity and specificity. Lower chest: Unremarkable Hepatobiliary: Multiple small gallstones in the 3 mm diameter range as shown on images 17-18 of series 3. Due to patient motion, the distal most CBD was excluded on the dedicated MRCP images, but there is a suggestion of a up to  3 mm in diameter distal CBD stone on image 14/3. No biliary dilatation. No significant abnormal enhancement along the biliary tree. No significant focal hepatic lesion or  gallbladder wall thickening. No pericholecystic fluid. Pancreas:  Unremarkable Spleen:  Unremarkable Adrenals/Urinary Tract:  Unremarkable Stomach/Bowel: Unremarkable Vascular/Lymphatic:  Unremarkable Other:  No supplemental non-categorized findings. Musculoskeletal: Unremarkable IMPRESSION: 1. Cholelithiasis in addition to a suspected 3 mm distal CBD stone compatible with choledocholithiasis. No current biliary dilatation or abnormal enhancement along the biliary tree. No gallbladder wall thickening or pericholecystic fluid. No other significant abnormalities identified. Electronically Signed   By: Gaylyn Rong M.D.   On: 05/16/2018 07:10   Mr Abdomen Mrcp Vivien Rossetti Contast  Result Date: 05/16/2018 CLINICAL DATA:  Right upper quadrant abdominal pain with nausea and vomiting. Five weeks postpartum. Cholelithiasis and choledocholithiasis. EXAM: MRI ABDOMEN WITHOUT AND WITH CONTRAST (INCLUDING MRCP) TECHNIQUE: Multiplanar multisequence MR imaging of the abdomen was performed both before and after the administration of intravenous contrast. Heavily T2-weighted images of the biliary and pancreatic ducts were obtained, and three-dimensional MRCP images were rendered by post processing. CONTRAST:  6.5 cc Gadavist COMPARISON:  Ultrasound from 05/14/2018 FINDINGS: Despite efforts by the technologist and patient, motion artifact is present on today's exam and could not be eliminated. This reduces exam sensitivity and specificity. Lower chest: Unremarkable Hepatobiliary: Multiple small gallstones in the 3 mm diameter range as shown on images 17-18 of series 3. Due to patient motion, the distal most CBD was excluded on the dedicated MRCP images, but there is a suggestion of a up to 3 mm in diameter distal CBD stone on image 14/3. No biliary dilatation.  No significant abnormal enhancement along the biliary tree. No significant focal hepatic lesion or gallbladder wall thickening. No pericholecystic fluid. Pancreas:  Unremarkable Spleen:  Unremarkable Adrenals/Urinary Tract:  Unremarkable Stomach/Bowel: Unremarkable Vascular/Lymphatic:  Unremarkable Other:  No supplemental non-categorized findings. Musculoskeletal: Unremarkable IMPRESSION: 1. Cholelithiasis in addition to a suspected 3 mm distal CBD stone compatible with choledocholithiasis. No current biliary dilatation or abnormal enhancement along the biliary tree. No gallbladder wall thickening or pericholecystic fluid. No other significant abnormalities identified. Electronically Signed   By: Gaylyn Rong M.D.   On: 05/16/2018 07:10   US Abdomen Limited Ruq  Result Date: 05/14/2018 CLINICAL DATA:  Right upper quadrant pain. EXAM: ULTRASOUND ABDOMEN LIMITED RIGHT UPPER QUADRANT COMPARISON:  Right upper quadrant ultrasound on 05/16/2017 FINDINGS: Gallbladder: Mild gallbladder distention with multiple gallstones. No gallbladder wall thickening, wall thickness is 2 mm. No pericholecystic fluid. No sonographic Murphy sign noted by sonographer. Common bile duct: Diameter: 3 mm at the porta hepatis, 5 mm distally. There is a 4 mm stone in the distal common bile duct. Liver: No focal lesion identified. Within normal limits in parenchymal echogenicity. Portal vein is patent on color Doppler imaging with normal direction of blood flow towards the liver. IMPRESSION: 1. Cholelithiasis with mild gallbladder distention. No gallbladder wall thickening or pericholecystic fluid. 2. Choledocholithiasis with a 4 mm stone in the distal common bile duct. Despite choledocholithiasis, no significant ductal dilatation, common duct measures 3 mm at the porta hepatis and 5 mm distally. Electronically Signed   By: Narda Rutherford M.D.   On: 05/14/2018 23:59     ASSESSMENT AND PLAN:   22 year old female 5 weeks postpartum  who presented to the emergency room due to right upper quadrant abdominal pain.  1.  Cholelithiasis with 3 mm distal CBD stone: Case discussed with GI and plan for ERCP today After ERCP patient will need a cholecystectomy and will need surgery consultation. Left message for Dr Aleen Campi.  Continue pain medications PRN   Management plans discussed with the patient  and she is in agreement.  CODE STATUS: full  TOTAL TIME TAKING CARE OF THIS PATIENT: 30 minutes.     POSSIBLE D/C 2-3 days, DEPENDING ON CLINICAL CONDITION.   Farouk Vivero M.D on 05/16/2018 at 10:37 AM  Between 7am to 6pm - Pager - [phone] After 6pm go to [email or web address] - password [email or web address]  [facility] [place] [facility]  Office  [phone]  CC: Primary care physician; Patient, No Pcp Per  Note: This dictation was prepared with Dragon dictation along with smaller phrase technology. Any transcriptional errors that result from this process are unintentional.

## 2018-05-16 NOTE — OR Nursing (Signed)
Spoke with Dr. Maisie Fus regarding pt delivering her baby 04/08/2018, urine pregnancy test negative on 05/09/2018.  Dr. Maisie Fus said we do not need another pregnancy test.

## 2018-05-16 NOTE — Op Note (Signed)
[facility] Gastroenterology Patient Name: Gina Lester Procedure Date: 05/16/2018 1:33 PM MRN: [id number] Account #: [id number] Date of Birth: 1995-10-10 Admit Type: Inpatient Age: 22 Room: [facility] ENDO ROOM 4 Gender: Female Note Status: Finalized Procedure:            ERCP Indications:          Bile duct stone(s) Providers:            Midge Minium MD, MD Referring MD:         No Local Md, MD (Referring MD) Medicines:            General Anesthesia Complications:        No immediate complications. Procedure:            Pre-Anesthesia Assessment:                       - Prior to the procedure, a History and Physical was                        performed, and patient medications and allergies were                        reviewed. The patient's tolerance of previous                        anesthesia was also reviewed. The risks and benefits of                        the procedure and the sedation options and risks were                        discussed with the patient. All questions were                        answered, and informed consent was obtained. Prior                        Anticoagulants: The patient has taken no previous                        anticoagulant or antiplatelet agents. ASA Grade                        Assessment: II - A patient with mild systemic disease.                        After reviewing the risks and benefits, the patient was                        deemed in satisfactory condition to undergo the                        procedure.                       After obtaining informed consent, the scope was passed                        under direct vision. Throughout the procedure, the  patient's blood pressure, pulse, and oxygen saturations                        were monitored continuously. The Duodenoscope was                        introduced through the mouth, and used to inject                        contrast into  and used to cannulate the bile duct. The                        ERCP was accomplished without difficulty. The patient                        tolerated the procedure well. Findings:      The scout film was normal. The upper GI tract was traversed under direct       vision without detailed examination. A large amount of food (residue)       was found in the entire examined stomach. The major papilla was normal.       The bile duct was deeply cannulated with the short-nosed traction       sphincterotome. Contrast was injected. I personally interpreted the bile       duct images. There was brisk flow of contrast through the ducts. Image       quality was excellent. Contrast extended to the hepatic ducts. The lower       third of the main bile duct contained one stone, which was 3 mm in       diameter. A wire was passed into the biliary tree. A 6 mm biliary       sphincterotomy was made with a traction (standard) sphincterotome using       ERBE electrocautery. There was no post-sphincterotomy bleeding. The       biliary tree was swept with a 15 mm balloon starting at the bifurcation.       One stone was removed. No stones remained. Impression:           - A large amount of food (residue) in the stomach.                       - The major papilla appeared normal.                       - Choledocholithiasis was found. Complete removal was                        accomplished by biliary sphincterotomy and balloon                        extraction.                       - A biliary sphincterotomy was performed.                       - The biliary tree was swept. Recommendation:       - Return patient to hospital ward for ongoing care.                       -  Clear liquid diet.                       - Continue present medications.                       - Watch for pancreatitis, bleeding, perforation, and                        cholangitis. Procedure Code(s):    --- Professional ---                        [phone], Endoscopic retrograde cholangiopancreatography                        (ERCP); with removal of calculi/debris from                        biliary/pancreatic duct(s)                       43262, Endoscopic retrograde cholangiopancreatography                        (ERCP); with sphincterotomy/papillotomy                       [phone], Endoscopic catheterization of the biliary ductal                        system, radiological supervision and interpretation Diagnosis Code(s):    --- Professional ---                       K80.50, Calculus of bile duct without cholangitis or                        cholecystitis without obstruction CPT copyright 2018 American Medical Association. All rights reserved. The codes documented in this report are preliminary and upon coder review may  be revised to meet current compliance requirements. Midge Minium MD, MD 05/16/2018 2:08:14 PM This report has been signed electronically. Number of Addenda: 0 Note Initiated On: 05/16/2018 1:33 PM      [facility]

## 2018-05-16 NOTE — Progress Notes (Signed)
Phenergan 6.25 given for coughing and sick to stomach  Large pieces of food noted in hair

## 2018-05-16 NOTE — Progress Notes (Signed)
Pt . was in 7 -pain so dilaudid given.   A few seconds,  her responsiveness has decreased. Rapid response called. VSS . Her abdominal pain just  worsen. Pt. Dry heaving. GI and prime doc called.

## 2018-05-16 NOTE — Anesthesia Postprocedure Evaluation (Signed)
Anesthesia Post Note  Patient: Gina Lester  Procedure(s) Performed: ENDOSCOPIC RETROGRADE CHOLANGIOPANCREATOGRAPHY (ERCP) WITH PROPOFOL (N/A )  Patient location during evaluation: Endoscopy Anesthesia Type: General Level of consciousness: awake and alert Pain management: pain level controlled Vital Signs Assessment: post-procedure vital signs reviewed and stable Respiratory status: spontaneous breathing, nonlabored ventilation, respiratory function stable and patient connected to nasal cannula oxygen Cardiovascular status: blood pressure returned to baseline and stable Postop Assessment: no apparent nausea or vomiting Anesthetic complications: no     Last Vitals:  Vitals:   05/16/18 1455 05/16/18 1506  BP: 96/67 93/64  Pulse: 67 66  Resp: 17 19  Temp:  (!) 36.1 C  SpO2: 100% 100%    Last Pain:  Vitals:   05/16/18 1455  TempSrc:   PainSc: 0-No pain                 Daesean Lazarz S

## 2018-05-16 NOTE — Progress Notes (Signed)
At 8:24  Pt. CBG was 62.  D50 was given.  Pt CBG re-checked was 175.

## 2018-05-16 NOTE — Transfer of Care (Signed)
Immediate Anesthesia Transfer of Care Note  Patient: Gina Lester  Procedure(s) Performed: ENDOSCOPIC RETROGRADE CHOLANGIOPANCREATOGRAPHY (ERCP) WITH PROPOFOL (N/A )  Patient Location: PACU  Anesthesia Type:General  Level of Consciousness: awake, alert  and oriented  Airway & Oxygen Therapy: Patient Spontanous Breathing and Patient connected to face mask oxygen  Post-op Assessment: Report given to RN and Post -op Vital signs reviewed and stable  Post vital signs: stable  Last Vitals:  Vitals Value Taken Time  BP    Temp    Pulse    Resp    SpO2      Last Pain:  Vitals:   05/16/18 1258  TempSrc: Tympanic  PainSc: 10-Worst pain ever         Complications: No apparent anesthesia complications

## 2018-05-16 NOTE — Progress Notes (Signed)
Chaplain responded to a RR while in morning report. Secretary said there was family. Chaplain responded ASAP from [facility]. Upon arrival Nurse and SWOt was only care team remaining along with husband. Husbands escaped into rest room. Pt said pain level was 4. Chaplain asked if there were any other needs. None were expressed.    05/16/18 0800  Clinical Encounter Type  Visited With Patient and family together  Visit Type Initial;Spiritual support;Code  Referral From Care management  Spiritual Encounters  Spiritual Needs Prayer

## 2018-05-17 ENCOUNTER — Encounter: Payer: Self-pay | Admitting: Anesthesiology

## 2018-05-17 ENCOUNTER — Inpatient Hospital Stay: Payer: BLUE CROSS/BLUE SHIELD

## 2018-05-17 ENCOUNTER — Encounter
Admission: EM | Disposition: A | Discharge status: Home / Self Care | Payer: Self-pay | Source: Home / Self Care | Attending: Internal Medicine

## 2018-05-17 ENCOUNTER — Inpatient Hospital Stay: Payer: BLUE CROSS/BLUE SHIELD | Admitting: Anesthesiology

## 2018-05-17 HISTORY — PX: CHOLECYSTECTOMY: SHX55

## 2018-05-17 LAB — COMPREHENSIVE METABOLIC PANEL
ALBUMIN: 3.3 g/dL — AB (ref 3.5–5.0)
ALT: 227 U/L — AB (ref 0–44)
AST: 86 U/L — AB (ref 15–41)
Alkaline Phosphatase: 217 U/L — ABNORMAL HIGH (ref 38–126)
Anion gap: 8 (ref 5–15)
BILIRUBIN TOTAL: 0.9 mg/dL (ref 0.3–1.2)
BUN: 6 mg/dL (ref 6–20)
CO2: 24 mmol/L (ref 22–32)
CREATININE: 0.49 mg/dL (ref 0.44–1.00)
Calcium: 8.5 mg/dL — ABNORMAL LOW (ref 8.9–10.3)
Chloride: 108 mmol/L (ref 98–111)
GFR calc Af Amer: >60 mL/min (ref >60)
GLUCOSE: 90 mg/dL (ref 70–99)
Potassium: 3.3 mmol/L — ABNORMAL LOW (ref 3.5–5.1)
Sodium: 140 mmol/L (ref 135–145)
TOTAL PROTEIN: 7 g/dL (ref 6.5–8.1)

## 2018-05-17 LAB — CBC
HCT: 35.3 % — ABNORMAL LOW (ref 36.0–46.0)
Hemoglobin: 10.8 g/dL — ABNORMAL LOW (ref 12.0–15.0)
MCH: 22.8 pg — AB (ref 26.0–34.0)
MCHC: 30.6 g/dL (ref 30.0–36.0)
MCV: 74.6 fL — ABNORMAL LOW (ref 80.0–100.0)
Platelets: 252 10*3/uL (ref 150–400)
RBC: 4.73 MIL/uL (ref 3.87–5.11)
RDW: 22.9 % — AB (ref 11.5–15.5)
WBC: 6.8 10*3/uL (ref 4.0–10.5)
nRBC: 0 % (ref 0.0–0.2)

## 2018-05-17 LAB — PREGNANCY, URINE: PREG TEST UR: NEGATIVE

## 2018-05-17 LAB — GLUCOSE, CAPILLARY
GLUCOSE-CAPILLARY: 151 mg/dL — AB (ref 70–99)
Glucose-Capillary: 96 mg/dL (ref 70–99)

## 2018-05-17 LAB — LIPASE, BLOOD: LIPASE: 26 U/L (ref 11–51)

## 2018-05-17 SURGERY — LAPAROSCOPIC CHOLECYSTECTOMY WITH INTRAOPERATIVE CHOLANGIOGRAM
Anesthesia: General

## 2018-05-17 MED ORDER — FENTANYL CITRATE (PF) 100 MCG/2ML IJ SOLN
INTRAMUSCULAR | Status: AC
  Filled 2018-05-17: qty 2

## 2018-05-17 MED ORDER — ACETAMINOPHEN 10 MG/ML IV SOLN
INTRAVENOUS | Status: AC
  Filled 2018-05-17: qty 100

## 2018-05-17 MED ORDER — PROPOFOL 10 MG/ML IV BOLUS
INTRAVENOUS | Status: AC
  Filled 2018-05-17: qty 20

## 2018-05-17 MED ORDER — HYDROMORPHONE HCL 1 MG/ML IJ SOLN
INTRAMUSCULAR | Status: AC
  Filled 2018-05-17: qty 1

## 2018-05-17 MED ORDER — ROCURONIUM BROMIDE 100 MG/10ML IV SOLN
INTRAVENOUS | Status: DC | PRN
  Administered 2018-05-17: 40 mg via INTRAVENOUS
  Administered 2018-05-17: 10 mg via INTRAVENOUS

## 2018-05-17 MED ORDER — KETOROLAC TROMETHAMINE 30 MG/ML IJ SOLN
INTRAMUSCULAR | Status: AC
  Filled 2018-05-17: qty 1

## 2018-05-17 MED ORDER — OXYCODONE HCL 5 MG/5ML PO SOLN: 5.0000 mg | Freq: Once | ORAL | Status: DC | PRN

## 2018-05-17 MED ORDER — FENTANYL CITRATE (PF) 100 MCG/2ML IJ SOLN
25.0000 ug | INTRAMUSCULAR | Status: DC | PRN
  Administered 2018-05-17 (×4): 50 ug via INTRAVENOUS

## 2018-05-17 MED ORDER — BUPIVACAINE-EPINEPHRINE (PF) 0.25% -1:200000 IJ SOLN
INTRAMUSCULAR | Status: AC
  Filled 2018-05-17: qty 30

## 2018-05-17 MED ORDER — FENTANYL CITRATE (PF) 100 MCG/2ML IJ SOLN
INTRAMUSCULAR | Status: DC | PRN
  Administered 2018-05-17: 100 ug via INTRAVENOUS

## 2018-05-17 MED ORDER — OXYCODONE HCL 5 MG PO TABS
5.0000 mg | ORAL_TABLET | ORAL | Status: DC | PRN
  Administered 2018-05-17: 10 mg via ORAL
  Administered 2018-05-17: 5 mg via ORAL
  Administered 2018-05-18: 10 mg via ORAL
  Filled 2018-05-17: qty 1
  Filled 2018-05-17 (×2): qty 2

## 2018-05-17 MED ORDER — OXYCODONE HCL 5 MG PO TABS: 5.0000 mg | ORAL_TABLET | Freq: Once | ORAL | Status: DC | PRN

## 2018-05-17 MED ORDER — MIDAZOLAM HCL 2 MG/2ML IJ SOLN
INTRAMUSCULAR | Status: AC
  Filled 2018-05-17: qty 2

## 2018-05-17 MED ORDER — BUPIVACAINE-EPINEPHRINE (PF) 0.25% -1:200000 IJ SOLN
INTRAMUSCULAR | Status: DC | PRN
  Administered 2018-05-17: 30 mL

## 2018-05-17 MED ORDER — PROPOFOL 10 MG/ML IV BOLUS
INTRAVENOUS | Status: DC | PRN
  Administered 2018-05-17: 150 mg via INTRAVENOUS

## 2018-05-17 MED ORDER — SUGAMMADEX SODIUM 200 MG/2ML IV SOLN
INTRAVENOUS | Status: AC
  Filled 2018-05-17: qty 2

## 2018-05-17 MED ORDER — ACETAMINOPHEN 10 MG/ML IV SOLN
INTRAVENOUS | Status: DC | PRN
  Administered 2018-05-17: 1000 mg via INTRAVENOUS

## 2018-05-17 MED ORDER — KETOROLAC TROMETHAMINE 30 MG/ML IJ SOLN
30.0000 mg | Freq: Four times a day (QID) | INTRAMUSCULAR | Status: DC
  Administered 2018-05-17 – 2018-05-18 (×3): 30 mg via INTRAVENOUS
  Filled 2018-05-17 (×3): qty 1

## 2018-05-17 MED ORDER — PHENYLEPHRINE HCL 10 MG/ML IJ SOLN
INTRAMUSCULAR | Status: DC | PRN
  Administered 2018-05-17: 100 ug via INTRAVENOUS

## 2018-05-17 MED ORDER — SUGAMMADEX SODIUM 200 MG/2ML IV SOLN
INTRAVENOUS | Status: DC | PRN
  Administered 2018-05-17: 200 mg via INTRAVENOUS

## 2018-05-17 MED ORDER — HYDROMORPHONE HCL 1 MG/ML IJ SOLN
INTRAMUSCULAR | Status: DC | PRN
  Administered 2018-05-17: .5 mg via INTRAVENOUS
  Administered 2018-05-17 (×2): 0.5 mg via INTRAVENOUS
  Administered 2018-05-17: .5 mg via INTRAVENOUS

## 2018-05-17 MED ORDER — MIDAZOLAM HCL 2 MG/2ML IJ SOLN
INTRAMUSCULAR | Status: DC | PRN
  Administered 2018-05-17: 2 mg via INTRAVENOUS

## 2018-05-17 MED ORDER — CEFAZOLIN SODIUM-DEXTROSE 2-4 GM/100ML-% IV SOLN
2.0000 g | INTRAVENOUS | Status: AC
  Administered 2018-05-17: 2 g via INTRAVENOUS
  Filled 2018-05-17: qty 100

## 2018-05-17 MED ORDER — KETOROLAC TROMETHAMINE 30 MG/ML IJ SOLN
INTRAMUSCULAR | Status: DC | PRN
  Administered 2018-05-17: 30 mg via INTRAVENOUS

## 2018-05-17 MED ORDER — ROCURONIUM BROMIDE 50 MG/5ML IV SOLN
INTRAVENOUS | Status: AC
  Filled 2018-05-17: qty 1

## 2018-05-17 MED ORDER — LIDOCAINE HCL (PF) 2 % IJ SOLN
INTRAMUSCULAR | Status: AC
  Filled 2018-05-17: qty 10

## 2018-05-17 MED ORDER — DEXAMETHASONE SODIUM PHOSPHATE 10 MG/ML IJ SOLN
INTRAMUSCULAR | Status: DC | PRN
  Administered 2018-05-17: 10 mg via INTRAVENOUS

## 2018-05-17 MED ORDER — LACTATED RINGERS IV SOLN: INTRAVENOUS | Status: DC | PRN

## 2018-05-17 SURGICAL SUPPLY — 43 items
APPLIER CLIP 5 13 M/L LIGAMAX5 (MISCELLANEOUS) ×3
BLADE SURG 15 STRL LF DISP TIS (BLADE) ×1 IMPLANT
BLADE SURG 15 STRL SS (BLADE) ×2
CANISTER SUCT 1200ML W/VALVE (MISCELLANEOUS) ×3 IMPLANT
CATH CHOLANGI 4FR 420404F (CATHETERS) IMPLANT
CHLORAPREP W/TINT 26ML (MISCELLANEOUS) ×3 IMPLANT
CLIP APPLIE 5 13 M/L LIGAMAX5 (MISCELLANEOUS) ×1 IMPLANT
CONRAY 60ML FOR OR (MISCELLANEOUS) IMPLANT
COVER WAND RF STERILE (DRAPES) IMPLANT
DERMABOND ADVANCED (GAUZE/BANDAGES/DRESSINGS) ×2
DERMABOND ADVANCED .7 DNX12 (GAUZE/BANDAGES/DRESSINGS) ×1 IMPLANT
DRAPE C-ARM XRAY 36X54 (DRAPES) IMPLANT
ELECT REM PT RETURN 9FT ADLT (ELECTROSURGICAL) ×3
ELECTRODE REM PT RTRN 9FT ADLT (ELECTROSURGICAL) ×1 IMPLANT
FILTER LAP SMOKE EVAC STRL (MISCELLANEOUS) IMPLANT
GLOVE SURG SYN 7.0 (GLOVE) ×9 IMPLANT
GLOVE SURG SYN 7.5  E (GLOVE) ×2
GLOVE SURG SYN 7.5 E (GLOVE) ×1 IMPLANT
GOWN STRL REUS W/ TWL LRG LVL3 (GOWN DISPOSABLE) ×2 IMPLANT
GOWN STRL REUS W/TWL LRG LVL3 (GOWN DISPOSABLE) ×4
IRRIGATION STRYKERFLOW (MISCELLANEOUS) ×1 IMPLANT
IRRIGATOR STRYKERFLOW (MISCELLANEOUS) ×3
IV CATH ANGIO 12GX3 LT BLUE (NEEDLE) IMPLANT
IV NS 1000ML (IV SOLUTION) ×2
IV NS 1000ML BAXH (IV SOLUTION) ×1 IMPLANT
JACKSON PRATT 10 (INSTRUMENTS) IMPLANT
L-HOOK LAP DISP 36CM (ELECTROSURGICAL) ×3
LABEL OR SOLS (LABEL) ×3 IMPLANT
LHOOK LAP DISP 36CM (ELECTROSURGICAL) ×1 IMPLANT
NEEDLE HYPO 22GX1.5 SAFETY (NEEDLE) ×3 IMPLANT
PACK LAP CHOLECYSTECTOMY (MISCELLANEOUS) ×3 IMPLANT
PENCIL ELECTRO HAND CTR (MISCELLANEOUS) ×3 IMPLANT
POUCH SPECIMEN RETRIEVAL 10MM (ENDOMECHANICALS) ×3 IMPLANT
SCISSORS METZENBAUM CVD 33 (INSTRUMENTS) ×3 IMPLANT
SLEEVE ADV FIXATION 5X100MM (TROCAR) ×9 IMPLANT
SPONGE VERSALON 4X4 4PLY (MISCELLANEOUS) IMPLANT
SUT MNCRL 4-0 (SUTURE) ×2
SUT MNCRL 4-0 27XMFL (SUTURE) ×1
SUT VICRYL 0 AB UR-6 (SUTURE) ×3 IMPLANT
SUTURE MNCRL 4-0 27XMF (SUTURE) ×1 IMPLANT
TROCAR BALLN GELPORT 12X130M (ENDOMECHANICALS) ×3 IMPLANT
TROCAR Z-THREAD OPTICAL 5X100M (TROCAR) ×3 IMPLANT
TUBING INSUFFLATION (TUBING) ×3 IMPLANT

## 2018-05-17 NOTE — Anesthesia Postprocedure Evaluation (Signed)
Anesthesia Post Note  Patient: Gina Lester  Procedure(s) Performed: LAPAROSCOPIC CHOLECYSTECTOMY (N/A )  Patient location during evaluation: PACU Anesthesia Type: General Level of consciousness: awake and alert Pain management: pain level controlled Vital Signs Assessment: post-procedure vital signs reviewed and stable Respiratory status: spontaneous breathing, nonlabored ventilation, respiratory function stable and patient connected to nasal cannula oxygen Cardiovascular status: blood pressure returned to baseline and stable Postop Assessment: no apparent nausea or vomiting Anesthetic complications: no     Last Vitals:  Vitals:   05/17/18 1533 05/17/18 1537  BP: 107/63   Pulse: 64 78  Resp: 12 14  Temp: (!) 36.3 C   SpO2: 100% 100%    Last Pain:  Vitals:   05/17/18 1533  TempSrc:   PainSc: 0-No pain                 [name]

## 2018-05-17 NOTE — Anesthesia Procedure Notes (Signed)
Procedure Name: Intubation Date/Time: 05/17/2018 1:09 PM Performed by: Estanislado Emms, CRNA Pre-anesthesia Checklist: Patient identified, Patient being monitored, Timeout performed, Emergency Drugs available and Suction available Patient Re-evaluated:Patient Re-evaluated prior to induction Oxygen Delivery Method: Circle system utilized Preoxygenation: Pre-oxygenation with 100% oxygen Induction Type: IV induction Ventilation: Mask ventilation without difficulty Laryngoscope Size: Miller and 2 Grade View: Grade I Tube type: Oral Tube size: 7.0 mm Number of attempts: 1 Airway Equipment and Method: Stylet Placement Confirmation: ETT inserted through vocal cords under direct vision,  positive ETCO2 and breath sounds checked- equal and bilateral Secured at: 21 cm Tube secured with: Tape Dental Injury: Teeth and Oropharynx as per pre-operative assessment

## 2018-05-17 NOTE — Anesthesia Preprocedure Evaluation (Signed)
Anesthesia Evaluation  Patient identified by MRN, date of birth, ID band Patient awake    Reviewed: Allergy & Precautions, H&P , NPO status , Patient's Chart, lab work & pertinent test results  History of Anesthesia Complications Negative for: history of anesthetic complications  Airway Mallampati: III  TM Distance: >3 FB Neck ROM: full    Dental  (+) Chipped   Pulmonary neg pulmonary ROS, neg shortness of breath,           Cardiovascular Exercise Tolerance: Good (-) angina(-) Past MI and (-) DOE negative cardio ROS       Neuro/Psych negative neurological ROS  negative psych ROS   GI/Hepatic negative GI ROS, Neg liver ROS, neg GERD  ,  Endo/Other  negative endocrine ROS  Renal/GU      Musculoskeletal   Abdominal   Peds  Hematology negative hematology ROS (+)   Anesthesia Other Findings Past Medical History: No date: Constipation No date: Seasonal allergies  Past Surgical History: No date: NO PAST SURGERIES  BMI    Body Mass Index:  29.19 kg/m      Reproductive/Obstetrics negative OB ROS                             Anesthesia Physical Anesthesia Plan  ASA: III  Anesthesia Plan: General ETT   Post-op Pain Management:    Induction: Intravenous  PONV Risk Score and Plan: Ondansetron, Dexamethasone, Midazolam and Treatment may vary due to age or medical condition  Airway Management Planned: Oral ETT  Additional Equipment:   Intra-op Plan:   Post-operative Plan: Extubation in OR  Informed Consent: I have reviewed the patients History and Physical, chart, labs and discussed the procedure including the risks, benefits and alternatives for the proposed anesthesia with the patient or authorized representative who has indicated his/her understanding and acceptance.   Dental Advisory Given  Plan Discussed with: Anesthesiologist, CRNA and Surgeon  Anesthesia Plan  Comments: (Patient consented for risks of anesthesia including but not limited to:  - adverse reactions to medications - damage to teeth, lips or other oral mucosa - sore throat or hoarseness - Damage to heart, brain, lungs or loss of life  Patient voiced understanding.)        Anesthesia Quick Evaluation

## 2018-05-17 NOTE — Progress Notes (Signed)
05/17/2018  Subjective: No acute events overnight.  Patient denies any nausea vomiting and reports having some abdominal discomfort in the epigastric and right upper quadrant.  This has improved compared to yesterday.  Vital signs: Temp:  [97.3 F (36.3 C)-98.4 F (36.9 C)] 97.4 F (36.3 C) (10/26 1533) Pulse Rate:  [58-79] 70 (10/26 1550) Resp:  [10-21] 10 (10/26 1550) BP: (90-118)/(53-79) 115/68 (10/26 1550) SpO2:  [98 %-100 %] 100 % (10/26 1550) Weight:  [67.8 kg] 67.8 kg (10/26 0500)   Intake/Output: 10/25 0701 - 10/26 0700 In: 1282.5 [I.V.:1282.5] Out: -  Last BM Date: 05/15/18  Physical Exam: Constitutional: No acute distress Abdomen: Soft, nondistended, with mild tenderness to palpation in the epigastric and right upper quadrant region.  No peritoneal signs and negative Murphy sign.  Labs:  Recent Labs    05/15/18 0819 05/17/18 0739  WBC 6.0 6.8  HGB 12.0 10.8*  HCT 39.4 35.3*  PLT 286 252   Recent Labs    05/16/18 0540 05/17/18 0739  NA 138 140  K 3.8 3.3*  CL 107 108  CO2 25 24  GLUCOSE 80 90  BUN 11 6  CREATININE 0.48 0.49  CALCIUM 8.6* 8.5*   No results for input(s): LABPROT, INR in the last 72 hours.  Imaging: No results found.  Assessment/Plan: This is a 22 y.o. female with choledocholithiasis status post ERCP yesterday with Dr. Servando Snare.  Her LFTs this morning are improving and there is no evidence of any post ERCP pancreatitis.  We will take her to the operating room today for laparoscopic cholecystectomy.  Discussed risks of bleeding, infection, injury to surrounding structures with the patient and she is willing to proceed.   Howie Ill, MD [facility]

## 2018-05-17 NOTE — Progress Notes (Signed)
Patient is looking and feeling much better. She is able to move both legs and is currently sitting up in bed and eating her clear liquid dinner. Family is at the bedside. Patient has no complaints and expresses no needs at this time.

## 2018-05-17 NOTE — Op Note (Signed)
  Procedure Date:  05/17/2018  Pre-operative Diagnosis: Choledocholithiasis  Post-operative Diagnosis: Choledocholithiasis  Procedure:  Laparoscopic cholecystectomy  Surgeon:  Howie Ill, MD  Anesthesia:  General endotracheal  Estimated Blood Loss: 10 ml  Specimens:  gallbladder  Complications: None  Indications for Procedure:  This is a 22 y.o. female who presents with abdominal pain and workup revealing choledocholithiasis.  The benefits, complications, treatment options, and expected outcomes were discussed with the patient. The risks of bleeding, infection, recurrence of symptoms, failure to resolve symptoms, bile duct damage, bile duct leak, retained common bile duct stone, bowel injury, and need for further procedures were all discussed with the patient and she was willing to proceed.  Description of Procedure: The patient was correctly identified in the preoperative area and brought into the operating room.  The patient was placed supine with VTE prophylaxis in place.  Appropriate time-outs were performed.  Anesthesia was induced and the patient was intubated.  Appropriate antibiotics were infused.  The abdomen was prepped and draped in a sterile fashion. An infraumbilical incision was made. A cutdown technique was used to enter the abdominal cavity without injury, and a Hasson trocar was inserted.  Pneumoperitoneum was obtained with appropriate opening pressures.  A 5-mm port was placed in the subxiphoid area and two 5-mm ports were placed in the right upper quadrant under direct visualization.  The gallbladder was identified.  The fundus was grasped and retracted cephalad.  Adhesions were lysed bluntly and with electrocautery. The infundibulum was grasped and retracted laterally, exposing the peritoneum overlying the gallbladder.  This was incised with electrocautery and extended on either side of the gallbladder.  The cystic duct and cystic artery were clearly identified and  bluntly dissected.  Both were clipped twice proximally and once distally, cutting in between.  The gallbladder was taken from the gallbladder fossa in a retrograde fashion with electrocautery. The gallbladder was placed in an Endocatch bag. The liver bed was inspected and any bleeding was controlled with electrocautery. The right upper quadrant was then inspected again revealing intact clips, no bleeding, and no ductal injury.  The area was thoroughly irrigated.  The 5 mm ports were removed under direct visualization and the Hasson trocar was removed.  The Endocatch bag was brought out via the umbilical incision. The fascial opening was closed using 0 vicryl suture.  Local anesthetic was infused in all incisions and the incisions were closed with 4-0 Monocryl.  The wounds were cleaned and sealed with DermaBond.  The patient was emerged from anesthesia and extubated and brought to the recovery room for further management.  The patient tolerated the procedure well and all counts were correct at the end of the case.   Howie Ill, MD

## 2018-05-17 NOTE — Brief Op Note (Signed)
05/17/2018  3:57 PM  PATIENT:  Johny Chess  22 y.o. female  PRE-OPERATIVE DIAGNOSIS:  choledocholithiasis and cholelithiasis  POST-OPERATIVE DIAGNOSIS:  same  PROCEDURE:  Procedure(s): LAPAROSCOPIC CHOLECYSTECTOMY (N/A)  SURGEON:  Surgeon(s) and Role:    * Jaevian Shean, MD - Primary  PHYSICIAN ASSISTANT:   ASSISTANTS: none   ANESTHESIA:   general  EBL:  10 ml   BLOOD ADMINISTERED:none  DRAINS: none   LOCAL MEDICATIONS USED:  BUPIVICAINE   SPECIMEN:  No Specimen and Source of Specimen:  gallbladder  DISPOSITION OF SPECIMEN:  PATHOLOGY  COUNTS:  YES  TOURNIQUET:  * No tourniquets in log *  DICTATION: .Dragon Dictation  PLAN OF CARE: transfer to floor  PATIENT DISPOSITION:  PACU - hemodynamically stable.   Delay start of Pharmacological VTE agent (>24hrs) due to surgical blood loss or risk of bleeding: yes

## 2018-05-17 NOTE — Progress Notes (Signed)
[employer] [facility] [place] [facility]   PATIENT NAME: Gina Lester    MR#:  [id number]  DATE OF BIRTH:  11/29/1995  SUBJECTIVE:   Patient is doing much better this morning.  Pain is better controlled.  She had ERCP yesterday and plan for cholecystectomy today.  No nausea, vomiting or abdominal pain is reported. REVIEW OF SYSTEMS:    Review of Systems  Constitutional: Negative for fever, chills weight loss HENT: Negative for ear pain, nosebleeds, congestion, facial swelling, rhinorrhea, neck pain, neck stiffness and ear discharge.   Respiratory: Negative for cough, shortness of breath, wheezing  Cardiovascular: Negative for chest pain, palpitations and leg swelling.  Gastrointestinal: Denies abdominal pain, nausea or vomiting Genitourinary: Negative for dysuria, urgency, frequency, hematuria Musculoskeletal: Negative for back pain or joint pain Neurological: Negative for dizziness, seizures, syncope, focal weakness,  numbness and headaches.  Hematological: Does not bruise/bleed easily.  Psychiatric/Behavioral: Negative for hallucinations, confusion, dysphoric mood    Tolerating Diet: npo      DRUG ALLERGIES:  No Known Allergies  VITALS:  Blood pressure 96/62, pulse (!) 59, temperature 98.1 F (36.7 C), resp. rate 18, height 5' (1.524 m), weight 67.8 kg, SpO2 99 %, currently breastfeeding.  PHYSICAL EXAMINATION:  Constitutional: Appears well-developed and well-nourished. No distress. HENT: Normocephalic. Marland Kitchen Oropharynx is clear and moist.  Eyes: Conjunctivae and EOM are normal. PERRLA, no scleral icterus.  Neck: Normal ROM. Neck supple. No JVD. No tracheal deviation. CVS: RRR, S1/S2 +, no murmurs, no gallops, no carotid bruit.  Pulmonary: Effort and breath sounds normal, no stridor, rhonchi, wheezes, rales.  Abdominal: Soft. BS +, no abdominal pain/tenderness no  rebound or guarding  musculoskeletal: Normal range of motion. No edema and no tenderness.   Neuro: Alert. CN 2-12 grossly intact. No focal deficits. Skin: Skin is warm and dry. No rash noted. Psychiatric: Normal mood and affect.      LABORATORY PANEL:   CBC Recent Labs  Lab 05/17/18 0739  WBC 6.8  HGB 10.8*  HCT 35.3*  PLT 252   ------------------------------------------------------------------------------------------------------------------  Chemistries  Recent Labs  Lab 05/17/18 0739  NA 140  K 3.3*  CL 108  CO2 24  GLUCOSE 90  BUN 6  CREATININE 0.49  CALCIUM 8.5*  AST 86*  ALT 227*  ALKPHOS 217*  BILITOT 0.9   ------------------------------------------------------------------------------------------------------------------  Cardiac Enzymes No results for input(s): TROPONINI in the last 168 hours. ------------------------------------------------------------------------------------------------------------------  RADIOLOGY:  Mr 3d Recon At Scanner  Result Date: 05/16/2018 CLINICAL DATA:  Right upper quadrant abdominal pain with nausea and vomiting. Five weeks postpartum. Cholelithiasis and choledocholithiasis. EXAM: MRI ABDOMEN WITHOUT AND WITH CONTRAST (INCLUDING MRCP) TECHNIQUE: Multiplanar multisequence MR imaging of the abdomen was performed both before and after the administration of intravenous contrast. Heavily T2-weighted images of the biliary and pancreatic ducts were obtained, and three-dimensional MRCP images were rendered by post processing. CONTRAST:  6.5 cc Gadavist COMPARISON:  Ultrasound from 05/14/2018 FINDINGS: Despite efforts by the technologist and patient, motion artifact is present on today's exam and could not be eliminated. This reduces exam sensitivity and specificity. Lower chest: Unremarkable Hepatobiliary: Multiple small gallstones in the 3 mm diameter range as shown on images 17-18 of series 3. Due to patient motion, the distal most CBD was excluded on the dedicated MRCP images, but there is a suggestion of a up to 3 mm in  diameter distal CBD stone on image 14/3. No biliary dilatation. No significant abnormal enhancement along the biliary tree. No significant  focal hepatic lesion or gallbladder wall thickening. No pericholecystic fluid. Pancreas:  Unremarkable Spleen:  Unremarkable Adrenals/Urinary Tract:  Unremarkable Stomach/Bowel: Unremarkable Vascular/Lymphatic:  Unremarkable Other:  No supplemental non-categorized findings. Musculoskeletal: Unremarkable IMPRESSION: 1. Cholelithiasis in addition to a suspected 3 mm distal CBD stone compatible with choledocholithiasis. No current biliary dilatation or abnormal enhancement along the biliary tree. No gallbladder wall thickening or pericholecystic fluid. No other significant abnormalities identified. Electronically Signed   By: Gaylyn Rong M.D.   On: 05/16/2018 07:10   Dg C-arm 1-60 Min-no Report  Result Date: 05/16/2018 Fluoroscopy was utilized by the requesting physician.  No radiographic interpretation.   Mr Abdomen Mrcp W Wo Contast  Result Date: 05/16/2018 CLINICAL DATA:  Right upper quadrant abdominal pain with nausea and vomiting. Five weeks postpartum. Cholelithiasis and choledocholithiasis. EXAM: MRI ABDOMEN WITHOUT AND WITH CONTRAST (INCLUDING MRCP) TECHNIQUE: Multiplanar multisequence MR imaging of the abdomen was performed both before and after the administration of intravenous contrast. Heavily T2-weighted images of the biliary and pancreatic ducts were obtained, and three-dimensional MRCP images were rendered by post processing. CONTRAST:  6.5 cc Gadavist COMPARISON:  Ultrasound from 05/14/2018 FINDINGS: Despite efforts by the technologist and patient, motion artifact is present on today's exam and could not be eliminated. This reduces exam sensitivity and specificity. Lower chest: Unremarkable Hepatobiliary: Multiple small gallstones in the 3 mm diameter range as shown on images 17-18 of series 3. Due to patient motion, the distal most CBD was excluded  on the dedicated MRCP images, but there is a suggestion of a up to 3 mm in diameter distal CBD stone on image 14/3. No biliary dilatation. No significant abnormal enhancement along the biliary tree. No significant focal hepatic lesion or gallbladder wall thickening. No pericholecystic fluid. Pancreas:  Unremarkable Spleen:  Unremarkable Adrenals/Urinary Tract:  Unremarkable Stomach/Bowel: Unremarkable Vascular/Lymphatic:  Unremarkable Other:  No supplemental non-categorized findings. Musculoskeletal: Unremarkable IMPRESSION: 1. Cholelithiasis in addition to a suspected 3 mm distal CBD stone compatible with choledocholithiasis. No current biliary dilatation or abnormal enhancement along the biliary tree. No gallbladder wall thickening or pericholecystic fluid. No other significant abnormalities identified. Electronically Signed   By: Gaylyn Rong M.D.   On: 05/16/2018 07:10     ASSESSMENT AND PLAN:   22 year old female 5 weeks postpartum who presented to the emergency room due to right upper quadrant abdominal pain.  1.  Cholelithiasis with 3 mm distal CBD stone: Postoperative day #1 biliary sphincterectomy Plan for cholecystectomy today Continue pain medications PRN Follow LFTs  Management plans discussed with the patient and she is in agreement.  CODE STATUS: full  TOTAL TIME TAKING CARE OF THIS PATIENT: 24 minutes.     POSSIBLE D/C tomorrow, DEPENDING ON CLINICAL CONDITION.   Yeva Bissette M.D on 05/17/2018 at 11:18 AM  Between 7am to 6pm - Pager - [phone] After 6pm go to [email or web address] - password [email or web address]  [facility]  Office  [phone]  CC: Primary care physician; Patient, No Pcp Per  Note: This dictation was prepared with Dragon dictation along with smaller phrase technology. Any transcriptional errors that result from this process are unintentional.

## 2018-05-17 NOTE — Progress Notes (Signed)
Since returning from PACU, patient complained of not being able to at first feel and move her legs. Feeling has returned but she is only able to move them minimally. Dr. Aleen Campi was immediately notified. An order was given for a STAT CT scan of the head. Patient is awake and talking. Also states vision is somewhat blurry bilaterally. Pupil assessment was completed, are about 2mm bilaterally and react to light briskly, but equal. Hand grips are weak bilaterally.

## 2018-05-17 NOTE — Anesthesia Post-op Follow-up Note (Signed)
Anesthesia QCDR form completed.        

## 2018-05-17 NOTE — Transfer of Care (Signed)
Immediate Anesthesia Transfer of Care Note  Patient: Gina Lester  Procedure(s) Performed: LAPAROSCOPIC CHOLECYSTECTOMY (N/A )  Patient Location: PACU  Anesthesia Type:General  Level of Consciousness: awake, alert  and oriented  Airway & Oxygen Therapy: Patient Spontanous Breathing and Patient connected to nasal cannula oxygen  Post-op Assessment: Report given to RN and Post -op Vital signs reviewed and stable  Post vital signs: Reviewed and stable  Last Vitals:  Vitals Value Taken Time  BP 118/79 05/17/2018  3:02 PM  Temp 36.3 C 05/17/2018  3:02 PM  Pulse 72 05/17/2018  3:04 PM  Resp 15 05/17/2018  3:04 PM  SpO2 100 % 05/17/2018  3:04 PM  Vitals shown include unvalidated device data.  Last Pain:  Vitals:   05/17/18 1502  TempSrc:   PainSc: 0-No pain         Complications: No apparent anesthesia complications

## 2018-05-18 ENCOUNTER — Encounter: Payer: Self-pay | Admitting: Surgery

## 2018-05-18 ENCOUNTER — Other Ambulatory Visit: Payer: Self-pay

## 2018-05-18 ENCOUNTER — Emergency Department
Admission: EM | Admit: 2018-05-18 | Discharge: 2018-05-18 | Disposition: A | Discharge status: Home / Self Care | Payer: BLUE CROSS/BLUE SHIELD | Attending: Emergency Medicine | Admitting: Emergency Medicine

## 2018-05-18 DIAGNOSIS — G8918 Other acute postprocedural pain: Secondary | ICD-10-CM | POA: Diagnosis not present

## 2018-05-18 LAB — COMPREHENSIVE METABOLIC PANEL WITH GFR
ALT: 205 U/L — ABNORMAL HIGH (ref 0–44)
ALT: 184 U/L — ABNORMAL HIGH (ref 0–44)
AST: 96 U/L — ABNORMAL HIGH (ref 15–41)
AST: 93 U/L — ABNORMAL HIGH (ref 15–41)
Albumin: 3.6 g/dL (ref 3.5–5.0)
Albumin: 3.4 g/dL — ABNORMAL LOW (ref 3.5–5.0)
Alkaline Phosphatase: 189 U/L — ABNORMAL HIGH (ref 38–126)
Alkaline Phosphatase: 165 U/L — ABNORMAL HIGH (ref 38–126)
Anion gap: 8 (ref 5–15)
Anion gap: 8 (ref 5–15)
BUN: <5 mg/dL — ABNORMAL LOW (ref 6–20)
BUN: 12 mg/dL (ref 6–20)
CO2: 25 mmol/L (ref 22–32)
CO2: 23 mmol/L (ref 22–32)
Calcium: 8.7 mg/dL — ABNORMAL LOW (ref 8.9–10.3)
Calcium: 8.4 mg/dL — ABNORMAL LOW (ref 8.9–10.3)
Chloride: 107 mmol/L (ref 98–111)
Chloride: 110 mmol/L (ref 98–111)
Creatinine, Ser: 0.32 mg/dL — ABNORMAL LOW (ref 0.44–1.00)
Creatinine, Ser: 0.56 mg/dL (ref 0.44–1.00)
GFR calc Af Amer: >60 mL/min
GFR calc Af Amer: >60 mL/min
GFR calc non Af Amer: >60 mL/min
GFR calc non Af Amer: >60 mL/min
Glucose, Bld: 108 mg/dL — ABNORMAL HIGH (ref 70–99)
Glucose, Bld: 112 mg/dL — ABNORMAL HIGH (ref 70–99)
Potassium: 3.7 mmol/L (ref 3.5–5.1)
Potassium: 3.1 mmol/L — ABNORMAL LOW (ref 3.5–5.1)
Sodium: 140 mmol/L (ref 135–145)
Sodium: 141 mmol/L (ref 135–145)
Total Bilirubin: 0.8 mg/dL (ref 0.3–1.2)
Total Bilirubin: 0.6 mg/dL (ref 0.3–1.2)
Total Protein: 7.1 g/dL (ref 6.5–8.1)
Total Protein: 6.8 g/dL (ref 6.5–8.1)

## 2018-05-18 LAB — CBC WITH DIFFERENTIAL/PLATELET
Abs Immature Granulocytes: 0.14 K/uL — ABNORMAL HIGH (ref 0.00–0.07)
Basophils Absolute: 0 K/uL (ref 0.0–0.1)
Basophils Relative: 0 %
Eosinophils Absolute: 0.2 K/uL (ref 0.0–0.5)
Eosinophils Relative: 2 %
HCT: 34.2 % — ABNORMAL LOW (ref 36.0–46.0)
Hemoglobin: 10.2 g/dL — ABNORMAL LOW (ref 12.0–15.0)
Immature Granulocytes: 2 %
Lymphocytes Relative: 38 %
Lymphs Abs: 3.6 K/uL (ref 0.7–4.0)
MCH: 22.6 pg — ABNORMAL LOW (ref 26.0–34.0)
MCHC: 29.8 g/dL — ABNORMAL LOW (ref 30.0–36.0)
MCV: 75.7 fL — ABNORMAL LOW (ref 80.0–100.0)
Monocytes Absolute: 0.6 K/uL (ref 0.1–1.0)
Monocytes Relative: 6 %
Neutro Abs: 4.9 K/uL (ref 1.7–7.7)
Neutrophils Relative %: 52 %
Platelets: 245 K/uL (ref 150–400)
RBC: 4.52 MIL/uL (ref 3.87–5.11)
RDW: 23.2 % — ABNORMAL HIGH (ref 11.5–15.5)
WBC: 9.4 K/uL (ref 4.0–10.5)
nRBC: 0 % (ref 0.0–0.2)

## 2018-05-18 LAB — LIPASE, BLOOD: Lipase: 27 U/L (ref 11–51)

## 2018-05-18 LAB — GLUCOSE, CAPILLARY: Glucose-Capillary: 103 mg/dL — ABNORMAL HIGH (ref 70–99)

## 2018-05-18 MED ORDER — HYDROCODONE-ACETAMINOPHEN 5-325 MG PO TABS: 1.0000 | ORAL_TABLET | ORAL | 0 refills | Status: DC | PRN

## 2018-05-18 MED ORDER — ONDANSETRON HCL 4 MG/2ML IJ SOLN
4.0000 mg | Freq: Once | INTRAMUSCULAR | Status: AC
  Administered 2018-05-18: 4 mg via INTRAVENOUS
  Filled 2018-05-18: qty 2

## 2018-05-18 MED ORDER — MORPHINE SULFATE (PF) 4 MG/ML IV SOLN
4.0000 mg | Freq: Once | INTRAVENOUS | Status: AC
  Administered 2018-05-18: 4 mg via INTRAVENOUS
  Filled 2018-05-18: qty 1

## 2018-05-18 MED ORDER — DOCUSATE SODIUM 100 MG PO CAPS: 100.0000 mg | ORAL_CAPSULE | Freq: Two times a day (BID) | ORAL | 0 refills | Status: DC

## 2018-05-18 MED ORDER — HYDROCODONE-ACETAMINOPHEN 5-325 MG PO TABS: 1.0000 | ORAL_TABLET | ORAL | Status: DC | PRN

## 2018-05-18 NOTE — ED Provider Notes (Signed)
 [facility] Emergency Department Provider Note  ____________________________________________  Time seen: Approximately 9:24 PM  I have reviewed the triage vital signs and the nursing notes.   HISTORY  Chief Complaint Abdominal Pain   HPI Gina Lester is a 22 y.o. female postop day 0 from choledocholithiasis status post cholecystectomy and biliary sphincterectomy who presents for evaluation of abdominal pain.  Patient was discharged at 3 PM.  She reports that her pain was well controlled.  At 6 PM she started having severe pain that is diffuse in her abdomen but worse around the periumbilical area.  The pain is sharp, constant and nonradiating.  No fever, chills, nausea, vomiting.  Patient reports taking 1 Percocet with no relief.  She also noticed a small amount of bloody discharge coming from the incision in her umbilicus.  She called her surgeon's office who told her that her pain in the discharge were normal and for her to follow-up in the clinic.  She reports that the pain got progressively worse to the point that is now severe so she called 911.  Past Medical History:  Diagnosis Date  . Constipation   . Seasonal allergies     Patient Active Problem List   Diagnosis Date Noted  . Choledocholithiasis   . Abnormal LFTs (liver function tests)   . Biliary colic 05/15/2018  . Indication for care in labor or delivery 03/24/2018  . Labor and delivery, indication for care 02/28/2018  . Maternal varicella, non-immune 05/19/2015  . Abdominal pain 04/17/2014  . Transaminitis 04/17/2014    Past Surgical History:  Procedure Laterality Date  . CHOLECYSTECTOMY N/A 05/17/2018   Procedure: LAPAROSCOPIC CHOLECYSTECTOMY;  Surgeon: Piscoya, Jose, MD;  Location: [facility] ORS;  Service: General;  Laterality: N/A;  . NO PAST SURGERIES      Prior to Admission medications   Medication Sig Start Date End Date Taking? Authorizing Provider  docusate sodium (COLACE)  100 MG capsule Take 1 capsule (100 mg total) by mouth 2 (two) times daily. 05/18/18  Yes Mody, Sital, MD  HYDROcodone-acetaminophen (NORCO/VICODIN) 5-325 MG tablet Take 1-2 tablets by mouth every 4 (four) hours as needed for moderate pain or severe pain. 05/18/18  Yes Mody, Sital, MD  Prenatal Vit-Fe Fumarate-FA (PRENATAL MULTIVITAMIN) TABS tablet Take 1 tablet by mouth daily at 12 noon.   Yes [provider]    Allergies Patient has no known allergies.  Family History  Problem Relation Age of Onset  . Gallbladder disease Mother     Social History Social History   Tobacco Use  . Smoking status: Never Smoker  . Smokeless tobacco: Never Used  Substance Use Topics  . Alcohol use: Not Currently  . Drug use: No    Review of Systems  Constitutional: Negative for fever. Eyes: Negative for visual changes. ENT: Negative for sore throat. Neck: No neck pain  Cardiovascular: Negative for chest pain. Respiratory: Negative for shortness of breath. Gastrointestinal: + abdominal pain. No vomiting or diarrhea. Genitourinary: Negative for dysuria. Musculoskeletal: Negative for back pain. Skin: Negative for rash. Neurological: Negative for headaches, weakness or numbness. Psych: No SI or HI  ____________________________________________   PHYSICAL EXAM:  VITAL SIGNS: ED Triage Vitals  Enc Vitals Group     BP 05/18/18 2109 119/70     Pulse Rate 05/18/18 2109 72     Resp --      Temp 05/18/18 2109 98.6 F (37 C)     Temp src --        SpO2 05/18/18 2109 98 %     Weight 05/18/18 2110 155 lb 6.8 oz (70.5 kg)     Height 05/18/18 2110 5' (1.524 m)     Head Circumference --      Peak Flow --      Pain Score 05/18/18 2109 10     Pain Loc --      Pain Edu? --      Excl. in GC? --     Constitutional: Alert and oriented, crying and holding her abdomen.  HEENT:      Head: Normocephalic and atraumatic.         Eyes: Conjunctivae are normal. Sclera is non-icteric.        Mouth/Throat: Mucous membranes are moist.       Neck: Supple with no signs of meningismus. Cardiovascular: Regular rate and rhythm. No murmurs, gallops, or rubs. 2+ symmetrical distal pulses are present in all extremities. No JVD. Respiratory: Normal respiratory effort. Lungs are clear to auscultation bilaterally. No wheezes, crackles, or rhonchi.  Gastrointestinal: Soft, well healing surgical incisions with no erythema, mild amount of bloody discharge on the umbilicus, no dehiscence noted, diffuse ttp with no localizing tenderness Musculoskeletal: Nontender with normal range of motion in all extremities. No edema, cyanosis, or erythema of extremities. Neurologic: Normal speech and language. Face is symmetric. Moving all extremities. No gross focal neurologic deficits are appreciated. Skin: Skin is warm, dry and intact. No rash noted. Psychiatric: Mood and affect are normal. Speech and behavior are normal.  ____________________________________________   LABS (all labs ordered are listed, but only abnormal results are displayed)  Labs Reviewed  CBC WITH DIFFERENTIAL/PLATELET - Abnormal; Notable for the following components:      Result Value   Hemoglobin 10.2 (*)    HCT 34.2 (*)    MCV 75.7 (*)    MCH 22.6 (*)    MCHC 29.8 (*)    RDW 23.2 (*)    Abs Immature Granulocytes 0.14 (*)    All other components within normal limits  COMPREHENSIVE METABOLIC PANEL - Abnormal; Notable for the following components:   Potassium 3.1 (*)    Glucose, Bld 112 (*)    Calcium 8.4 (*)    Albumin 3.4 (*)    AST 93 (*)    ALT 184 (*)    Alkaline Phosphatase 165 (*)    All other components within normal limits  LIPASE, BLOOD   ____________________________________________  EKG  none ____________________________________________  RADIOLOGY  none  ____________________________________________   PROCEDURES  Procedure(s) performed: None Procedures Critical Care performed:   None ____________________________________________   INITIAL IMPRESSION / ASSESSMENT AND PLAN / ED COURSE   22 y.o. female postop day 0 from choledocholithiasis status post cholecystectomy and biliary sphincterectomy who presents for evaluation of abdominal pain.  Patient is crying holding her abdomen, vitals are within normal limits with no fever, surgical incisions look well healing with no evidence of infection, there is mild amount of bloody discharge from the bellybutton with no dehiscence.  Patient has diffuse tenderness on palpation which is expected on postop day 0.  She still has not had a bowel movement.  Will check basic labs and give her a dose of IV morphine for pain  Clinical Course as of May 19 2247  Sun May 18, 2018  2247 Patient's pain is not well controlled.  Labs with no leukocytosis, LFTs and alk phos are trending down.  Will discharge home with close follow-up with her surgeon tomorrow.  Discussed   strict return precautions   [CV]    Clinical Course User Index [CV] , Hagerman, MD     As part of my medical decision making, I reviewed the following data within the electronic medical record:  Nursing notes reviewed and incorporated, Labs reviewed , Old chart reviewed, Notes from prior ED visits and [place] Controlled Substance Database    Pertinent labs & imaging results that were available during my care of the patient were reviewed by me and considered in my medical decision making (see chart for details).    ____________________________________________   FINAL CLINICAL IMPRESSION(S) / ED DIAGNOSES  Final diagnoses:  Post-operative pain      NEW MEDICATIONS STARTED DURING THIS VISIT:  ED Discharge Orders    None       Note:  This document was prepared using Dragon voice recognition software and may include unintentional dictation errors.    , [place], MD 05/18/18 2248  

## 2018-05-18 NOTE — Discharge Instructions (Addendum)
Follow-up with your surgeon tomorrow if you are still having abdominal pain.  Continue to take the stool softener.  Take 1 Percocet every 4 hours for pain.  Return to the emergency room for worsening pain or fever.

## 2018-05-18 NOTE — Plan of Care (Signed)
Nutrition Education Note  RD consulted for nutrition education regarding a diet s/p cholecystectomy.  22 year old female with no significant PMHx admitted with choledocholithiasis now s/p laparoscopic cholecystectomy on 10/26. Diet was advanced to clear liquids yesterday evening and regular this morning.  Met with patient and her family members at bedside. Patient reports she is feeling well today. She denies any abdominal pain or N/V and reports she is tolerating her diet well. She is finishing about 90% of her regular diet. She reports she had a good appetite and intake PTA.   RD provided "Fat-Restricted Nutrition Therapy" handout from the Academy of [employer]. Discussed what the gallbladder is and the role is plays in digestion. Discussed that most patients can resume a fairly regular diet following cholecystectomy. Encouraged patient to limit high-fat, greasy, or fried foods in diet as this can lead to diarrhea without a gallbladder. Discussed increasing soluble fiber intake slowly to help prevent diarrhea. Encouraged patient to eat 4-6 small, frequent meals and snacks throughout the day if 3 regular-sized meals are not tolerated. Encouraged intake of a protein food at each meal and snack. Teach-back method used.  Expect good compliance  Body mass index is 30.37 kg/m. Pt meets criteria for Obesity Class I based on current BMI.   Current diet order is Regular, patient is consuming approximately 90% of meals at this time. Labs and medications reviewed. No further nutrition interventions warranted at this time. RD contact information provided. If additional nutrition issues arise, please re-consult RD.  Willey Blade, MS, [place], LDN Office: [phone] Pager: [phone] After Hours/Weekend Pager: [phone]

## 2018-05-18 NOTE — Discharge Summary (Signed)
[employer] [facility] -  at [facility]   PATIENT NAME: Gina Lester    MR#:  [id number]  DATE OF BIRTH:  [birth year]  DATE OF ADMISSION:  05/14/2018 ADMITTING PHYSICIAN: Cammy Copa, MD  DATE OF DISCHARGE: May 18, 2018  PRIMARY CARE PHYSICIAN: Patient, No Pcp Per    ADMISSION DIAGNOSIS:  Choledocholithiasis [K80.50] Right upper quadrant abdominal pain [R10.11]  DISCHARGE DIAGNOSIS:  Active Problems:   Biliary colic   Choledocholithiasis   Abnormal LFTs (liver function tests)   SECONDARY DIAGNOSIS:   Past Medical History:  Diagnosis Date  . Constipation   . Seasonal allergies     HOSPITAL COURSE:   22 year old female 5 weeks postpartum who presented to the emergency room due to right upper quadrant abdominal pain.  1.  Cholelithiasis with 3 mm distal CBD stone: She is postoperative day #2 biliary sphincterectomy and POD #1 cholecystectomy. Patient will be discharged home today with pain medications and follow-up with surgery in 1 week. Plan for cholecystectomy today Continue pain medications PRN Follow LFTs    DISCHARGE CONDITIONS AND DIET:   Stable for discharge on regular diet  CONSULTS OBTAINED:  Treatment Team:  Henrene Dodge, MD  DRUG ALLERGIES:  No Known Allergies  DISCHARGE MEDICATIONS:   Allergies as of 05/18/2018   No Known Allergies     Medication List    STOP taking these medications   ibuprofen 600 MG tablet Commonly known as:  ADVIL,MOTRIN     TAKE these medications   docusate sodium 100 MG capsule Commonly known as:  COLACE Take 1 capsule (100 mg total) by mouth 2 (two) times daily.   HYDROcodone-acetaminophen 5-325 MG tablet Commonly known as:  NORCO/VICODIN Take 1-2 tablets by mouth every 4 (four) hours as needed for moderate pain or severe pain.   prenatal multivitamin Tabs tablet Take 1 tablet by mouth daily at 12 noon.         Today   CHIEF COMPLAINT:  Patient is ready to be  discharged today   VITAL SIGNS:  Blood pressure 96/65, pulse (!) 55, temperature 98.2 F (36.8 C), temperature source Oral, resp. rate 16, height 5' (1.524 m), weight 70.5 kg, SpO2 97 %, currently breastfeeding.   REVIEW OF SYSTEMS:  Review of Systems  Constitutional: Negative.  Negative for chills, fever and malaise/fatigue.  HENT: Negative.  Negative for ear discharge, ear pain, hearing loss, nosebleeds and sore throat.   Eyes: Negative.  Negative for blurred vision and pain.  Respiratory: Negative.  Negative for cough, hemoptysis, shortness of breath and wheezing.   Cardiovascular: Negative.  Negative for chest pain, palpitations and leg swelling.  Gastrointestinal: Positive for abdominal pain. Negative for blood in stool, diarrhea, nausea and vomiting.  Genitourinary: Negative.  Negative for dysuria.  Musculoskeletal: Negative.  Negative for back pain.  Skin: Negative.   Neurological: Negative for dizziness, tremors, speech change, focal weakness, seizures and headaches.  Endo/Heme/Allergies: Negative.  Does not bruise/bleed easily.  Psychiatric/Behavioral: Negative.  Negative for depression, hallucinations and suicidal ideas.     PHYSICAL EXAMINATION:  GENERAL:  22 y.o.-year-old patient lying in the bed with no acute distress.  NECK:  Supple, no jugular venous distention. No thyroid enlargement, no tenderness.  LUNGS: Normal breath sounds bilaterally, no wheezing, rales,rhonchi  No use of accessory muscles of respiration.  CARDIOVASCULAR: S1, S2 normal. No murmurs, rubs, or gallops.  ABDOMEN: Soft, non-tender, non-distended. Bowel sounds present. No organomegaly or mass.  EXTREMITIES: No pedal edema, cyanosis, or clubbing.  PSYCHIATRIC:  The patient is alert and oriented x 3.  SKIN: No obvious rash, lesion, or ulcer.   DATA REVIEW:   CBC Recent Labs  Lab 05/17/18 0739  WBC 6.8  HGB 10.8*  HCT 35.3*  PLT 252    Chemistries  Recent Labs  Lab 05/18/18 0414  NA 140   K 3.7  CL 107  CO2 25  GLUCOSE 108*  BUN <5*  CREATININE 0.32*  CALCIUM 8.7*  AST 96*  ALT 205*  ALKPHOS 189*  BILITOT 0.8    Cardiac Enzymes No results for input(s): TROPONINI in the last 168 hours.  Microbiology Results  @MICRORSLT48 @  RADIOLOGY:  Ct Head Wo Contrast  Result Date: 05/17/2018 CLINICAL DATA:  Lower extremity paresis following laparoscopic cholecystectomy. EXAM: CT HEAD WITHOUT CONTRAST TECHNIQUE: Contiguous axial images were obtained from the base of the skull through the vertex without intravenous contrast. COMPARISON:  Head CT 06/17/2016 FINDINGS: Brain: There is no mass, hemorrhage or extra-axial collection. The size and configuration of the ventricles and extra-axial CSF spaces are normal. There is no acute or chronic infarction. The brain parenchyma is normal. Vascular: No abnormal hyperdensity of the major intracranial arteries or dural venous sinuses. No intracranial atherosclerosis. Skull: The visualized skull base, calvarium and extracranial soft tissues are normal. Sinuses/Orbits: Partial sphenoid sinus opacification. No mastoid effusion. The orbits are normal. IMPRESSION: Normal brain. Electronically Signed   By: Deatra Robinson M.D.   On: 05/17/2018 17:49   Dg C-arm 1-60 Min-no Report  Result Date: 05/16/2018 Fluoroscopy was utilized by the requesting physician.  No radiographic interpretation.      Allergies as of 05/18/2018   No Known Allergies     Medication List    STOP taking these medications   ibuprofen 600 MG tablet Commonly known as:  ADVIL,MOTRIN     TAKE these medications   docusate sodium 100 MG capsule Commonly known as:  COLACE Take 1 capsule (100 mg total) by mouth 2 (two) times daily.   HYDROcodone-acetaminophen 5-325 MG tablet Commonly known as:  NORCO/VICODIN Take 1-2 tablets by mouth every 4 (four) hours as needed for moderate pain or severe pain.   prenatal multivitamin Tabs tablet Take 1 tablet by mouth daily at  12 noon.          Management plans discussed with the patient and she is in agreement. Stable for discharge home  Patient should follow up with surgery  CODE STATUS:     Code Status Orders  (From admission, onward)         Start     Ordered   05/15/18 0752  Full code  Continuous     05/15/18 0751        Code Status History    Date Active Date Inactive Code Status Order ID Comments User Context   04/08/2018 1246 04/09/2018 1714 Full Code [id number]  Doreene Burke, CNM Inpatient   04/08/2018 0743 04/08/2018 1246 Full Code [id number]  Doreene Burke, CNM Inpatient   12/11/2015 2302 12/13/2015 1506 Full Code [id number]  Purcell Nails, CNM Inpatient   12/10/2015 2211 12/11/2015 2302 Full Code [id number]  Purcell Nails, CNM Inpatient   10/16/2015 0544 10/16/2015 1134 Full Code [id number]  Byrd Hesselbach, RN Inpatient   09/07/2015 1827 09/08/2015 1231 Full Code [id number]  Camelia Phenes, RN Inpatient   04/17/2014 0200 04/20/2014 1724 Full Code [id number]  Munns, Jackalyn Lombard, MD Inpatient      TOTAL TIME TAKING CARE OF THIS PATIENT: 39 minutes.  Note: This dictation was prepared with Dragon dictation along with smaller phrase technology. Any transcriptional errors that result from this process are unintentional.  Meigan Pates M.D on 05/18/2018 at 10:11 AM  Between 7am to 6pm - Pager - [phone] After 6pm go to [email or web address] - password [email or web address]  [facility]  Office  [phone]  CC: Primary care physician; Patient, No Pcp Per

## 2018-05-18 NOTE — Discharge Instructions (Signed)
1.  Patient may shower, but do not scrub wounds heavily and dab dry only. 2.  Do not submerge wounds in pool/tub for 1 week. 3.  Do not apply ointments or hydrogen peroxide to the wounds. 4.  No dressing is necessary. 5.  Do not drive while taking narcotics for pain control. 6.  No heavy lifting or pushing of more than 10-15 lbs for 4 weeks.

## 2018-05-18 NOTE — ED Triage Notes (Signed)
Per EMS pt had gall bladder surgery today and was discharged at 3pm. Pt states pain at started at 6pm and she took her medication and called the office. They told her it was normal. Pt felt it hurt to much and was bleeding so she called 911. VSS. Temp WNL.Small amt serosanguinous fluid noted at umbilicus incision. Drt dressing applied

## 2018-05-19 ENCOUNTER — Telehealth: Payer: Self-pay | Admitting: Certified Nurse Midwife

## 2018-05-19 LAB — NASOPHARYNGEAL CULTURE: CULTURE: NORMAL

## 2018-05-19 NOTE — Telephone Encounter (Signed)
Pt lvm to schedule ppv apt, I called pt back to schedule apt, vm box is not  Set up yet. Thank you.

## 2018-05-19 NOTE — Telephone Encounter (Signed)
Patient has appointment scheduled on 05/20/2018 at 11am.

## 2018-05-20 ENCOUNTER — Encounter: Payer: Self-pay | Admitting: Gastroenterology

## 2018-05-20 ENCOUNTER — Ambulatory Visit (INDEPENDENT_AMBULATORY_CARE_PROVIDER_SITE_OTHER): Payer: BLUE CROSS/BLUE SHIELD | Admitting: Certified Nurse Midwife

## 2018-05-20 LAB — SURGICAL PATHOLOGY

## 2018-05-20 MED ORDER — DROSPIRENONE 4 MG PO TABS: 1.0000 | ORAL_TABLET | Freq: Every day | ORAL | 3 refills | Status: DC

## 2018-05-20 NOTE — Addendum Note (Signed)
Addendum  created 05/20/18 1454 by Stormy Fabian, CRNA   Charge Capture section accepted

## 2018-05-20 NOTE — Progress Notes (Signed)
Subjective:    Brentley Horrell is a 22 y.o. [id number] Hispanic female who presents for a postpartum visit. She is 6 weeks postpartum following a spontaneous vaginal delivery at 395 gestational weeks. Anesthesia: none. I have fully reviewed the prenatal and intrapartum course. Postpartum course has been complicated by cholecystectomy  . Baby's course has been WNL. Baby is feeding by breast and bottle. Bleeding no bleeding. Bowel function is normal. Bladder function is normal. Patient is not sexually active. Last sexual activity: prior to birth. Contraception method is none. Postpartum depression screening: negative. Score 3.  Last pap : N/A   The following portions of the patient's history were reviewed and updated as appropriate: allergies, current medications, past medical history, past surgical history and problem list.  Review of Systems Pertinent items are noted in HPI.   Vitals:   05/20/18 1047  BP: 96/68  Pulse: 63  Weight: 153 lb (69.4 kg)  Height: 5' (1.524 m)   No LMP recorded (lmp unknown).  Objective:   General:  alert, cooperative and no distress   Breasts:  deferred, no complaints  Lungs: clear to auscultation bilaterally  Heart:  regular rate and rhythm  Abdomen: soft, nontender   Vulva: normal  Vagina: normal vagina  Cervix:  closed  Corpus: Well-involuted  Adnexa:  Non-palpable  Rectal Exam: no hemorrhoids        Assessment:   Postpartum exam 6 wks s/p SVD Breat & bottle feeding Depression screening Contraception counseling   Plan:  : oral progesterone-only contraceptive starting. Samples of Slynd given. Follow up in: 1 yr  for annual exam or earlier if needed  Doreene Burke, CNM

## 2018-05-20 NOTE — Patient Instructions (Signed)

## 2018-05-22 ENCOUNTER — Telehealth: Payer: Self-pay | Admitting: *Deleted

## 2018-05-22 NOTE — Telephone Encounter (Signed)
Patient called and wanted to know if she can get another refill on hydrocodone, she stated that she is still in pain.

## 2018-05-22 NOTE — Telephone Encounter (Signed)
May try tylenol or ibuprofen as well or recommend taking 2 Aleve twice a day. The patient is aware to use a heating pad as needed for comfort, pt agrees. Follow up as scheduled next week.

## 2018-05-23 ENCOUNTER — Telehealth: Payer: Self-pay

## 2018-05-23 NOTE — Telephone Encounter (Signed)
EMMI Follow-up: Received a call from Ms. Ronnald Nian as she had just received a call from my number.  I explained our process of two automated calls and this was the 1st call and we were checking to see if she got her Rx's filled, if she was aware of follow-up appointments and to see how she was doing post discharge.  Said she was doing okay but she still having some pain and her Rx was soon to run out.  She had already called the surgeron's office to get a refill and they advised her to get some Aleve.  She was aware of follow-up appointments and no other needs noted.

## 2018-05-27 ENCOUNTER — Ambulatory Visit: Payer: BLUE CROSS/BLUE SHIELD | Admitting: Surgery

## 2018-06-17 ENCOUNTER — Ambulatory Visit: Payer: BLUE CROSS/BLUE SHIELD | Admitting: Gastroenterology

## 2018-06-17 ENCOUNTER — Encounter: Payer: Self-pay | Admitting: Gastroenterology
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
## 2018-12-30 ENCOUNTER — Emergency Department
Admission: EM | Admit: 2018-12-30 | Discharge: 2018-12-30 | Disposition: A | Discharge status: Home / Self Care | Payer: No Typology Code available for payment source | Attending: Emergency Medicine | Admitting: Emergency Medicine

## 2018-12-30 ENCOUNTER — Encounter: Payer: Self-pay | Admitting: Medical Oncology

## 2018-12-30 ENCOUNTER — Other Ambulatory Visit: Payer: Self-pay

## 2018-12-30 ENCOUNTER — Emergency Department: Payer: No Typology Code available for payment source

## 2018-12-30 DIAGNOSIS — Z79899 Other long term (current) drug therapy: Secondary | ICD-10-CM | POA: Diagnosis not present

## 2018-12-30 DIAGNOSIS — S161XXA Strain of muscle, fascia and tendon at neck level, initial encounter: Secondary | ICD-10-CM | POA: Insufficient documentation

## 2018-12-30 DIAGNOSIS — Y9389 Activity, other specified: Secondary | ICD-10-CM | POA: Diagnosis not present

## 2018-12-30 DIAGNOSIS — Y998 Other external cause status: Secondary | ICD-10-CM | POA: Diagnosis not present

## 2018-12-30 DIAGNOSIS — S60211A Contusion of right wrist, initial encounter: Secondary | ICD-10-CM | POA: Insufficient documentation

## 2018-12-30 DIAGNOSIS — Z9049 Acquired absence of other specified parts of digestive tract: Secondary | ICD-10-CM | POA: Insufficient documentation

## 2018-12-30 DIAGNOSIS — S6991XA Unspecified injury of right wrist, hand and finger(s), initial encounter: Secondary | ICD-10-CM | POA: Diagnosis present

## 2018-12-30 DIAGNOSIS — Y929 Unspecified place or not applicable: Secondary | ICD-10-CM | POA: Insufficient documentation

## 2018-12-30 MED ORDER — METHOCARBAMOL 500 MG PO TABS: 500.0000 mg | ORAL_TABLET | Freq: Four times a day (QID) | ORAL | 0 refills | Status: DC | PRN

## 2018-12-30 MED ORDER — NAPROXEN 500 MG PO TABS: 500.0000 mg | ORAL_TABLET | Freq: Two times a day (BID) | ORAL | 0 refills | Status: DC
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
## 2018-12-30 NOTE — Discharge Instructions (Signed)
Follow-up with your primary care provider or can no clinic acute care if any continued problems.  Ice and elevate your wrist as needed for pain or swelling.  Wear the cock-up wrist splint for added support.  Begin taking medication as directed.  The methocarbamol is the muscle relaxant and should not be taken while you are driving as it may cause drowsiness.  Naproxen is twice a day with food for inflammation and pain.  Do not breast-feed while you are taking these medications.  You may also place ice or heat to your neck as needed for discomfort.

## 2019-02-18 ENCOUNTER — Other Ambulatory Visit: Payer: Self-pay

## 2019-02-18 ENCOUNTER — Ambulatory Visit (INDEPENDENT_AMBULATORY_CARE_PROVIDER_SITE_OTHER): Payer: Medicaid Other | Admitting: Certified Nurse Midwife

## 2019-02-18 ENCOUNTER — Encounter: Payer: Self-pay | Admitting: Certified Nurse Midwife

## 2019-02-18 VITALS — BP 96/66 | HR 74 | Ht 60.0 in | Wt 166.8 lb

## 2019-02-18 DIAGNOSIS — Z1329 Encounter for screening for other suspected endocrine disorder: Secondary | ICD-10-CM

## 2019-02-18 DIAGNOSIS — N912 Amenorrhea, unspecified: Secondary | ICD-10-CM

## 2019-02-18 NOTE — Progress Notes (Signed)
Patient c/o no menses x3 months, multiple negative home pregnancy tests.

## 2019-02-18 NOTE — Progress Notes (Signed)
PT not seen. Labs ordered. Will follow up with lab results. Plan of provera challenge test with negative lab results.   Philip Aspen, CNM

## 2019-02-18 NOTE — Patient Instructions (Signed)

## 2019-02-19 LAB — AFP TUMOR MARKER: AFP, Serum, Tumor Marker: <0.9 ng/mL (ref 0.0–8.3)

## 2019-02-19 LAB — FOLLICLE STIMULATING HORMONE: FSH: 8.9 m[IU]/mL

## 2019-02-19 LAB — TSH: TSH: 0.96 u[IU]/mL (ref 0.450–4.500)

## 2019-02-19 LAB — LUTEINIZING HORMONE: LH: 13.5 m[IU]/mL

## 2019-02-20 ENCOUNTER — Telehealth: Payer: Self-pay

## 2019-02-20 NOTE — Telephone Encounter (Signed)
Called [facility] to add quantitative hcg per AT VO.  Per Guillermina [place] at [place], test has been added.

## 2019-02-23 ENCOUNTER — Other Ambulatory Visit: Payer: Self-pay | Admitting: Certified Nurse Midwife

## 2019-02-23 LAB — SPECIMEN STATUS REPORT

## 2019-02-23 LAB — BETA HCG QUANT (REF LAB): hCG Quant: <1 m[IU]/mL

## 2019-02-23 MED ORDER — MEDROXYPROGESTERONE ACETATE 10 MG PO TABS: 10.0000 mg | ORAL_TABLET | Freq: Every day | ORAL | 0 refills | Status: DC

## 2019-02-23 NOTE — Progress Notes (Signed)
No period in a few months. Lab WNL. Provera challenge ordered.   Philip Aspen, CNM

## 2019-09-05 IMAGING — MR MR ABDOMEN WO/W CM MRCP
21 of 23 series · 44 of 48 positions shown · IV contrast (Gadavist)
Comparison: Ultrasound from 05/14/2018

CLINICAL DATA: Right upper quadrant abdominal pain with nausea and
vomiting. Five weeks postpartum. Cholelithiasis and
choledocholithiasis.

EXAM:
MRI ABDOMEN WITHOUT AND WITH CONTRAST (INCLUDING MRCP)
TECHNIQUE: Multiplanar multisequence MR imaging of the abdomen was performed
both before and after the administration of intravenous contrast.
Heavily T2-weighted images of the biliary and pancreatic ducts were
obtained, and three-dimensional MRCP images were rendered by post
processing.
CONTRAST:  6.5 cc Gadavist

[Series 3: bSSFP · coronal · 6.0mm · 0.74mm/px · 2 of 30 slices shown]
[im 1/30]
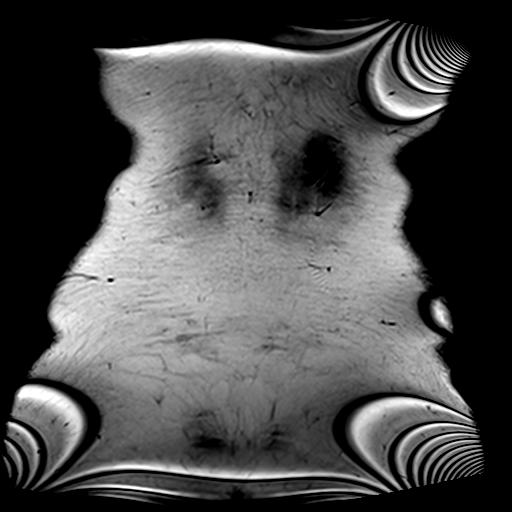
[im 30/30]
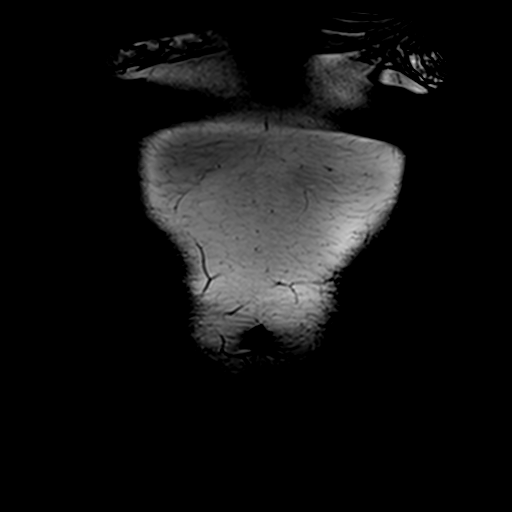

[Series 4: T2 · axial · 6.0mm · 1.19mm/px · z∈[-9,+200]mm · 2 of 30 slices shown]
[im 1/30]
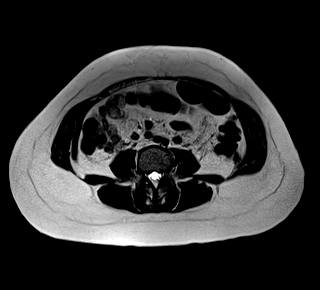
[im 30/30]
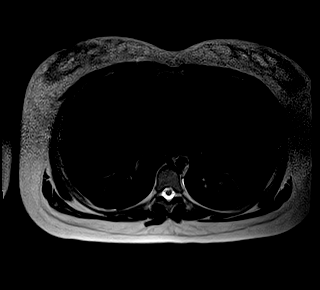

[Series 5: T1 · axial · 6.0mm · 0.74mm/px · z∈[-9,+200]mm · 2 of 30 slices shown (1 of 2)]
[im 1/30]
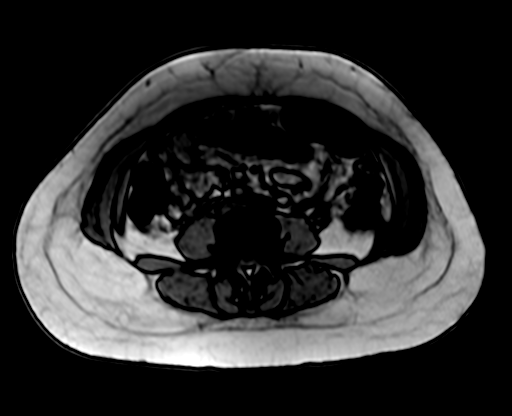
[im 30/30]
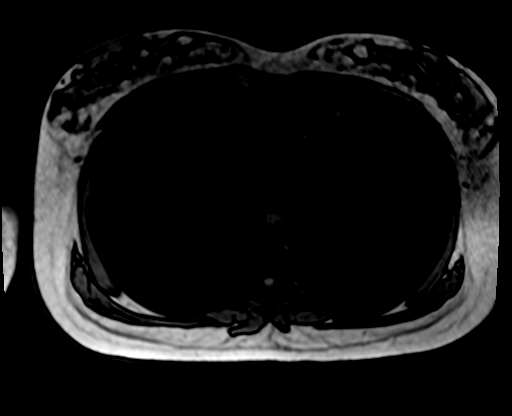

[Series 5: T1 · axial · 6.0mm · 0.74mm/px · 1 of 30 slices shown (2 of 2)]
[im 1/30]
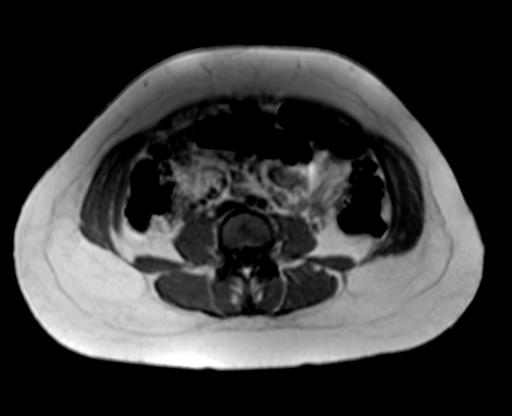

[Series 8: T2 fat-sat · axial · 6.0mm · 1.19mm/px · 1 of 30 slices shown]
[im 1/30]
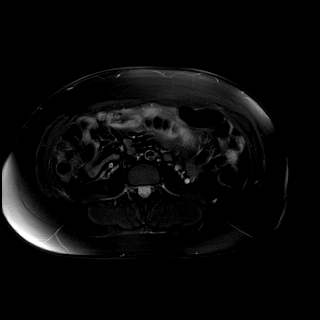

[Series 13: ax dwi_tracew · axial · 6.0mm · 1.42mm/px · 1 of 30 slices shown (1 of 3)]
[im 1/30]
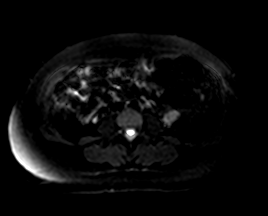

[Series 13: ax dwi_tracew · axial · 6.0mm · 1.42mm/px · 1 of 30 slices shown (2 of 3)]
[im 1/30]
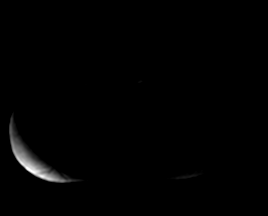

[Series 13: ax dwi_tracew · axial · 6.0mm · 1.42mm/px · 1 of 30 slices shown (3 of 3)]
[im 1/30]
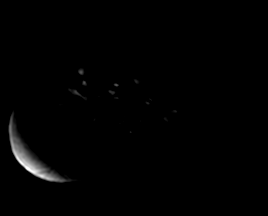

[Series 14: ax dwi_adc · axial · 6.0mm · 1.42mm/px · 1 of 30 slices shown]
[im 1/30]
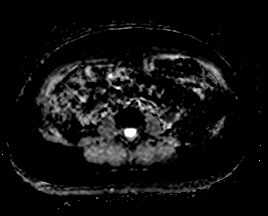

[Series 15: MRCP · coronal · 3.0mm · 1.12mm/px · 1 of 20 slices shown]
[im 1/20]
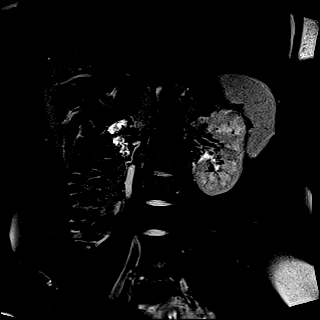

[Series 16: radials · coronal · 50.0mm · 0.78mm/px · 1 of 5 slices shown]
[im 1/5]
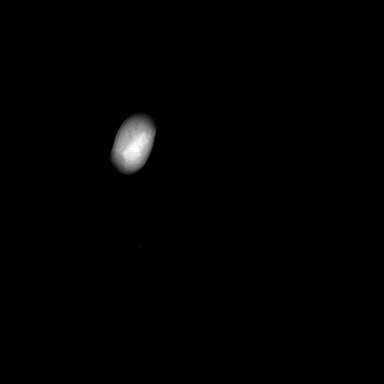

[Series 17: T1 dynamic fat-sat · axial · non-contrast · 3.0mm · 1.19mm/px · z∈[-6,+207]mm · 3 of 72 slices shown (1 of 5)]
[im 1/72]
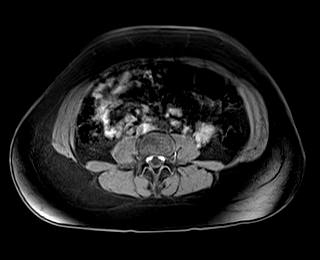
[im 36/72]
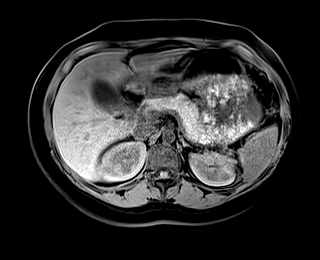
[im 72/72]
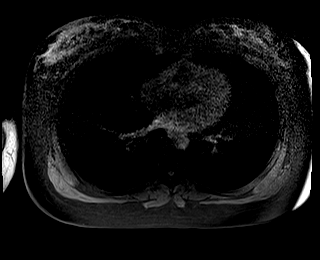

[Series 18: T1 dynamic fat-sat post-contrast · axial · 3.0mm · 1.19mm/px · z∈[-6,+207]mm · 3 of 72 slices shown (1 of 4)]
[im 1/72]
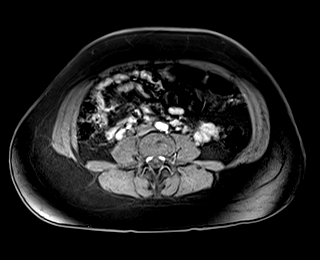
[im 36/72]
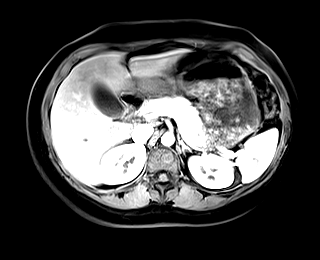
[im 72/72]
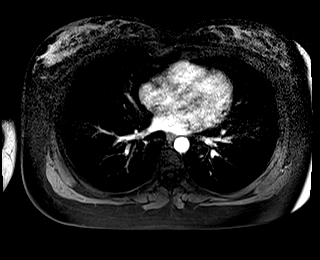

[Series 19: T1 dynamic fat-sat · axial · 3.0mm · 1.19mm/px · z∈[-6,+207]mm · 3 of 72 slices shown (2 of 5)]
[im 1/72]
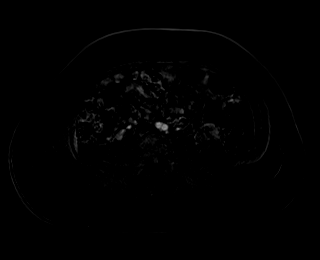
[im 36/72]
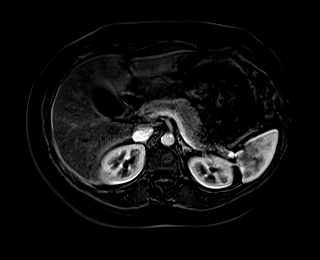
[im 72/72]
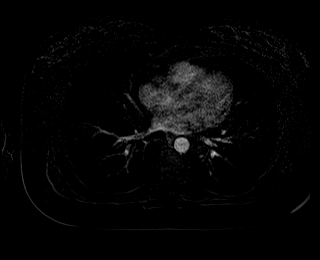

[Series 20: T1 dynamic fat-sat post-contrast · axial · 3.0mm · 1.19mm/px · z∈[-6,+207]mm · 3 of 72 slices shown (2 of 4)]
[im 1/72]
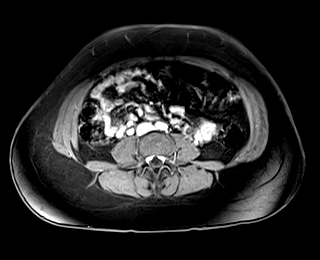
[im 36/72]
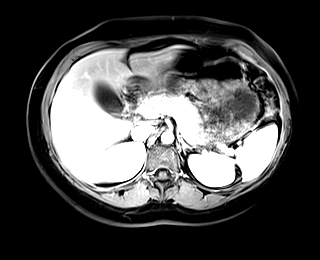
[im 72/72]
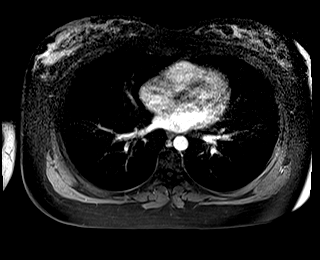

[Series 21: T1 dynamic fat-sat · axial · 3.0mm · 1.19mm/px · z∈[-6,+207]mm · 3 of 72 slices shown (3 of 5)]
[im 1/72]
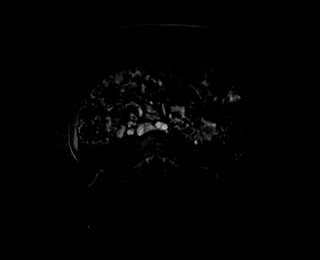
[im 36/72]
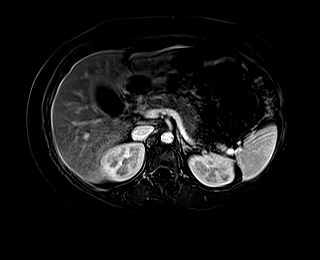
[im 72/72]
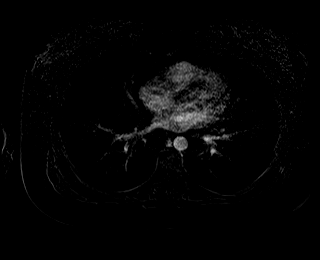

[Series 22: T1 dynamic fat-sat post-contrast · axial · 3.0mm · 1.19mm/px · z∈[-6,+207]mm · 3 of 72 slices shown (3 of 4)]
[im 1/72]
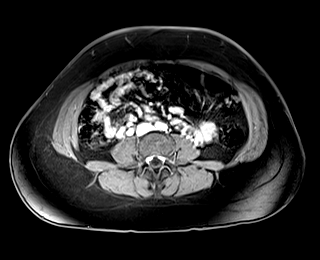
[im 36/72]
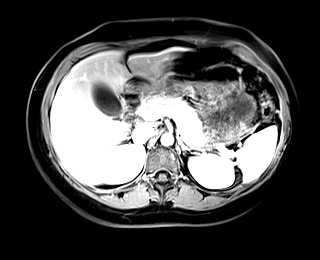
[im 72/72]
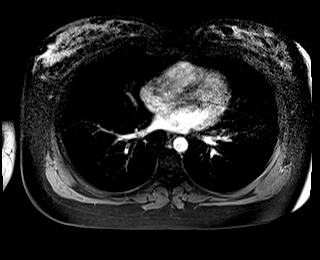

[Series 23: T1 dynamic fat-sat · axial · 3.0mm · 1.19mm/px · z∈[-6,+207]mm · 3 of 72 slices shown (4 of 5)]
[im 1/72]
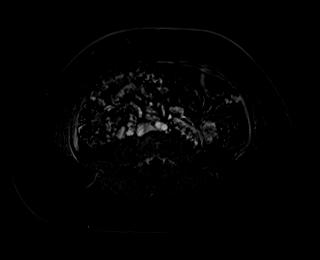
[im 36/72]
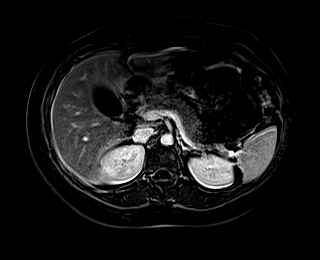
[im 72/72]
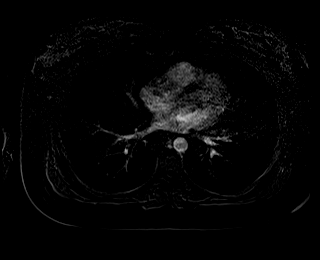

[Series 24: T1 dynamic post-contrast · coronal · 3.0mm · 1.19mm/px · 3 of 72 slices shown]
[im 1/72]
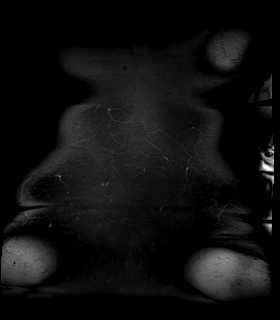
[im 36/72]
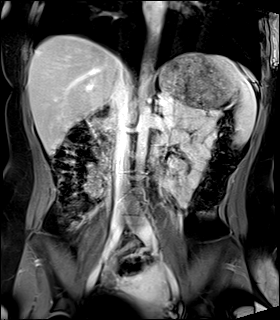
[im 72/72]
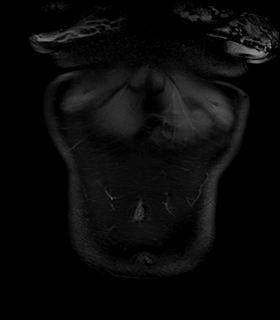

[Series 25: T1 dynamic fat-sat post-contrast · axial · 3.0mm · 1.19mm/px · z∈[-6,+207]mm · 3 of 72 slices shown (4 of 4)]
[im 1/72]
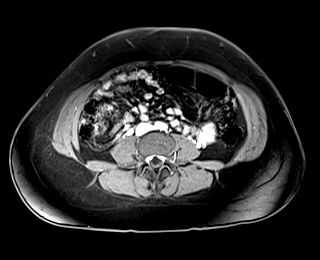
[im 36/72]
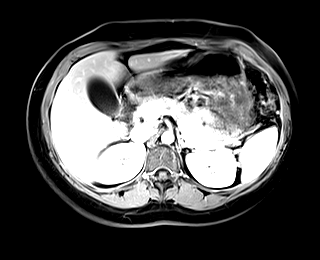
[im 72/72]
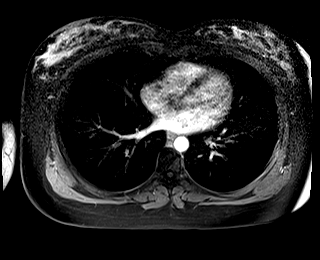

[Series 26: T1 dynamic fat-sat · axial · 3.0mm · 1.19mm/px · z∈[-6,+207]mm · 3 of 72 slices shown (5 of 5)]
[im 1/72]
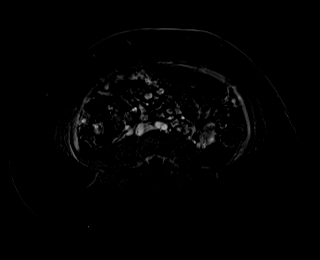
[im 36/72]
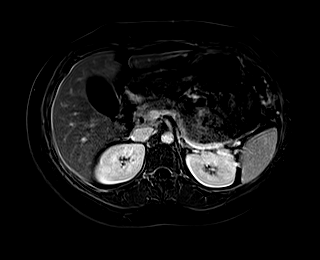
[im 72/72]
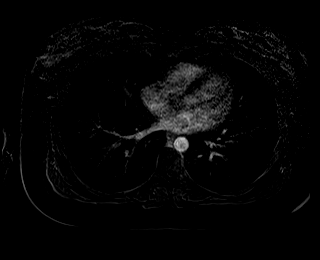

[44 of 48 positions shown; findings below may reference images not displayed]

FINDINGS: Despite efforts by the technologist and patient, motion artifact is
present on today's exam and could not be eliminated. This reduces
exam sensitivity and specificity.

Lower chest: Unremarkable

Hepatobiliary: Multiple small gallstones in the 3 mm diameter range
as shown on images 17-18 of series 3. Due to patient motion, the
distal most CBD was excluded on the dedicated MRCP images, but there
is a suggestion of a up to 3 mm in diameter distal CBD stone on
image [DATE]. No biliary dilatation. No significant abnormal
enhancement along the biliary tree. No significant focal hepatic
lesion or gallbladder wall thickening. No pericholecystic fluid.

Pancreas:  Unremarkable

Spleen:  Unremarkable

Adrenals/Urinary Tract:  Unremarkable

Stomach/Bowel: Unremarkable

Vascular/Lymphatic:  Unremarkable

Other:  No supplemental non-categorized findings.

Musculoskeletal: Unremarkable
IMPRESSION: 1. Cholelithiasis in addition to a suspected 3 mm distal CBD stone
compatible with choledocholithiasis. No current biliary dilatation
or abnormal enhancement along the biliary tree. No gallbladder wall
thickening or pericholecystic fluid. No other significant
abnormalities identified.

## 2019-12-23 ENCOUNTER — Ambulatory Visit
Admission: RE | Admit: 2019-12-23 | Discharge: 2019-12-23 | Disposition: A | Discharge status: Home / Self Care | Payer: BC Managed Care – PPO | Source: Ambulatory Visit | Attending: Physician Assistant | Admitting: Physician Assistant

## 2019-12-23 ENCOUNTER — Encounter: Payer: Self-pay | Admitting: Physician Assistant

## 2019-12-23 ENCOUNTER — Other Ambulatory Visit (HOSPITAL_COMMUNITY): Payer: Self-pay | Admitting: Physician Assistant

## 2019-12-23 ENCOUNTER — Emergency Department
Admission: EM | Admit: 2019-12-23 | Discharge: 2019-12-23 | Disposition: A | Discharge status: Home / Self Care | Payer: BC Managed Care – PPO | Attending: Emergency Medicine | Admitting: Emergency Medicine

## 2019-12-23 ENCOUNTER — Other Ambulatory Visit: Payer: Self-pay

## 2019-12-23 ENCOUNTER — Other Ambulatory Visit: Payer: Self-pay | Admitting: Physician Assistant

## 2019-12-23 ENCOUNTER — Encounter: Payer: Self-pay | Admitting: Emergency Medicine

## 2019-12-23 DIAGNOSIS — R112 Nausea with vomiting, unspecified: Secondary | ICD-10-CM

## 2019-12-23 DIAGNOSIS — Z5321 Procedure and treatment not carried out due to patient leaving prior to being seen by health care provider: Secondary | ICD-10-CM | POA: Diagnosis not present

## 2019-12-23 DIAGNOSIS — R1110 Vomiting, unspecified: Secondary | ICD-10-CM | POA: Diagnosis not present

## 2019-12-23 DIAGNOSIS — R1011 Right upper quadrant pain: Secondary | ICD-10-CM | POA: Insufficient documentation

## 2019-12-23 LAB — COMPREHENSIVE METABOLIC PANEL
ALT: 22 U/L (ref 0–44)
AST: 15 U/L (ref 15–41)
Albumin: 4.3 g/dL (ref 3.5–5.0)
Alkaline Phosphatase: 85 U/L (ref 38–126)
Anion gap: 8 (ref 5–15)
BUN: 14 mg/dL (ref 6–20)
CO2: 26 mmol/L (ref 22–32)
Calcium: 9 mg/dL (ref 8.9–10.3)
Chloride: 104 mmol/L (ref 98–111)
Creatinine, Ser: 0.65 mg/dL (ref 0.44–1.00)
GFR calc Af Amer: >60 mL/min (ref >60)
GFR calc non Af Amer: >60 mL/min (ref >60)
Glucose, Bld: 93 mg/dL (ref 70–99)
Potassium: 4.1 mmol/L (ref 3.5–5.1)
Sodium: 138 mmol/L (ref 135–145)
Total Bilirubin: 0.7 mg/dL (ref 0.3–1.2)
Total Protein: 8.2 g/dL — ABNORMAL HIGH (ref 6.5–8.1)

## 2019-12-23 LAB — URINALYSIS, COMPLETE (UACMP) WITH MICROSCOPIC
Bacteria, UA: NONE SEEN
Bilirubin Urine: NEGATIVE
Glucose, UA: NEGATIVE mg/dL
Hgb urine dipstick: NEGATIVE
Ketones, ur: NEGATIVE mg/dL
Leukocytes,Ua: NEGATIVE
Nitrite: NEGATIVE
Protein, ur: NEGATIVE mg/dL
Specific Gravity, Urine: 1.028 (ref 1.005–1.030)
pH: 5 (ref 5.0–8.0)

## 2019-12-23 LAB — LIPASE, BLOOD: Lipase: 21 U/L (ref 11–51)

## 2019-12-23 LAB — CBC
HCT: 43.3 % (ref 36.0–46.0)
Hemoglobin: 14.8 g/dL (ref 12.0–15.0)
MCH: 28.2 pg (ref 26.0–34.0)
MCHC: 34.2 g/dL (ref 30.0–36.0)
MCV: 82.6 fL (ref 80.0–100.0)
Platelets: 283 10*3/uL (ref 150–400)
RBC: 5.24 MIL/uL — ABNORMAL HIGH (ref 3.87–5.11)
RDW: 13.5 % (ref 11.5–15.5)
WBC: 9.7 10*3/uL (ref 4.0–10.5)
nRBC: 0 % (ref 0.0–0.2)

## 2019-12-23 LAB — POCT PREGNANCY, URINE: Preg Test, Ur: NEGATIVE

## 2019-12-23 NOTE — ED Triage Notes (Signed)
Pt reports abd pai to ruq and vomitting for the last 3 days. Pt denies fevers or diarrhea

## 2019-12-25 ENCOUNTER — Ambulatory Visit: Payer: BC Managed Care – PPO

## 2020-05-30 ENCOUNTER — Ambulatory Visit (INDEPENDENT_AMBULATORY_CARE_PROVIDER_SITE_OTHER): Payer: BLUE CROSS/BLUE SHIELD | Admitting: Certified Nurse Midwife

## 2020-05-30 ENCOUNTER — Other Ambulatory Visit: Payer: Self-pay

## 2020-05-30 ENCOUNTER — Encounter: Payer: Self-pay | Admitting: Certified Nurse Midwife

## 2020-05-30 VITALS — BP 107/62 | HR 62 | Ht 60.0 in | Wt 158.2 lb

## 2020-05-30 DIAGNOSIS — N926 Irregular menstruation, unspecified: Secondary | ICD-10-CM | POA: Diagnosis not present

## 2020-05-30 LAB — POCT URINE PREGNANCY: Preg Test, Ur: POSITIVE — AB

## 2020-05-30 NOTE — Patient Instructions (Signed)
Prostaglandin-Induced Abortion  Prostaglandin-induced abortion is a procedure that uses prostaglandins to end a pregnancy. Prostaglandins are hormone-like medicines used to start labor by making the uterus tighten (contract). Prostaglandin-induced abortion can be done during the first trimester of pregnancy. Tell a health care provider about:  Any allergies you have.  All medicines you are taking, including vitamins, herbs, eye drops, creams, and over-the-counter medicines.  Any problems you or family members have had with anesthetic medicines.  Any blood disorders you have.  Any surgeries you have had.  Any medical conditions you have. What are the risks? Generally, this is a safe procedure. However, problems may occur, including:  Parts of the placenta and other afterbirth remaining in your uterus. If this happens, you may need to have a procedure where the inside of your uterus is scraped to remove it (dilation and curettage, or D&C).  Heavy bleeding.  Painful cramping that is not relieved by over-the-counter medicines.  Infection.  Failure to end the pregnancy. If this happens, you may need to have a dilation and curettage. What happens before the procedure?  You may have an exam of your outer and inner genitals and reproductive organs (pelvic exam).  You may have a blood test to confirm that you are pregnant.  You may have an ultrasound of your uterus.  After discussing the procedure with your health care provider, you will be asked to sign a consent form.  Ask your health care provider about changing or stopping your regular medicines. This is especially important if you are taking diabetes medicines or blood thinners. What happens during the procedure?  You will take prostaglandin in tablet form by mouth. After you take the medicine, you will be able to go home.  The medicine will induce bleeding that may last for a few hours or up to a few days, similar to a heavy  menstrual period.  Within 1-3 days of taking this medicine, you will be instructed to take more medicine by mouth or by putting it directly into your vagina (suppository). Follow your health care provider's instructions carefully. The procedure may vary among health care providers and hospitals. What happens after the procedure?  You may have cramps for a few days. They may feel like menstrual cramps.  You will continue to have bleeding that may last up to a few days, similar to a heavy menstrual period.  After about 2 weeks, or when you have stopped bleeding, you may have an ultrasound to confirm that you are no longer pregnant. Summary  Prostaglandin-induced abortion is a procedure that uses prostaglandins to end a pregnancy.  This is a safe procedure. However, problems may occur including failure to remove all the placenta and afterbirth and failure to end the pregnancy. Other problems include cramping, bleeding, and infection.  You will take a prostaglandin in tablet form, by mouth. After you take the medicine, you will be able to go home.  After about 2 weeks, or when you have stopped bleeding, you may have an ultrasound to confirm that you are no longer pregnant. This information is not intended to replace advice given to you by your health care provider. Make sure you discuss any questions you have with your health care provider. Document Revised: 06/21/2017 Document Reviewed: 09/26/2016 Elsevier Patient Education  2020 Elsevier Inc.  

## 2020-05-30 NOTE — Progress Notes (Signed)
GYN ENCOUNTER NOTE  Subjective:       Gina Lester is a 24 y.o. [phone] female here for pregnancy confirmation.   Patient is uncertain regarding pregnancy and wishes to discuss termination options including associated risks and benefits of each.   Endorses nausea without vomiting. Denies difficulty breathing or respiratory distress, chest pain, abdominal pain, vaginal bleeding, dysuria, and leg pain or swelling.    Gynecologic History  Patient's last menstrual period was 03/26/2020.  Gestational age: 63 weeks 2 days  Estimated date of birth: [birth year]  Last Pap: due  Obstetric History  OB History  Gravida Para Term Preterm AB Living  2 2 2     2   SAB TAB Ectopic Multiple Live Births        0 2    # Outcome Date GA Lbr Len/2nd Weight Sex Delivery Anes PTL Lv  2 Term 04/08/18 [redacted]w[redacted]d / 00:02 7 lb 8.6 oz (3.42 kg) M Vag-Spont None  LIV  1 Term 12/11/15 [redacted]w[redacted]d / 00:33 7 lb 7.2 oz (3.38 kg) M Vag-Spont None  LIV    Past Medical History:  Diagnosis Date  . Constipation   . Seasonal allergies     Past Surgical History:  Procedure Laterality Date  . CHOLECYSTECTOMY N/A 05/17/2018   Procedure: LAPAROSCOPIC CHOLECYSTECTOMY;  Surgeon: 05/19/2018, MD;  Location: [facility] ORS;  Service: General;  Laterality: N/A;  . ENDOSCOPIC RETROGRADE CHOLANGIOPANCREATOGRAPHY (ERCP) WITH PROPOFOL N/A 05/16/2018   Procedure: ENDOSCOPIC RETROGRADE CHOLANGIOPANCREATOGRAPHY (ERCP) WITH PROPOFOL;  Surgeon: 05/18/2018, MD;  Location: [facility] ENDOSCOPY;  Service: Endoscopy;  Laterality: N/A;    No current outpatient medications on file prior to visit.   No current facility-administered medications on file prior to visit.    No Known Allergies  Social History   Socioeconomic History  . Marital status: Single    Spouse name: Not on file  . Number of children: Not on file  . Years of education: Not on file  . Highest education level: Not on file  Occupational History  . Occupation:  [employer]  Tobacco Use  . Smoking status: Never Smoker  . Smokeless tobacco: Never Used  Vaping Use  . Vaping Use: Never used  Substance and Sexual Activity  . Alcohol use: Not Currently  . Drug use: No  . Sexual activity: Not Currently    Birth control/protection: None  Other Topics Concern  . Not on file  Social History Narrative   Social Determinants of Health   Financial Resource Strain:   . Difficulty of Paying Living Expenses: Not on file  Food Insecurity:   . Worried About Consulting civil engineer in the Last Year: Not on file  . Ran Out of Food in the Last Year: Not on file  Transportation Needs:   . Lack of Transportation (Medical): Not on file  . Lack of Transportation (Non-Medical): Not on file  Physical Activity:   . Days of Exercise per Week: Not on file  . Minutes of Exercise per Session: Not on file  Stress:   . Feeling of Stress : Not on file  Social Connections:   . Frequency of Communication with Friends and Family: Not on file  . Frequency of Social Gatherings with Friends and Family: Not on file  . Attends Religious Services: Not on file  . Active Member of Clubs or Organizations: Not on file  . Attends Programme researcher, broadcasting/film/video Meetings: Not on file  . Marital Status: Not on file  Intimate Partner Violence:   .  Fear of Current or Ex-Partner: Not on file  . Emotionally Abused: Not on file  . Physically Abused: Not on file  . Sexually Abused: Not on file    Family History  Problem Relation Age of Onset  . Gallbladder disease Mother   . Breast cancer Neg Hx   . Ovarian cancer Neg Hx   . Colon cancer Neg Hx     The following portions of the patient's history were reviewed and updated as appropriate: allergies, current medications, past family history, past medical history, past social history, past surgical history and problem list.  Review of Systems  ROS negative except as noted above. Information obtained from patient.   Objective:   BP 107/62    Pulse 62   Ht 5' (1.524 m)   Wt 158 lb 3.2 oz (71.8 kg)   LMP 03/26/2020   BMI 30.90 kg/m    CONSTITUTIONAL: Well-developed, well-nourished female in no acute distress.   PHYSICAL EXAM: Not indicated.   Recent Results (from the past 2160 hour(s))  POCT urine pregnancy     Status: Abnormal   Collection Time: 05/30/20  9:20 AM  Result Value Ref Range   Preg Test, Ur Positive (A) Negative     Assessment:   1. Missed menses  - POCT urine pregnancy     Plan:   Discussed pregnancy termination options including [name] laws; handouts provided.   Contact information given for [facility] [place] and [facility].   Reviewed red flag symptoms and when to call.   RTC as needed.    Serafina Royals, CNM [facility], [facility] 05/30/20 10:25 AM

## 2020-05-30 NOTE — Progress Notes (Signed)
Pt present for to discuss pregnancy termination options. UPT Positive.

## 2020-07-14 ENCOUNTER — Other Ambulatory Visit: Payer: Self-pay

## 2020-07-14 ENCOUNTER — Encounter: Payer: Self-pay | Admitting: Intensive Care

## 2020-07-14 ENCOUNTER — Emergency Department
Admission: EM | Admit: 2020-07-14 | Discharge: 2020-07-14 | Disposition: A | Discharge status: Home / Self Care | Payer: Medicaid Other | Attending: Emergency Medicine | Admitting: Emergency Medicine

## 2020-07-14 DIAGNOSIS — H1033 Unspecified acute conjunctivitis, bilateral: Secondary | ICD-10-CM | POA: Diagnosis not present

## 2020-07-14 DIAGNOSIS — H5713 Ocular pain, bilateral: Secondary | ICD-10-CM | POA: Diagnosis present

## 2020-07-14 MED ORDER — TOBRAMYCIN 0.3 % OP SOLN
2.0000 [drp] | OPHTHALMIC | 0 refills | Status: DC
Start: 2020-07-14 — End: 2022-03-05

## 2020-07-14 NOTE — ED Triage Notes (Signed)
Pt c/o bilateral eye pain X1 week. Reports she saw eye doctor yesterday and they gave her eye drops that helped but then again this morning she can barely open eyes.

## 2020-07-14 NOTE — Discharge Instructions (Addendum)
Follow-up with your regular eye doctor if not improving in 2 to 3 days.  Return emergency department worsening.  Or you can follow-up with [facility] by calling and make an appointment.  Tell them you are seen in the ER. Use the eyedrops as prescribed.  Wash your hands every time you touch her eyes as it is very contagious.

## 2020-07-14 NOTE — ED Provider Notes (Signed)
[facility] Emergency Department Provider Note  ____________________________________________   Event Date/Time   First MD Initiated Contact with Patient 07/14/20 1431     (approximate)  I have reviewed the triage vital signs and the nursing notes.   HISTORY  Chief Complaint Eye Pain    HPI Gina Lester is a 24 y.o. female presents emergency department complaint bilateral eye pain for approximately 1 week.  Patient saw her regular eye doctor at [facility] vision who put a drop in her eye while she was there and told her it would get better.  She has not had drops since then.  She was given no prescription.  She states today the eyes were so crusty and matted she could not open them.  No fever or chills.    Past Medical History:  Diagnosis Date  . Constipation   . Seasonal allergies     Patient Active Problem List   Diagnosis Date Noted  . Choledocholithiasis   . Abnormal LFTs (liver function tests)   . Biliary colic 05/15/2018  . Abdominal pain 04/17/2014  . Transaminitis 04/17/2014    Past Surgical History:  Procedure Laterality Date  . CHOLECYSTECTOMY N/A 05/17/2018   Procedure: LAPAROSCOPIC CHOLECYSTECTOMY;  Surgeon: Henrene Dodge, MD;  Location: [facility] ORS;  Service: General;  Laterality: N/A;  . ENDOSCOPIC RETROGRADE CHOLANGIOPANCREATOGRAPHY (ERCP) WITH PROPOFOL N/A 05/16/2018   Procedure: ENDOSCOPIC RETROGRADE CHOLANGIOPANCREATOGRAPHY (ERCP) WITH PROPOFOL;  Surgeon: Midge Minium, MD;  Location: [facility] ENDOSCOPY;  Service: Endoscopy;  Laterality: N/A;  . NO PAST SURGERIES      Prior to Admission medications   Medication Sig Start Date End Date Taking? Authorizing Provider  tobramycin (TOBREX) 0.3 % ophthalmic solution Place 2 drops into both eyes every 4 (four) hours. 07/14/20   Faythe Ghee, PA-C    Allergies Patient has no known allergies.  Family History  Problem Relation Age of Onset  . Gallbladder disease Mother   . Breast  cancer Neg Hx   . Ovarian cancer Neg Hx   . Colon cancer Neg Hx     Social History Social History   Tobacco Use  . Smoking status: Never Smoker  . Smokeless tobacco: Never Used  Vaping Use  . Vaping Use: Never used  Substance Use Topics  . Alcohol use: Not Currently  . Drug use: No    Review of Systems  Constitutional: No fever/chills Eyes: No visual changes.  Positive eye matting and pain ENT: No sore throat. Respiratory: Denies cough Cardiovascular: Denies chest pain Gastrointestinal: Denies abdominal pain Genitourinary: Negative for dysuria. Musculoskeletal: Negative for back pain. Skin: Negative for rash. Psychiatric: no mood changes,     ____________________________________________   PHYSICAL EXAM:  VITAL SIGNS: ED Triage Vitals  Enc Vitals Group     BP 07/14/20 1310 100/69     Pulse Rate 07/14/20 1310 66     Resp 07/14/20 1310 16     Temp 07/14/20 1310 98.5 F (36.9 C)     Temp Source 07/14/20 1310 Oral     SpO2 07/14/20 1310 97 %     Weight 07/14/20 1311 153 lb (69.4 kg)     Height 07/14/20 1311 5' (1.524 m)     Head Circumference --      Peak Flow --      Pain Score 07/14/20 1311 8     Pain Loc --      Pain Edu? --      Excl. in GC? --  Constitutional: Alert and oriented. Well appearing and in no acute distress. Eyes: Conjunctivae are injected bilaterally, no exudate noted at this time, no foreign body Head: Atraumatic. Nose: No congestion/rhinnorhea. Mouth/Throat: Mucous membranes are moist.   Neck:  supple no lymphadenopathy noted Cardiovascular: Normal rate, regular rhythm. Heart sounds are normal Respiratory: Normal respiratory effort.  No retractions, lungs c t a  GU: deferred Musculoskeletal: FROM all extremities, warm and well perfused Neurologic:  Normal speech and language.  Skin:  Skin is warm, dry and intact. No rash noted. Psychiatric: Mood and affect are normal. Speech and behavior are  normal.  ____________________________________________   LABS (all labs ordered are listed, but only abnormal results are displayed)  Labs Reviewed - No data to display ____________________________________________   ____________________________________________  RADIOLOGY    ____________________________________________   PROCEDURES  Procedure(s) performed: No  Procedures    ____________________________________________   INITIAL IMPRESSION / ASSESSMENT AND PLAN / ED COURSE  Pertinent labs & imaging results that were available during my care of the patient were reviewed by me and considered in my medical decision making (see chart for details).   The patient is a 24 year old female presents with bilateral eye pain and matting.  See HPI.  Physical exam is consistent with junk to Vitas.  Patient was given a prescription for tobramycin ophthalmic drops.  Follow-up with her eye doctor or [facility] if not improving in 2 to 3 days.  Return emergency department worsening.  States she understands.  Is discharged stable condition.     Gina Lester was evaluated in Emergency Department on 07/14/2020 for the symptoms described in the history of present illness. She was evaluated in the context of the global COVID-19 pandemic, which necessitated consideration that the patient might be at risk for infection with the SARS-CoV-2 virus that causes COVID-19. Institutional protocols and algorithms that pertain to the evaluation of patients at risk for COVID-19 are in a state of rapid change based on information released by regulatory bodies including the CDC and federal and state organizations. These policies and algorithms were followed during the patient's care in the ED.    As part of my medical decision making, I reviewed the following data within the electronic MEDICAL RECORD NUMBER Nursing notes reviewed and incorporated, Old chart reviewed, Notes from prior ED visits and [place]  Controlled Substance Database  ____________________________________________   FINAL CLINICAL IMPRESSION(S) / ED DIAGNOSES  Final diagnoses:  Acute bacterial conjunctivitis of both eyes      NEW MEDICATIONS STARTED DURING THIS VISIT:  New Prescriptions   TOBRAMYCIN (TOBREX) 0.3 % OPHTHALMIC SOLUTION    Place 2 drops into both eyes every 4 (four) hours.     Note:  This document was prepared using Dragon voice recognition software and may include unintentional dictation errors.    Faythe Ghee, PA-C 07/14/20 1450    Shaune Pollack, MD 07/15/20 2251

## 2020-08-28 ENCOUNTER — Emergency Department
Admission: EM | Admit: 2020-08-28 | Discharge: 2020-08-28 | Discharge status: Against Medical Advice | Payer: Medicaid Other

## 2020-08-28 NOTE — ED Notes (Signed)
No answer in lobby for triage x2

## 2021-06-07 ENCOUNTER — Other Ambulatory Visit: Payer: Self-pay

## 2021-06-07 ENCOUNTER — Emergency Department
Admission: EM | Admit: 2021-06-07 | Discharge: 2021-06-07 | Disposition: A | Discharge status: Home / Self Care | Payer: Medicaid Other | Attending: Emergency Medicine | Admitting: Emergency Medicine

## 2021-06-07 DIAGNOSIS — R109 Unspecified abdominal pain: Secondary | ICD-10-CM | POA: Insufficient documentation

## 2021-06-07 DIAGNOSIS — Z5321 Procedure and treatment not carried out due to patient leaving prior to being seen by health care provider: Secondary | ICD-10-CM | POA: Diagnosis not present

## 2021-06-07 DIAGNOSIS — R112 Nausea with vomiting, unspecified: Secondary | ICD-10-CM | POA: Insufficient documentation

## 2021-06-07 LAB — COMPREHENSIVE METABOLIC PANEL
ALT: 23 U/L (ref 0–44)
AST: 23 U/L (ref 15–41)
Albumin: 4.3 g/dL (ref 3.5–5.0)
Alkaline Phosphatase: 86 U/L (ref 38–126)
Anion gap: 10 (ref 5–15)
BUN: 9 mg/dL (ref 6–20)
CO2: 22 mmol/L (ref 22–32)
Calcium: 8.8 mg/dL — ABNORMAL LOW (ref 8.9–10.3)
Chloride: 105 mmol/L (ref 98–111)
Creatinine, Ser: 0.42 mg/dL — ABNORMAL LOW (ref 0.44–1.00)
GFR, Estimated: >60 mL/min (ref >60)
Glucose, Bld: 100 mg/dL — ABNORMAL HIGH (ref 70–99)
Potassium: 3.2 mmol/L — ABNORMAL LOW (ref 3.5–5.1)
Sodium: 137 mmol/L (ref 135–145)
Total Bilirubin: 1.2 mg/dL (ref 0.3–1.2)
Total Protein: 9 g/dL — ABNORMAL HIGH (ref 6.5–8.1)

## 2021-06-07 LAB — URINALYSIS, ROUTINE W REFLEX MICROSCOPIC
Bacteria, UA: NONE SEEN
Glucose, UA: NEGATIVE mg/dL
Hgb urine dipstick: NEGATIVE
Ketones, ur: 80 mg/dL — AB
Leukocytes,Ua: NEGATIVE
Nitrite: NEGATIVE
Protein, ur: 100 mg/dL — AB
Specific Gravity, Urine: 1.035 — ABNORMAL HIGH (ref 1.005–1.030)
pH: 8 (ref 5.0–8.0)

## 2021-06-07 LAB — CBC
HCT: 43.3 % (ref 36.0–46.0)
Hemoglobin: 14.7 g/dL (ref 12.0–15.0)
MCH: 29.6 pg (ref 26.0–34.0)
MCHC: 33.9 g/dL (ref 30.0–36.0)
MCV: 87.3 fL (ref 80.0–100.0)
Platelets: 257 10*3/uL (ref 150–400)
RBC: 4.96 MIL/uL (ref 3.87–5.11)
RDW: 13.1 % (ref 11.5–15.5)
WBC: 10.2 10*3/uL (ref 4.0–10.5)
nRBC: 0 % (ref 0.0–0.2)

## 2021-06-07 LAB — LIPASE, BLOOD: Lipase: 25 U/L (ref 11–51)

## 2021-06-07 NOTE — ED Triage Notes (Signed)
Pt comes with c/o abdominal pain that started today. Pt states N/V

## 2021-06-07 NOTE — ED Notes (Signed)
No answer when called several times from lobby; no answer when phone # listed in chart called 

## 2021-06-07 NOTE — ED Notes (Signed)
No answer when called several times from lobby 

## 2022-02-20 DIAGNOSIS — R87612 Low grade squamous intraepithelial lesion on cytologic smear of cervix (LGSIL): Secondary | ICD-10-CM

## 2022-02-20 HISTORY — DX: Low grade squamous intraepithelial lesion on cytologic smear of cervix (LGSIL): R87.612

## 2022-03-05 ENCOUNTER — Ambulatory Visit (INDEPENDENT_AMBULATORY_CARE_PROVIDER_SITE_OTHER): Payer: Medicaid Other | Admitting: Certified Nurse Midwife

## 2022-03-05 VITALS — BP 90/61 | HR 74 | Ht 60.0 in | Wt 144.4 lb

## 2022-03-05 DIAGNOSIS — Z3200 Encounter for pregnancy test, result unknown: Secondary | ICD-10-CM | POA: Diagnosis not present

## 2022-03-05 LAB — POCT URINE PREGNANCY: Preg Test, Ur: POSITIVE — AB

## 2022-03-05 NOTE — Progress Notes (Signed)
Patient presents today for pregnancy confirmation. UPT resulted in positive. LMP 01/23/22. [redacted]w[redacted]d. [id number]. She has been advised to schedule a NOB Nurse Interview at checkout.  Patient states no other questions or concerns.

## 2022-03-09 ENCOUNTER — Other Ambulatory Visit: Payer: Self-pay

## 2022-03-09 ENCOUNTER — Emergency Department
Admission: EM | Admit: 2022-03-09 | Discharge: 2022-03-09 | Disposition: A | Discharge status: Home / Self Care | Payer: Medicaid Other | Attending: Emergency Medicine | Admitting: Emergency Medicine

## 2022-03-09 ENCOUNTER — Emergency Department: Payer: Medicaid Other

## 2022-03-09 ENCOUNTER — Encounter: Payer: Self-pay | Admitting: Emergency Medicine

## 2022-03-09 DIAGNOSIS — Z3A01 Less than 8 weeks gestation of pregnancy: Secondary | ICD-10-CM | POA: Diagnosis not present

## 2022-03-09 DIAGNOSIS — R1030 Lower abdominal pain, unspecified: Secondary | ICD-10-CM | POA: Insufficient documentation

## 2022-03-09 DIAGNOSIS — O26891 Other specified pregnancy related conditions, first trimester: Secondary | ICD-10-CM | POA: Insufficient documentation

## 2022-03-09 DIAGNOSIS — O219 Vomiting of pregnancy, unspecified: Secondary | ICD-10-CM | POA: Diagnosis not present

## 2022-03-09 DIAGNOSIS — R112 Nausea with vomiting, unspecified: Secondary | ICD-10-CM

## 2022-03-09 LAB — URINALYSIS, ROUTINE W REFLEX MICROSCOPIC
Bacteria, UA: NONE SEEN
Bilirubin Urine: NEGATIVE
Glucose, UA: NEGATIVE mg/dL
Ketones, ur: 80 mg/dL — AB
Nitrite: NEGATIVE
Protein, ur: NEGATIVE mg/dL
Specific Gravity, Urine: 1.026 (ref 1.005–1.030)
pH: 5 (ref 5.0–8.0)

## 2022-03-09 LAB — CBC WITH DIFFERENTIAL/PLATELET
Abs Immature Granulocytes: 0.09 10*3/uL — ABNORMAL HIGH (ref 0.00–0.07)
Basophils Absolute: 0 10*3/uL (ref 0.0–0.1)
Basophils Relative: 0 %
Eosinophils Absolute: 0.2 10*3/uL (ref 0.0–0.5)
Eosinophils Relative: 2 %
HCT: 44.7 % (ref 36.0–46.0)
Hemoglobin: 15.1 g/dL — ABNORMAL HIGH (ref 12.0–15.0)
Immature Granulocytes: 1 %
Lymphocytes Relative: 25 %
Lymphs Abs: 1.8 10*3/uL (ref 0.7–4.0)
MCH: 28.4 pg (ref 26.0–34.0)
MCHC: 33.8 g/dL (ref 30.0–36.0)
MCV: 84 fL (ref 80.0–100.0)
Monocytes Absolute: 0.4 10*3/uL (ref 0.1–1.0)
Monocytes Relative: 5 %
Neutro Abs: 5 10*3/uL (ref 1.7–7.7)
Neutrophils Relative %: 67 %
Platelets: 258 10*3/uL (ref 150–400)
RBC: 5.32 MIL/uL — ABNORMAL HIGH (ref 3.87–5.11)
RDW: 13.5 % (ref 11.5–15.5)
WBC: 7.5 10*3/uL (ref 4.0–10.5)
nRBC: 0 % (ref 0.0–0.2)

## 2022-03-09 LAB — HCG, QUANTITATIVE, PREGNANCY: hCG, Beta Chain, Quant, S: 36810 m[IU]/mL — ABNORMAL HIGH (ref <5)

## 2022-03-09 LAB — COMPREHENSIVE METABOLIC PANEL
ALT: 19 U/L (ref 0–44)
AST: 20 U/L (ref 15–41)
Albumin: 4.5 g/dL (ref 3.5–5.0)
Alkaline Phosphatase: 60 U/L (ref 38–126)
Anion gap: 9 (ref 5–15)
BUN: 8 mg/dL (ref 6–20)
CO2: 21 mmol/L — ABNORMAL LOW (ref 22–32)
Calcium: 9.4 mg/dL (ref 8.9–10.3)
Chloride: 107 mmol/L (ref 98–111)
Creatinine, Ser: 0.66 mg/dL (ref 0.44–1.00)
GFR, Estimated: >60 mL/min (ref >60)
Glucose, Bld: 92 mg/dL (ref 70–99)
Potassium: 3.7 mmol/L (ref 3.5–5.1)
Sodium: 137 mmol/L (ref 135–145)
Total Bilirubin: 1 mg/dL (ref 0.3–1.2)
Total Protein: 8.4 g/dL — ABNORMAL HIGH (ref 6.5–8.1)

## 2022-03-09 LAB — LIPASE, BLOOD: Lipase: 24 U/L (ref 11–51)

## 2022-03-09 MED ORDER — SODIUM CHLORIDE 0.9 % IV BOLUS
1000.0000 mL | Freq: Once | INTRAVENOUS | Status: AC
  Administered 2022-03-09: 1000 mL via INTRAVENOUS

## 2022-03-09 MED ORDER — METOCLOPRAMIDE HCL 5 MG/ML IJ SOLN
10.0000 mg | Freq: Once | INTRAMUSCULAR | Status: AC
  Administered 2022-03-09: 10 mg via INTRAVENOUS
  Filled 2022-03-09: qty 2

## 2022-03-09 MED ORDER — METOCLOPRAMIDE HCL 10 MG PO TABS: 10.0000 mg | ORAL_TABLET | Freq: Three times a day (TID) | ORAL | 0 refills | Status: DC | PRN

## 2022-03-09 NOTE — ED Triage Notes (Addendum)
Pt here with N/V and abd pain since yesterday. Pt denies bleeding, pt states pain comes and goes and hurts more when she is vomiting. Pt states while she is nauseous her left arm gets numb. Pt is currently [redacted] weeks pregnant.

## 2022-03-09 NOTE — ED Notes (Signed)
Take ice chips at present

## 2022-03-09 NOTE — Discharge Instructions (Addendum)
You may use the Reglan as needed for nausea.  Make sure to drink small amounts of fluid at a time to stay hydrated.  Follow-up with your OB/GYN at [facility].  Return to the ER for new, worsening, or persistent severe vomiting especially if it is not relieved by the Reglan, recurrent or worsening abdominal pain, vaginal bleeding, or any other new or worsening symptoms that are concerning.

## 2022-03-09 NOTE — ED Triage Notes (Signed)
First nurse note  Pt observed walking into the er and stopped holding onto the side of the building, at my introduction to the pt outside with a wheelchair pt is tearful stating that she is having lower abd pain and vaginal pain, states that she is [redacted] weeks pregnant, denies vaginal bleeding and reports that she has nausea and vomiting and reports that she has vomited blood. Pt states that she started hurting yesterday, states that she has had pregnancy before and denies feeling this way

## 2022-03-09 NOTE — ED Notes (Addendum)
Pt began to vomit again  Provider aware Additional meds ordered

## 2022-03-09 NOTE — ED Notes (Signed)
Feels better  No vomiting since medication given

## 2022-03-09 NOTE — ED Provider Notes (Signed)
[facility] Provider Note    Event Date/Time   First MD Initiated Contact with Patient 03/09/22 1101     (approximate)   History   Abdominal Pain   HPI  Gina Lester is a 26 y.o. female G4P1 with LMP on 7/6 who presents with nausea and vomiting as well as crampy abdominal pain.  The patient states the nausea and vomiting started yesterday evening and has persisted.  She described the vomitus as green, and later yesterday and today she noticed some streaks of blood, although it is not grossly bloody.  She then started having crampy intermittent lower abdominal pain mainly on the right. She is not having any pain currently.  She denies diarrhea, dysuria, or any vaginal bleeding or discharge.  She has no fever or chills.      Physical Exam   Triage Vital Signs: ED Triage Vitals  Enc Vitals Group     BP 03/09/22 1027 107/67     Pulse Rate 03/09/22 1027 70     Resp 03/09/22 1027 18     Temp 03/09/22 1027 98.1 F (36.7 C)     Temp Source 03/09/22 1027 Oral     SpO2 03/09/22 1028 99 %     Weight 03/09/22 1028 144 lb 6.4 oz (65.5 kg)     Height 03/09/22 1028 5' (1.524 m)     Head Circumference --      Peak Flow --      Pain Score 03/09/22 1028 10     Pain Loc --      Pain Edu? --      Excl. in GC? --     Most recent vital signs: Vitals:   03/09/22 1028 03/09/22 1330  BP:  110/70  Pulse:  70  Resp:  18  Temp:  98 F (36.7 C)  SpO2: 99% 99%     General: Alert and oriented, well-appearing. CV:  Good peripheral perfusion.  Resp:  Normal effort.  Abd:  Soft and nontender.  No distention.  Other:  Moist mucous membranes.   ED Results / Procedures / Treatments   Labs (all labs ordered are listed, but only abnormal results are displayed) Labs Reviewed  COMPREHENSIVE METABOLIC PANEL - Abnormal; Notable for the following components:      Result Value   CO2 21 (*)    Total Protein 8.4 (*)    All other components within normal limits   CBC WITH DIFFERENTIAL/PLATELET - Abnormal; Notable for the following components:   RBC 5.32 (*)    Hemoglobin 15.1 (*)    Abs Immature Granulocytes 0.09 (*)    All other components within normal limits  URINALYSIS, ROUTINE W REFLEX MICROSCOPIC - Abnormal; Notable for the following components:   Color, Urine YELLOW (*)    APPearance HAZY (*)    Hgb urine dipstick SMALL (*)    Ketones, ur 80 (*)    Leukocytes,Ua TRACE (*)    All other components within normal limits  HCG, QUANTITATIVE, PREGNANCY - Abnormal; Notable for the following components:   hCG, Beta Chain, Quant, S 36,810 (*)    All other components within normal limits  LIPASE, BLOOD  POC URINE PREG, ED     EKG     RADIOLOGY  US OB: I independently viewed and interpreted the images; there is an IUP with no free fluid or other acute abnormality.  PROCEDURES:  Critical Care performed: No  Procedures   MEDICATIONS ORDERED IN ED: Medications  sodium chloride 0.9 % bolus 1,000 mL (1,000 mLs Intravenous New Bag/Given 03/09/22 1100)  metoCLOPramide (REGLAN) injection 10 mg (10 mg Intravenous Given 03/09/22 1100)  metoCLOPramide (REGLAN) injection 10 mg (10 mg Intravenous Given 03/09/22 1411)     IMPRESSION / MDM / ASSESSMENT AND PLAN / ED COURSE  I reviewed the triage vital signs and the nursing notes.  26 year old female G4P1 with LMP in early July presents with nausea and vomiting since yesterday along with crampy intermittent lower abdominal pain.  On exam the vital signs are normal.  The patient is well-appearing.  The abdomen is soft and nontender and she is not having any pain currently.  The patient was offered but declines pelvic exam, and this is reasonable given that she is not having any bleeding, discharge, or active pain.  Differential diagnosis includes, but is not limited to, hyperemesis gravidarum, gastroenteritis, gastritis, round ligament pain, less likely ectopic pregnancy, spontaneous abortion.  I  do not suspect ovarian torsion or other gynecologic cause given the intermittent nature of the pain and benign exam currently.  The patient has no bleeding or discharge.  The patient describes faint streaks of blood in the vomitus which are consistent with irritation of the oropharynx or esophagus or other benign etiology.  It is not consistent with a Mallory-Weiss tear and the patient has no risk factors or findings to suggest any active GI bleed.  Patient's presentation is most consistent with acute presentation with potential threat to life or bodily function.  We will give fluids and antiemetics, obtain lab work-up, pelvic ultrasound, and reassess.  The patient is on the cardiac monitor to evaluate for evidence of arrhythmia and/or significant heart rate changes.  ----------------------------------------- 3:30 PM on 03/09/2022 -----------------------------------------  Lab work-up is overall reassuring.  Electrolytes are normal.  There is no leukocytosis.  The patient may be slightly hemoconcentrated.  Urinalysis shows few WBCs but no bacteria or evidence of infection or asymptomatic bacteriuria.  There are ketones consistent with dehydration.  Ultrasound shows an IUP at 6 weeks.  On reassessment the patient appears well and states she is feeling significantly better.  She had 1 additional episode of vomiting but it has now resolved after Reglan and she is tolerating p.o.  She has had no abdominal pain throughout her ED stay.    At this time, the patient is stable for discharge home.  I counseled her on the results of the work-up.  She will follow-up at [facility].  I have prescribed Reglan.  Return precautions given, and she expresses understanding.     FINAL CLINICAL IMPRESSION(S) / ED DIAGNOSES   Final diagnoses:  Abdominal pain during pregnancy in first trimester  Nausea and vomiting, unspecified vomiting type     Rx / DC Orders   ED Discharge Orders          Ordered     metoCLOPramide (REGLAN) 10 MG tablet  Every 8 hours PRN        03/09/22 1527             Note:  This document was prepared using Dragon voice recognition software and may include unintentional dictation errors.    Dionne Bucy, MD 03/09/22 [phone]

## 2022-03-09 NOTE — ED Notes (Signed)
See triage note  Presents with some n/v and abd cramping  States she is approx 6-[redacted] weeks pregnant  Denies any vaginal bleeding

## 2022-03-22 ENCOUNTER — Ambulatory Visit (INDEPENDENT_AMBULATORY_CARE_PROVIDER_SITE_OTHER): Payer: Medicaid Other

## 2022-03-22 DIAGNOSIS — Z369 Encounter for antenatal screening, unspecified: Secondary | ICD-10-CM

## 2022-03-22 DIAGNOSIS — Z3689 Encounter for other specified antenatal screening: Secondary | ICD-10-CM

## 2022-03-22 DIAGNOSIS — Z3481 Encounter for supervision of other normal pregnancy, first trimester: Secondary | ICD-10-CM

## 2022-03-22 DIAGNOSIS — Z3480 Encounter for supervision of other normal pregnancy, unspecified trimester: Secondary | ICD-10-CM | POA: Insufficient documentation

## 2022-03-22 NOTE — Progress Notes (Signed)
New OB Intake  I connected with  Gina Lester on 03/22/22 at  8:15 AM EDT by telephone and verified that I am speaking with the correct person using two identifiers. Nurse is located at [facility] and pt is located in car.   I explained I am completing New OB Intake today. We discussed her EDD of 10/30/2022 that is based on LMP of 01/23/2022 (exact). Pt is G4/P2012. I reviewed her allergies, medications, Medical/Surgical/OB history, and appropriate screenings. Based on history, this is a/an pregnancy uncomplicated .   Patient Active Problem List   Diagnosis Date Noted   Choledocholithiasis    Abnormal LFTs (liver function tests)    Biliary colic 05/15/2018   Abdominal pain 04/17/2014   Transaminitis 04/17/2014    Concerns addressed today None  Delivery Plans:  Plans to deliver at [facility].  Anatomy US Explained first scheduled [place] will be around 20 weeks.   Labs Discussed genetic screening with patient. Patient declines genetic testing.  Discussed possible labs to be drawn at new OB appointment.  COVID Vaccine Patient has had COVID vaccine.   Social Determinants of Health Food Insecurity: denies food insecurity Transportation: Patient denies transportation needs. Childcare: Discussed no children allowed at ultrasound appointments.   First visit review I reviewed new OB appt with pt. I explained she will have ob bloodwork and pap smear/pelvic exam if indicated. Explained pt will be seen by Doreene Burke, CNM at first visit; encounter routed to appropriate provider.   Loran Senters, [facility] 03/22/2022  8:39 AM  Clinical Staff Provider  Office Location  [facility] Dating    Language  English Anatomy US    Flu Vaccine  offer Genetic Screen  NIPS:   TDaP vaccine  offer Hgb A1C or  GTT Early : Third trimester :   Covid No boosters   LAB RESULTS   Rhogam   Blood Type     Feeding Plan breast Antibody    Contraception Undecided   Rubella    Circumcision no RPR     Pediatrician  [facility]     Support Person Devin HIV    Prenatal Classes no Varicella     GBS  (For PCN allergy, check sensitivities)   BTL Consent  Hep C   VBAC Consent  Pap      Hgb Electro      CF      SMA

## 2022-03-30 ENCOUNTER — Telehealth: Payer: Self-pay | Admitting: Certified Nurse Midwife

## 2022-03-30 NOTE — Telephone Encounter (Signed)
Info sent to patient in regards to nausea/meds

## 2022-03-30 NOTE — Telephone Encounter (Signed)
Patient is calling with severe nausea has been having multiple vomiting episodes last night and can seem to keep anything down. Patient would like to know if there is something she can take. Please advise?

## 2022-04-03 ENCOUNTER — Other Ambulatory Visit: Payer: Medicaid Other

## 2022-04-03 ENCOUNTER — Other Ambulatory Visit: Payer: Self-pay

## 2022-04-03 DIAGNOSIS — Z3481 Encounter for supervision of other normal pregnancy, first trimester: Secondary | ICD-10-CM

## 2022-04-03 DIAGNOSIS — Z369 Encounter for antenatal screening, unspecified: Secondary | ICD-10-CM

## 2022-04-03 DIAGNOSIS — O219 Vomiting of pregnancy, unspecified: Secondary | ICD-10-CM

## 2022-04-03 MED ORDER — DOXYLAMINE-PYRIDOXINE 10-10 MG PO TBEC: 4.0000 | DELAYED_RELEASE_TABLET | Freq: Every day | ORAL | 1 refills | Status: AC

## 2022-04-03 NOTE — Telephone Encounter (Signed)
Patient came in today for labs. Patient reports she still can't keep anything down and nothing seem to be working. Patient bought Emetrol  yesterday and this isn't working aswell. Please advise?

## 2022-04-03 NOTE — Telephone Encounter (Signed)
Rx sent to pharmacy on file per protocol for nausea/vomiting

## 2022-04-04 LAB — CBC/D/PLT+RPR+RH+ABO+RUBIGG...
Antibody Screen: NEGATIVE
Basophils Absolute: 0 10*3/uL (ref 0.0–0.2)
Basos: 0 %
EOS (ABSOLUTE): 0.2 10*3/uL (ref 0.0–0.4)
Eos: 2 %
HCV Ab: NONREACTIVE
HIV Screen 4th Generation wRfx: NONREACTIVE
Hematocrit: 44.6 % (ref 34.0–46.6)
Hemoglobin: 15.4 g/dL (ref 11.1–15.9)
Hepatitis B Surface Ag: NEGATIVE
Immature Grans (Abs): 0.1 10*3/uL (ref 0.0–0.1)
Immature Granulocytes: 1 %
Lymphocytes Absolute: 2.7 10*3/uL (ref 0.7–3.1)
Lymphs: 29 %
MCH: 29.9 pg (ref 26.6–33.0)
MCHC: 34.5 g/dL (ref 31.5–35.7)
MCV: 87 fL (ref 79–97)
Monocytes Absolute: 0.5 10*3/uL (ref 0.1–0.9)
Monocytes: 5 %
Neutrophils Absolute: 5.7 10*3/uL (ref 1.4–7.0)
Neutrophils: 63 %
Platelets: 257 10*3/uL (ref 150–450)
RBC: 5.15 x10E6/uL (ref 3.77–5.28)
RDW: 13.8 % (ref 11.7–15.4)
RPR Ser Ql: NONREACTIVE
Rh Factor: POSITIVE
Rubella Antibodies, IGG: 2.04 index (ref >0.99)
Varicella zoster IgG: <135 index — ABNORMAL LOW (ref >165)
WBC: 9.1 10*3/uL (ref 3.4–10.8)

## 2022-04-04 LAB — MICROSCOPIC EXAMINATION
Casts: NONE SEEN /lpf
Epithelial Cells (non renal): >10 /hpf — AB (ref 0–10)
RBC, Urine: NONE SEEN /hpf (ref 0–2)

## 2022-04-04 LAB — URINALYSIS, ROUTINE W REFLEX MICROSCOPIC
Bilirubin, UA: NEGATIVE
Glucose, UA: NEGATIVE
Nitrite, UA: NEGATIVE
RBC, UA: NEGATIVE
Specific Gravity, UA: 1.027 (ref 1.005–1.030)
Urobilinogen, Ur: 1 mg/dL (ref 0.2–1.0)
pH, UA: 6.5 (ref 5.0–7.5)

## 2022-04-04 LAB — HCV INTERPRETATION

## 2022-04-04 LAB — SPECIMEN STATUS REPORT

## 2022-04-06 LAB — URINE CULTURE, OB REFLEX

## 2022-04-06 LAB — CULTURE, OB URINE

## 2022-04-08 DIAGNOSIS — O98319 Other infections with a predominantly sexual mode of transmission complicating pregnancy, unspecified trimester: Secondary | ICD-10-CM | POA: Insufficient documentation

## 2022-04-08 DIAGNOSIS — A64 Unspecified sexually transmitted disease: Secondary | ICD-10-CM

## 2022-04-08 HISTORY — DX: Unspecified sexually transmitted disease: A64

## 2022-04-10 LAB — MONITOR DRUG PROFILE 14(MW)
Amphetamine Scrn, Ur: NEGATIVE ng/mL
BARBITURATE SCREEN URINE: NEGATIVE ng/mL
BENZODIAZEPINE SCREEN, URINE: NEGATIVE ng/mL
Buprenorphine, Urine: NEGATIVE ng/mL
Cocaine (Metab) Scrn, Ur: NEGATIVE ng/mL
Creatinine(Crt), U: 295.5 mg/dL (ref 20.0–300.0)
Fentanyl, Urine: NEGATIVE pg/mL
Meperidine Screen, Urine: NEGATIVE ng/mL
Methadone Screen, Urine: NEGATIVE ng/mL
OXYCODONE+OXYMORPHONE UR QL SCN: NEGATIVE ng/mL
Opiate Scrn, Ur: NEGATIVE ng/mL
Ph of Urine: 6.2 (ref 4.5–8.9)
Phencyclidine Qn, Ur: NEGATIVE ng/mL
Propoxyphene Scrn, Ur: NEGATIVE ng/mL
SPECIFIC GRAVITY: 1.011
Tramadol Screen, Urine: NEGATIVE ng/mL

## 2022-04-10 LAB — CANNABINOID (GC/MS), URINE
Cannabinoid: POSITIVE — AB
Carboxy THC (GC/MS): >750 ng/mL

## 2022-04-11 ENCOUNTER — Other Ambulatory Visit: Payer: Self-pay | Admitting: Certified Nurse Midwife

## 2022-04-11 ENCOUNTER — Telehealth: Payer: Self-pay | Admitting: Certified Nurse Midwife

## 2022-04-11 MED ORDER — AZITHROMYCIN 500 MG PO TABS: 1000.0000 mg | ORAL_TABLET | Freq: Once | ORAL | 0 refills | Status: AC

## 2022-04-11 NOTE — Telephone Encounter (Signed)
Patient called and states she was seen at [facility] on 04/08/2022 where she tested positive for Chlamydia and Gonorrhea. Patient was not treated at the ER and is asking for a RX to be sent to pharmacy for treatment. Verified pharmacy with patient as [facility] on [address]. Please advise.

## 2022-04-11 NOTE — Telephone Encounter (Signed)
Pt made aware of information and to come in for injection/treatment.

## 2022-04-13 ENCOUNTER — Ambulatory Visit (INDEPENDENT_AMBULATORY_CARE_PROVIDER_SITE_OTHER): Payer: Medicaid Other | Admitting: Certified Nurse Midwife

## 2022-04-13 VITALS — Ht 60.0 in | Wt 144.0 lb

## 2022-04-13 DIAGNOSIS — N390 Urinary tract infection, site not specified: Secondary | ICD-10-CM

## 2022-04-13 MED ORDER — CEFTRIAXONE SODIUM 500 MG IJ SOLR
500.0000 mg | Freq: Once | INTRAMUSCULAR | Status: AC
  Administered 2022-04-13: 500 mg via INTRAMUSCULAR

## 2022-04-13 NOTE — Progress Notes (Unsigned)
Pt presents today for Ceftriaxone 500 injection per provider for recurrant UTI symptoms. Pt tolerated injection well, all questions answered.

## 2022-04-18 ENCOUNTER — Encounter: Payer: Medicaid Other | Admitting: Certified Nurse Midwife

## 2022-04-18 DIAGNOSIS — Z1379 Encounter for other screening for genetic and chromosomal anomalies: Secondary | ICD-10-CM

## 2022-05-08 ENCOUNTER — Ambulatory Visit (INDEPENDENT_AMBULATORY_CARE_PROVIDER_SITE_OTHER): Payer: Medicaid Other | Admitting: Certified Nurse Midwife

## 2022-05-08 ENCOUNTER — Encounter: Payer: Self-pay | Admitting: Certified Nurse Midwife

## 2022-05-08 ENCOUNTER — Other Ambulatory Visit (HOSPITAL_COMMUNITY)
Admission: RE | Admit: 2022-05-08 | Discharge: 2022-05-08 | Disposition: A | Discharge status: Home / Self Care | Payer: Medicaid Other | Source: Ambulatory Visit | Attending: Certified Nurse Midwife | Admitting: Certified Nurse Midwife

## 2022-05-08 VITALS — BP 100/65 | HR 71 | Wt 137.5 lb

## 2022-05-08 DIAGNOSIS — Z3482 Encounter for supervision of other normal pregnancy, second trimester: Secondary | ICD-10-CM

## 2022-05-08 DIAGNOSIS — Z3480 Encounter for supervision of other normal pregnancy, unspecified trimester: Secondary | ICD-10-CM

## 2022-05-08 LAB — POCT URINALYSIS DIPSTICK OB
Bilirubin, UA: NEGATIVE
Blood, UA: NEGATIVE
Glucose, UA: NEGATIVE
Ketones, UA: NEGATIVE
Leukocytes, UA: NEGATIVE
Nitrite, UA: NEGATIVE
POC,PROTEIN,UA: NEGATIVE
Spec Grav, UA: 1.025 (ref 1.010–1.025)
Urobilinogen, UA: 0.2 U/dL
pH, UA: 5 (ref 5.0–8.0)

## 2022-05-08 NOTE — Progress Notes (Signed)
NEW OB HISTORY AND PHYSICAL  SUBJECTIVE:       Gina Lester is a 26 y.o. [phone] female, Patient's last menstrual period was 01/23/2022 (exact date)., Estimated Date of Delivery: 10/30/22, [redacted]w[redacted]d, presents today for establishment of Prenatal Care. 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 Normal Contraception: none Last Pap: has not had.   Obstetric History OB History  Gravida Para Term Preterm AB Living  4 2 2   1 2   SAB IAB Ectopic Multiple Live Births  1     0 2    # Outcome Date GA Lbr Len/2nd Weight Sex Delivery Anes PTL Lv  4 Current           3 Term 04/08/18 [redacted]w[redacted]d / 00:02 7 lb 8.6 oz (3.42 kg) M Vag-Spont None  LIV  2 SAB 2018          1 Term 12/11/15 [redacted]w[redacted]d / 00:33 7 lb 7.2 oz (3.38 kg) M Vag-Spont None  LIV    Past Medical History:  Diagnosis Date   Constipation    Seasonal allergies     Past Surgical History:  Procedure Laterality Date   CHOLECYSTECTOMY N/A 05/17/2018   Procedure: LAPAROSCOPIC CHOLECYSTECTOMY;  Surgeon: Olean Ree, MD;  Location: [facility] ORS;  Service: General;  Laterality: N/A;   ENDOSCOPIC RETROGRADE CHOLANGIOPANCREATOGRAPHY (ERCP) WITH PROPOFOL N/A 05/16/2018   Procedure: ENDOSCOPIC RETROGRADE CHOLANGIOPANCREATOGRAPHY (ERCP) WITH PROPOFOL;  Surgeon: Lucilla Lame, MD;  Location: [facility] ENDOSCOPY;  Service: Endoscopy;  Laterality: N/A;    Current Outpatient Medications on File Prior to Visit  Medication Sig Dispense Refill   Prenatal Vit-Fe Fumarate-FA (MULTIVITAMIN-PRENATAL) 27-0.8 MG TABS tablet Take 1 tablet by mouth daily at 12 noon.     metoCLOPramide (REGLAN) 10 MG tablet Take 1 tablet (10 mg total) by mouth every 8 (eight) hours as needed for nausea or vomiting. 15 tablet 0   No current  facility-administered medications on file prior to visit.    No Known Allergies  Social History   Socioeconomic History   Marital status: Single    Spouse name: Not on file   Number of children: 2   Years of education: 16   Highest education level: Not on file  Occupational History   Occupation: retail  Tobacco Use   Smoking status: Never   Smokeless tobacco: Never  Vaping Use   Vaping Use: Never used  Substance and Sexual Activity   Alcohol use: Not Currently   Drug use: No   Sexual activity: Yes    Partners: Male    Birth control/protection: None  Other Topics Concern   Not on file  Social History Narrative   Patient is in the 11th grade and lives with mother and father in [place].   Social Determinants of Health   Financial Resource Strain: Low Risk  (03/22/2022)   Overall Financial Resource Strain (CARDIA)    Difficulty of Paying Living Expenses: Not hard at all  Food Insecurity: No Food Insecurity (03/22/2022)   Hunger Vital Sign    Worried About Running Out of Food in the Last Year: Never true    Ran Out of Food in the Last Year: Never true  Transportation Needs: No Transportation Needs (03/22/2022)   PRAPARE - Transportation    Lack of  Transportation (Medical): No    Lack of Transportation (Non-Medical): No  Physical Activity: Inactive (03/22/2022)   Exercise Vital Sign    Days of Exercise per Week: 0 days    Minutes of Exercise per Session: 0 min  Stress: No Stress Concern Present (03/22/2022)   [place]    Feeling of Stress : Not at all  Social Connections: Unknown (03/22/2022)   Social Connection and Isolation Panel [NHANES]    Frequency of Communication with Friends and Family: More than three times a week    Frequency of Social Gatherings with Friends and Family: Twice a week    Attends Religious Services: Never    Marine scientist or Organizations: No    Attends Theatre manager Meetings: Never    Marital Status: Not on file  Intimate Partner Violence: Not At Risk (03/22/2022)   Humiliation, Afraid, Rape, and Kick questionnaire    Fear of Current or Ex-Partner: No    Emotionally Abused: No    Physically Abused: No    Sexually Abused: No    Family History  Problem Relation Age of Onset   Gallbladder disease Mother    Breast cancer Neg Hx    Ovarian cancer Neg Hx    Colon cancer Neg Hx     The following portions of the patient's history were reviewed and updated as appropriate: allergies, current medications, past OB history, past medical history, past surgical history, past family history, past social history, and problem list.    OBJECTIVE: Initial Physical Exam (New OB)  GENERAL APPEARANCE: alert, well appearing, in no apparent distress, oriented to person, place and time HEAD: normocephalic, atraumatic MOUTH: mucous membranes moist, pharynx normal without lesions THYROID: no thyromegaly or masses present BREASTS: no masses noted, no significant tenderness, no palpable axillary nodes, no skin changes LUNGS: clear to auscultation, no wheezes, rales or rhonchi, symmetric air entry HEART: regular rate and rhythm, no murmurs ABDOMEN: soft, nontender, nondistended, no abnormal masses, no epigastric pain. FHT present EXTREMITIES: no redness or tenderness in the calves or thighs SKIN: normal coloration and turgor, no rashes LYMPH NODES: no adenopathy palpable NEUROLOGIC: alert, oriented, normal speech, no focal findings or movement disorder noted  PELVIC EXAM  EXTERNAL GENITALIA: normal appearing vulva with no masses, tenderness or lesions VAGINA: no abnormal discharge or lesions CERVIX: no lesions or cervical motion tenderness, pap collected UTERUS: gravid ADNEXA: no masses palpable and nontender OB EXAM PELVIMETRY: appears adequate RECTUM: exam not indicated  ASSESSMENT: Normal pregnancy  PLAN: Prenatal care See orderNew OB  counseling: The patient has been given an overview regarding routine prenatal care. Recommendations regarding diet, weight gain, and exercise in pregnancy were given. Prenatal testing, optional genetic testing, carrier screening , and ultrasound use in pregnancy were reviewed.  Benefits of Breast Feeding were discussed. The patient is encouraged to consider nursing her baby post partum.   Philip Aspen, CNM

## 2022-05-08 NOTE — Patient Instructions (Signed)
Prenatal Care Prenatal care is health care during pregnancy. It helps you and your unborn baby (fetus) stay as healthy as possible. Prenatal care may be provided by a midwife, a family practice doctor, a mid-level practitioner (nurse practitioner or physician assistant), or a childbirth and pregnancy doctor (obstetrician). How does this affect me? During pregnancy, you will be closely monitored for any new conditions that might develop. To lower your risk of pregnancy complications, you and your health care provider will talk about any underlying conditions you have. How does this affect my baby? Early and consistent prenatal care increases the chance that your baby will be healthy during pregnancy. Prenatal care lowers the risk that your baby will be: Born early (prematurely). Smaller than expected at birth (small for gestational age). What can I expect at the first prenatal care visit? Your first prenatal care visit will likely be the longest. You should schedule your first prenatal care visit as soon as you know that you are pregnant. Your first visit is a good time to talk about any questions or concerns you have about pregnancy. Medical history At your visit, you and your health care provider will talk about your medical history, including: Any past pregnancies. Your family's medical history. Medical history of the baby's father. Any long-term (chronic) health conditions you have and how you manage them. Any surgeries or procedures you have had. Any current over-the-counter or prescription medicines, herbs, or supplements that you are taking. Other factors that could pose a risk to your baby, including: Exposure to harmful chemicals or radiation at work or at home. Any substance use, including tobacco, alcohol, and drug use. Your home setting and your stress levels, including: Exposure to abuse or violence. Household financial strain. Your daily health habits, including diet and  exercise. Tests and screenings Your health care provider will: Measure your weight, height, and blood pressure. Do a physical exam, including a pelvic and breast exam. Perform blood tests and urine tests to check for: Urinary tract infection. Sexually transmitted infections (STIs). Low iron levels in your blood (anemia). Blood type and certain proteins on red blood cells (Rh antibodies). Infections and immunity to viruses, such as hepatitis B and rubella. HIV (human immunodeficiency virus). Discuss your options for genetic screening. Tips about staying healthy Your health care provider will also give you information about how to keep yourself and your baby healthy, including: Nutrition and taking vitamins. Physical activity. How to manage pregnancy symptoms such as nausea and vomiting (morning sickness). Infections and substances that may be harmful to your baby and how to avoid them. Food safety. Dental care. Working. Travel. Warning signs to watch for and when to call your health care provider. How often will I have prenatal care visits? After your first prenatal care visit, you will have regular visits throughout your pregnancy. The visit schedule is often as follows: Up to week 28 of pregnancy: once every 4 weeks. 28-36 weeks: once every 2 weeks. After 36 weeks: every week until delivery. Some women may have visits more or less often depending on any underlying health conditions and the health of the baby. Keep all follow-up and prenatal care visits. This is important. What happens during routine prenatal care visits? Your health care provider will: Measure your weight and blood pressure. Check for fetal heart sounds. Measure the height of your uterus in your abdomen (fundal height). This may be measured starting around week 20 of pregnancy. Check the position of your baby inside your uterus. Ask questions   about your diet, sleeping patterns, and whether you can feel the baby  move. Review warning signs to watch for and signs of labor. Ask about any pregnancy symptoms you are having and how you are dealing with them. Symptoms may include: Headaches. Nausea and vomiting. Vaginal discharge. Swelling. Fatigue. Constipation. Changes in your vision. Feeling persistently sad or anxious. Any discomfort, including back or pelvic pain. Bleeding or spotting. Make a list of questions to ask your health care provider at your routine visits. What tests might I have during prenatal care visits? You may have blood, urine, and imaging tests throughout your pregnancy, such as: Urine tests to check for glucose, protein, or signs of infection. Glucose tests to check for a form of diabetes that can develop during pregnancy (gestational diabetes mellitus). This is usually done around week 24 of pregnancy. Ultrasounds to check your baby's growth and development, to check for birth defects, and to check your baby's well-being. These can also help to decide when you should deliver your baby. A test to check for group B strep (GBS) infection. This is usually done around week 36 of pregnancy. Genetic testing. This may include blood, fluid, or tissue sampling, or imaging tests, such as an ultrasound. Some genetic tests are done during the first trimester and some are done during the second trimester. What else can I expect during prenatal care visits? Your health care provider may recommend getting certain vaccines during pregnancy. These may include: A yearly flu shot (annual influenza vaccine). This is especially important if you will be pregnant during flu season. Tdap (tetanus, diphtheria, pertussis) vaccine. Getting this vaccine during pregnancy can protect your baby from whooping cough (pertussis) after birth. This vaccine may be recommended between weeks 27 and 36 of pregnancy. A COVID-19 vaccine. Later in your pregnancy, your health care provider may give you information  about: Childbirth and breastfeeding classes. Choosing a health care provider for your baby. Umbilical cord banking. Breastfeeding. Birth control after your baby is born. The hospital labor and delivery unit and how to set up a tour. Registering at the hospital before you go into labor. Where to find more information Office on Women's Health: womenshealth.gov American Pregnancy Association: americanpregnancy.org March of Dimes: marchofdimes.org Summary Prenatal care helps you and your baby stay as healthy as possible during pregnancy. Your first prenatal care visit will most likely be the longest. You will have visits and tests throughout your pregnancy to monitor your health and your baby's health. Bring a list of questions to your visits to ask your health care provider. Make sure to keep all follow-up and prenatal care visits. This information is not intended to replace advice given to you by your health care provider. Make sure you discuss any questions you have with your health care provider. Document Revised: 04/21/2020 Document Reviewed: 04/21/2020 Elsevier Patient Education  2023 [employer]  

## 2022-05-09 LAB — CERVICOVAGINAL ANCILLARY ONLY
Bacterial Vaginitis (gardnerella): NEGATIVE
Candida Glabrata: NEGATIVE
Candida Vaginitis: NEGATIVE
Chlamydia: NEGATIVE
Comment: NEGATIVE
Comment: NEGATIVE
Comment: NEGATIVE
Comment: NEGATIVE
Comment: NEGATIVE
Comment: NORMAL
Neisseria Gonorrhea: NEGATIVE
Trichomonas: NEGATIVE

## 2022-05-12 LAB — MATERNIT 21 PLUS CORE, BLOOD
Fetal Fraction: 7
Result (T21): NEGATIVE
Trisomy 13 (Patau syndrome): NEGATIVE
Trisomy 18 (Edwards syndrome): NEGATIVE
Trisomy 21 (Down syndrome): NEGATIVE

## 2022-05-15 ENCOUNTER — Encounter: Payer: Self-pay | Admitting: Certified Nurse Midwife

## 2022-05-15 LAB — CYTOLOGY - PAP
Comment: NEGATIVE
Comment: NEGATIVE
Comment: NEGATIVE
HPV 16: NEGATIVE
HPV 18 / 45: NEGATIVE
High risk HPV: POSITIVE — AB

## 2022-06-06 ENCOUNTER — Ambulatory Visit (INDEPENDENT_AMBULATORY_CARE_PROVIDER_SITE_OTHER): Payer: Medicaid Other

## 2022-06-06 ENCOUNTER — Encounter: Payer: Self-pay | Admitting: Licensed Practical Nurse

## 2022-06-06 ENCOUNTER — Ambulatory Visit (INDEPENDENT_AMBULATORY_CARE_PROVIDER_SITE_OTHER): Payer: Medicaid Other | Admitting: Licensed Practical Nurse

## 2022-06-06 VITALS — BP 106/67 | HR 78 | Wt 141.0 lb

## 2022-06-06 DIAGNOSIS — Z23 Encounter for immunization: Secondary | ICD-10-CM | POA: Diagnosis not present

## 2022-06-06 DIAGNOSIS — Z3A19 19 weeks gestation of pregnancy: Secondary | ICD-10-CM

## 2022-06-06 DIAGNOSIS — Z3A18 18 weeks gestation of pregnancy: Secondary | ICD-10-CM

## 2022-06-06 DIAGNOSIS — Z3480 Encounter for supervision of other normal pregnancy, unspecified trimester: Secondary | ICD-10-CM

## 2022-06-06 DIAGNOSIS — Z3689 Encounter for other specified antenatal screening: Secondary | ICD-10-CM | POA: Diagnosis not present

## 2022-06-06 DIAGNOSIS — Z3482 Encounter for supervision of other normal pregnancy, second trimester: Secondary | ICD-10-CM

## 2022-06-06 LAB — POCT URINALYSIS DIPSTICK
Bilirubin, UA: NEGATIVE
Blood, UA: NEGATIVE
Glucose, UA: NEGATIVE
Ketones, UA: NEGATIVE
Leukocytes, UA: NEGATIVE
Nitrite, UA: NEGATIVE
Protein, UA: NEGATIVE
Spec Grav, UA: 1.01 (ref 1.010–1.025)
Urobilinogen, UA: 0.2 E.U./dL
pH, UA: 5 (ref 5.0–8.0)

## 2022-06-06 NOTE — Patient Instructions (Signed)

## 2022-06-06 NOTE — Progress Notes (Signed)
Routine Prenatal Care Visit  Subjective  Gina Lester Shon Hale is a 26 y.o. [phone] at [redacted]w[redacted]d being seen today for ongoing prenatal care.  She is currently monitored for the following issues for this low-risk pregnancy and has Abdominal pain; Transaminitis; Biliary colic; Choledocholithiasis; Abnormal LFTs (liver function tests); and Supervision of other normal pregnancy, antepartum on their problem list.  ----------------------------------------------------------------------------------- Patient reports  nasal congestion and runny nose .  Here with her sons. Doing ok, remedies for nasal congestion reviewed  -had anatomy  US today-normal  -TWG -2lbs, starting to feel better, is eating  Contractions: Not present. Vag. Bleeding: None.  Movement: Present. Leaking Fluid denies.  ----------------------------------------------------------------------------------- The following portions of the patient's history were reviewed and updated as appropriate: allergies, current medications, past family history, past medical history, past social history, past surgical history and problem list. Problem list updated.  Objective  Blood pressure 106/67, pulse 78, weight 141 lb (64 kg), last menstrual period 01/23/2022. Pregravid weight 143 lb (64.9 kg) Total Weight Gain -2 lb (-0.907 kg) Urinalysis: Urine Protein    Urine Glucose    Fetal Status: Fetal Heart Rate (bpm): 141   Movement: Present     General:  Alert, oriented and cooperative. Patient is in no acute distress.  Skin: Skin is warm and dry. No rash noted.   Cardiovascular: Normal heart rate noted  Respiratory: Normal respiratory effort, no problems with respiration noted  Abdomen: Soft, gravid, appropriate for gestational age. Pain/Pressure: Absent     Pelvic:  Cervical exam deferred        Extremities: Normal range of motion.  Edema: None  Mental Status: Normal mood and affect. Normal behavior. Normal judgment and thought content.   Assessment    26 y.o. [id number] at [redacted]w[redacted]d by  10/30/2022, by Last Menstrual Period presenting for routine prenatal visit  Plan   fourth Problems (from 03/22/22 to present)     No problems associated with this episode.        Preterm labor symptoms and general obstetric precautions including but not limited to vaginal bleeding, contractions, leaking of fluid and fetal movement were reviewed in detail with the patient. Please refer to After Visit Summary for other counseling recommendations.   Return in about 4 weeks (around 07/04/2022) for ROB. Flu vaccine given  Carie Caddy, CNM  [facility], [facility]  06/06/22  11:50 AM

## 2022-07-05 ENCOUNTER — Ambulatory Visit (INDEPENDENT_AMBULATORY_CARE_PROVIDER_SITE_OTHER): Payer: Medicaid Other | Admitting: Certified Nurse Midwife

## 2022-07-05 ENCOUNTER — Encounter: Payer: Self-pay | Admitting: Certified Nurse Midwife

## 2022-07-05 VITALS — BP 100/63 | HR 61 | Wt 142.9 lb

## 2022-07-05 DIAGNOSIS — Z3A23 23 weeks gestation of pregnancy: Secondary | ICD-10-CM

## 2022-07-05 LAB — POCT URINALYSIS DIPSTICK OB
Bilirubin, UA: NEGATIVE
Blood, UA: NEGATIVE
Glucose, UA: NEGATIVE
Ketones, UA: NEGATIVE
Leukocytes, UA: NEGATIVE
Nitrite, UA: NEGATIVE
POC,PROTEIN,UA: NEGATIVE
Spec Grav, UA: <=1.005 — AB (ref 1.010–1.025)
Urobilinogen, UA: 1 E.U./dL
pH, UA: 7.5 (ref 5.0–8.0)

## 2022-07-05 NOTE — Addendum Note (Signed)
Addended by: Fonda Kinder on: 07/05/2022 10:56 AM   Modules accepted: Orders

## 2022-07-05 NOTE — Patient Instructions (Signed)
Oral Glucose Tolerance Test During Pregnancy Why am I having this test? The oral glucose tolerance test (OGTT) is done to check how your body processes blood sugar (glucose). This is one of several tests used to diagnose diabetes that develops during pregnancy (gestational diabetes mellitus). Gestational diabetes is a short-term form of diabetes that some women develop while they are pregnant. It usually occurs during the second trimester of pregnancy and goes away after delivery. Testing, or screening, for gestational diabetes usually occurs at weeks 24-28 of pregnancy. You may have the OGTT test after having a 1-hour glucose screening test if the results from that test indicate that you may have gestational diabetes. This test may also be needed if: You have a history of gestational diabetes. There is a history of giving birth to very large babies or of losing pregnancies (having stillbirths). You have signs and symptoms of diabetes, such as: Changes in your eyesight. Tingling or numbness in your hands or feet. Changes in hunger, thirst, and urination, and these are not explained by your pregnancy. What is being tested? This test measures the amount of glucose in your blood at different times during a period of 3 hours. This shows how well your body can process glucose. What kind of sample is taken?  Blood samples are required for this test. They are usually collected by inserting a needle into a blood vessel. How do I prepare for this test? For 3 days before your test, eat normally. Have plenty of carbohydrate-rich foods. Follow instructions from your health care provider about: Eating or drinking restrictions on the day of the test. You may be asked not to eat or drink anything other than water (to fast) starting 8-10 hours before the test. Changing or stopping your regular medicines. Some medicines may interfere with this test. Tell a health care provider about: All medicines you are  taking, including vitamins, herbs, eye drops, creams, and over-the-counter medicines. Any blood disorders you have. Any surgeries you have had. Any medical conditions you have. What happens during the test? First, your blood glucose will be measured. This is referred to as your fasting blood glucose because you fasted before the test. Then, you will drink a glucose solution that contains a certain amount of glucose. Your blood glucose will be measured again 1, 2, and 3 hours after you drink the solution. This test takes about 3 hours to complete. You will need to stay at the testing location during this time. During the testing period: Do not eat or drink anything other than the glucose solution. Do not exercise. Do not use any products that contain nicotine or tobacco, such as cigarettes, e-cigarettes, and chewing tobacco. These can affect your test results. If you need help quitting, ask your health care provider. The testing procedure may vary among health care providers and hospitals. How are the results reported? Your results will be reported as milligrams of glucose per deciliter of blood (mg/dL) or millimoles per liter (mmol/L). There is more than one source for screening and diagnosis reference values used to diagnose gestational diabetes. Your health care provider will compare your results to normal values that were established after testing a large group of people (reference values). Reference values may vary among labs and hospitals. For this test (Carpenter-Coustan), reference values are: Fasting: 95 mg/dL (5.3 mmol/L). 1 hour: 180 mg/dL (10.0 mmol/L). 2 hour: 155 mg/dL (8.6 mmol/L). 3 hour: 140 mg/dL (7.8 mmol/L). What do the results mean? Results below the reference values are   considered normal. If two or more of your blood glucose levels are at or above the reference values, you may be diagnosed with gestational diabetes. If only one level is high, your health care provider may  suggest repeat testing or other tests to confirm a diagnosis. Talk with your health care provider about what your results mean. Questions to ask your health care provider Ask your health care provider, or the department that is doing the test: When will my results be ready? How will I get my results? What are my treatment options? What other tests do I need? What are my next steps? Summary The oral glucose tolerance test (OGTT) is one of several tests used to diagnose diabetes that develops during pregnancy (gestational diabetes mellitus). Gestational diabetes is a short-term form of diabetes that some women develop while they are pregnant. You may have the OGTT test after having a 1-hour glucose screening test if the results from that test show that you may have gestational diabetes. You may also have this test if you have any symptoms or risk factors for this type of diabetes. Talk with your health care provider about what your results mean. This information is not intended to replace advice given to you by your health care provider. Make sure you discuss any questions you have with your health care provider. Document Revised: 02/13/2022 Document Reviewed: 12/17/2019 Elsevier Patient Education  2023 Elsevier Inc.  

## 2022-07-05 NOTE — Progress Notes (Signed)
ROB , doing well, feeling good movement. Discussed glucose screen next visit. Information sheet given. She verbalizes and agrees. She denies any concerns today. Follow up 4 wks.   Doreene Burke, CNM

## 2022-07-23 NOTE — L&D Delivery Note (Signed)
Obstetrical Delivery Note   Date of Delivery:   11/02/2022 Primary OB:   [facility]  Gestational Age/EDD: [redacted]w[redacted]d (Dated by L and 6wkUS) Reason for Admission: Oligohydramnios  Antepartum complications: oligohydramnios  Delivered By:   Siri Cole, CNM   Delivery Type:   spontaneous vaginal delivery  Delivery Details:   Was seen in the office for ROB, her membranes were swept, a BPP showed an AFI of 1.22. It as advised that she proceed with an IOL. She arrived in early labor VE 3/70/-2, Pitocin was started for augmentation. Around midnight she had an spontaneous urge to bear down, she was complete and 0 station at that time. After that she pushed with each contraction, while reclined with her legs pulled back she had SVB of a viable female at 65. Infant birthed OA followed quickly by shoulders, infant to mother's abdomen, spontaneous cry, HR> 100, pink by 5 mins. Cord doubly clamped and cut by FOB. Infant placed skin to skin with FOB. Mother assisted to squat to deliver placenta. On inspection of the perineum an intact perineum found with a few shallow abrasions on the labia. Infant skin to skin with mother, both stable.   Anesthesia:    none Intrapartum complications: None GBS:    Negative Laceration:    none Episiotomy:    none Rectal exam:   deferred Placenta:    Delivered and expressed via active management. Intact: yes. Many calcifications To pathology: no.  Delayed Cord Clamping: yes Estimated Blood Loss:   Baby:    Liveborn female, APGARs 8/9, weight pending gm  Carie Caddy, CNM  [facility]  11/02/2022 1:10 AM

## 2022-08-02 ENCOUNTER — Encounter: Payer: Self-pay | Admitting: Certified Nurse Midwife

## 2022-08-02 ENCOUNTER — Other Ambulatory Visit: Payer: Medicaid Other

## 2022-08-02 ENCOUNTER — Ambulatory Visit (INDEPENDENT_AMBULATORY_CARE_PROVIDER_SITE_OTHER): Payer: Medicaid Other | Admitting: Certified Nurse Midwife

## 2022-08-02 ENCOUNTER — Other Ambulatory Visit: Payer: Self-pay | Admitting: Certified Nurse Midwife

## 2022-08-02 VITALS — BP 99/63 | HR 72 | Wt 143.8 lb

## 2022-08-02 DIAGNOSIS — Z3A27 27 weeks gestation of pregnancy: Secondary | ICD-10-CM

## 2022-08-02 DIAGNOSIS — O99891 Other specified diseases and conditions complicating pregnancy: Secondary | ICD-10-CM

## 2022-08-02 DIAGNOSIS — Z23 Encounter for immunization: Secondary | ICD-10-CM | POA: Diagnosis not present

## 2022-08-02 DIAGNOSIS — Z3A28 28 weeks gestation of pregnancy: Secondary | ICD-10-CM

## 2022-08-02 DIAGNOSIS — L299 Pruritus, unspecified: Secondary | ICD-10-CM

## 2022-08-02 LAB — POCT URINALYSIS DIPSTICK OB
Bilirubin, UA: NEGATIVE
Blood, UA: NEGATIVE
Glucose, UA: NEGATIVE
Ketones, UA: NEGATIVE
Leukocytes, UA: NEGATIVE
Nitrite, UA: NEGATIVE
POC,PROTEIN,UA: NEGATIVE
Spec Grav, UA: 1.02 (ref 1.010–1.025)
Urobilinogen, UA: 0.2 E.U./dL
pH, UA: 6 (ref 5.0–8.0)

## 2022-08-02 NOTE — Addendum Note (Signed)
Addended by: Minette Headland on: 08/02/2022 10:27 AM   Modules accepted: Orders

## 2022-08-02 NOTE — Addendum Note (Signed)
Addended by: Minette Headland on: 08/02/2022 10:16 AM   Modules accepted: Orders

## 2022-08-02 NOTE — Progress Notes (Signed)
ROB doing well. Feels good movement. 28 wk labs today: c/o itchig all over, has rash noted on trunk and extremities. Excoriation marks from scratching. 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 Labs ordered to evaluate. Discussed self help measure for management Glucose screen/RPR/CBC. Tdap, Blood transfusion consent completed, all questions answered. . Sample birth plan given, will follow up in upcoming visits. Discussed birth control after delivery, information pamphlet given.   Follow up 2 wk for ROB or sooner if needed.    Philip Aspen, CNM

## 2022-08-02 NOTE — Patient Instructions (Signed)
Oral Glucose Tolerance Test During Pregnancy Why am I having this test? The oral glucose tolerance test (OGTT) is done to check how your body processes blood sugar (glucose). This is one of several tests used to diagnose diabetes that develops during pregnancy (gestational diabetes mellitus). Gestational diabetes is a short-term form of diabetes that some women develop while they are pregnant. It usually occurs during the second trimester of pregnancy and goes away after delivery. Testing, or screening, for gestational diabetes usually occurs at weeks 24-28 of pregnancy. You may have the OGTT test after having a 1-hour glucose screening test if the results from that test indicate that you may have gestational diabetes. This test may also be needed if: You have a history of gestational diabetes. There is a history of giving birth to very large babies or of losing pregnancies (having stillbirths). You have signs and symptoms of diabetes, such as: Changes in your eyesight. Tingling or numbness in your hands or feet. Changes in hunger, thirst, and urination, and these are not explained by your pregnancy. What is being tested? This test measures the amount of glucose in your blood at different times during a period of 3 hours. This shows how well your body can process glucose. What kind of sample is taken?  Blood samples are required for this test. They are usually collected by inserting a needle into a blood vessel. How do I prepare for this test? For 3 days before your test, eat normally. Have plenty of carbohydrate-rich foods. Follow instructions from your health care provider about: Eating or drinking restrictions on the day of the test. You may be asked not to eat or drink anything other than water (to fast) starting 8-10 hours before the test. Changing or stopping your regular medicines. Some medicines may interfere with this test. Tell a health care provider about: All medicines you are  taking, including vitamins, herbs, eye drops, creams, and over-the-counter medicines. Any blood disorders you have. Any surgeries you have had. Any medical conditions you have. What happens during the test? First, your blood glucose will be measured. This is referred to as your fasting blood glucose because you fasted before the test. Then, you will drink a glucose solution that contains a certain amount of glucose. Your blood glucose will be measured again 1, 2, and 3 hours after you drink the solution. This test takes about 3 hours to complete. You will need to stay at the testing location during this time. During the testing period: Do not eat or drink anything other than the glucose solution. Do not exercise. Do not use any products that contain nicotine or tobacco, such as cigarettes, e-cigarettes, and chewing tobacco. These can affect your test results. If you need help quitting, ask your health care provider. The testing procedure may vary among health care providers and hospitals. How are the results reported? Your results will be reported as milligrams of glucose per deciliter of blood (mg/dL) or millimoles per liter (mmol/L). There is more than one source for screening and diagnosis reference values used to diagnose gestational diabetes. Your health care provider will compare your results to normal values that were established after testing a large group of people (reference values). Reference values may vary among labs and hospitals. For this test (Carpenter-Coustan), reference values are: Fasting: 95 mg/dL (5.3 mmol/L). 1 hour: 180 mg/dL (10.0 mmol/L). 2 hour: 155 mg/dL (8.6 mmol/L). 3 hour: 140 mg/dL (7.8 mmol/L). What do the results mean? Results below the reference values are   considered normal. If two or more of your blood glucose levels are at or above the reference values, you may be diagnosed with gestational diabetes. If only one level is high, your health care provider may  suggest repeat testing or other tests to confirm a diagnosis. Talk with your health care provider about what your results mean. Questions to ask your health care provider Ask your health care provider, or the department that is doing the test: When will my results be ready? How will I get my results? What are my treatment options? What other tests do I need? What are my next steps? Summary The oral glucose tolerance test (OGTT) is one of several tests used to diagnose diabetes that develops during pregnancy (gestational diabetes mellitus). Gestational diabetes is a short-term form of diabetes that some women develop while they are pregnant. You may have the OGTT test after having a 1-hour glucose screening test if the results from that test show that you may have gestational diabetes. You may also have this test if you have any symptoms or risk factors for this type of diabetes. Talk with your health care provider about what your results mean. This information is not intended to replace advice given to you by your health care provider. Make sure you discuss any questions you have with your health care provider. Document Revised: 02/13/2022 Document Reviewed: 12/17/2019 Elsevier Patient Education  2023 Elsevier Inc.  

## 2022-08-03 ENCOUNTER — Encounter: Payer: Self-pay | Admitting: Certified Nurse Midwife

## 2022-08-03 LAB — 28 WEEK RH+PANEL
Basophils Absolute: 0 10*3/uL (ref 0.0–0.2)
Basos: 0 %
EOS (ABSOLUTE): 0.3 10*3/uL (ref 0.0–0.4)
Eos: 5 %
Gestational Diabetes Screen: 94 mg/dL (ref 70–139)
HIV Screen 4th Generation wRfx: NONREACTIVE
Hematocrit: 36 % (ref 34.0–46.6)
Hemoglobin: 12 g/dL (ref 11.1–15.9)
Immature Grans (Abs): 0.1 10*3/uL (ref 0.0–0.1)
Immature Granulocytes: 2 %
Lymphocytes Absolute: 1.9 10*3/uL (ref 0.7–3.1)
Lymphs: 32 %
MCH: 28 pg (ref 26.6–33.0)
MCHC: 33.3 g/dL (ref 31.5–35.7)
MCV: 84 fL (ref 79–97)
Monocytes Absolute: 0.4 10*3/uL (ref 0.1–0.9)
Monocytes: 6 %
Neutrophils Absolute: 3.2 10*3/uL (ref 1.4–7.0)
Neutrophils: 55 %
Platelets: 287 10*3/uL (ref 150–450)
RBC: 4.29 x10E6/uL (ref 3.77–5.28)
RDW: 12.6 % (ref 11.7–15.4)
RPR Ser Ql: NONREACTIVE
WBC: 6 10*3/uL (ref 3.4–10.8)

## 2022-08-04 LAB — COMPREHENSIVE METABOLIC PANEL
ALT: 12 IU/L (ref 0–32)
AST: 15 IU/L (ref 0–40)
Albumin/Globulin Ratio: 1.2 (ref 1.2–2.2)
Albumin: 3.6 g/dL — ABNORMAL LOW (ref 4.0–5.0)
Alkaline Phosphatase: 108 IU/L (ref 44–121)
BUN/Creatinine Ratio: 11 (ref 9–23)
BUN: 5 mg/dL — ABNORMAL LOW (ref 6–20)
Bilirubin Total: 0.2 mg/dL (ref 0.0–1.2)
CO2: 20 mmol/L (ref 20–29)
Calcium: 8.6 mg/dL — ABNORMAL LOW (ref 8.7–10.2)
Chloride: 105 mmol/L (ref 96–106)
Creatinine, Ser: 0.44 mg/dL — ABNORMAL LOW (ref 0.57–1.00)
Globulin, Total: 3.1 g/dL (ref 1.5–4.5)
Glucose: 82 mg/dL (ref 70–99)
Potassium: 3.7 mmol/L (ref 3.5–5.2)
Sodium: 138 mmol/L (ref 134–144)
Total Protein: 6.7 g/dL (ref 6.0–8.5)
eGFR: 138 mL/min/{1.73_m2} (ref >59)

## 2022-08-04 LAB — BILE ACIDS, TOTAL: Bile Acids Total: 9.6 umol/L (ref 0.0–10.0)

## 2022-08-06 ENCOUNTER — Encounter: Payer: Self-pay | Admitting: Certified Nurse Midwife

## 2022-08-16 ENCOUNTER — Ambulatory Visit (INDEPENDENT_AMBULATORY_CARE_PROVIDER_SITE_OTHER): Payer: Medicaid Other | Admitting: Obstetrics and Gynecology

## 2022-08-16 ENCOUNTER — Encounter: Payer: Self-pay | Admitting: Obstetrics and Gynecology

## 2022-08-16 VITALS — BP 95/59 | HR 63 | Wt 143.5 lb

## 2022-08-16 DIAGNOSIS — Z3483 Encounter for supervision of other normal pregnancy, third trimester: Secondary | ICD-10-CM

## 2022-08-16 DIAGNOSIS — Z3A29 29 weeks gestation of pregnancy: Secondary | ICD-10-CM

## 2022-08-16 DIAGNOSIS — L299 Pruritus, unspecified: Secondary | ICD-10-CM

## 2022-08-16 LAB — POCT URINALYSIS DIPSTICK OB
Bilirubin, UA: NEGATIVE
Blood, UA: NEGATIVE
Glucose, UA: NEGATIVE
Ketones, UA: NEGATIVE
Leukocytes, UA: NEGATIVE
Nitrite, UA: NEGATIVE
POC,PROTEIN,UA: NEGATIVE
Spec Grav, UA: 1.02 (ref 1.010–1.025)
Urobilinogen, UA: 0.2 E.U./dL
pH, UA: 6.5 (ref 5.0–8.0)

## 2022-08-16 MED ORDER — HYDROXYZINE HCL 25 MG PO TABS: 25.0000 mg | ORAL_TABLET | Freq: Four times a day (QID) | ORAL | 2 refills | Status: DC | PRN

## 2022-08-16 NOTE — Addendum Note (Signed)
Addended by: Marykay Lex on: 08/16/2022 10:25 AM   Modules accepted: Orders

## 2022-08-16 NOTE — Progress Notes (Signed)
ROB: Patient generally doing well but continues to complain of worsening itching.  Has tried Claritin without success.  Previous bile acid salts within normal range. Plan: Start Atarax Repeat bile acid labs in 2 weeks.

## 2022-08-16 NOTE — Progress Notes (Signed)
Gina Lester. Patient states daily fetal movement along with pain and pressure. She states she is still experiencing itching on her belly and tops of her hands. Patient states no questions or concerns at this time.

## 2022-08-30 ENCOUNTER — Encounter: Payer: Medicaid Other | Admitting: Obstetrics and Gynecology

## 2022-08-30 ENCOUNTER — Telehealth: Payer: Self-pay | Admitting: Obstetrics and Gynecology

## 2022-08-30 DIAGNOSIS — Z3A31 31 weeks gestation of pregnancy: Secondary | ICD-10-CM

## 2022-08-30 DIAGNOSIS — Z3483 Encounter for supervision of other normal pregnancy, third trimester: Secondary | ICD-10-CM

## 2022-08-30 DIAGNOSIS — Z3480 Encounter for supervision of other normal pregnancy, unspecified trimester: Secondary | ICD-10-CM

## 2022-08-30 NOTE — Telephone Encounter (Signed)
Reached out to pt to reschedule the ROB appt that was scheduled for 08/30/2022 at 9:55 with Dr. Marcelline Mates. Could not leave a message.  Mailbox not set up.

## 2022-08-31 ENCOUNTER — Encounter: Payer: Self-pay | Admitting: Obstetrics and Gynecology

## 2022-08-31 NOTE — Telephone Encounter (Signed)
Reached out to pt (2x) to reschedule the ROB appt that was scheduled for 08/30/2022 at 9:55 with Dr. Marcelline Mates.  Could not leave a message.  Mailbox was not set up.  Will send a MyChart letter.

## 2022-09-24 ENCOUNTER — Encounter: Payer: Medicaid Other | Admitting: Obstetrics

## 2022-10-02 ENCOUNTER — Encounter: Payer: Self-pay | Admitting: Certified Nurse Midwife

## 2022-10-04 ENCOUNTER — Ambulatory Visit (INDEPENDENT_AMBULATORY_CARE_PROVIDER_SITE_OTHER): Payer: Medicaid Other | Admitting: Advanced Practice Midwife

## 2022-10-04 ENCOUNTER — Other Ambulatory Visit (HOSPITAL_COMMUNITY)
Admission: RE | Admit: 2022-10-04 | Discharge: 2022-10-04 | Disposition: A | Discharge status: Home / Self Care | Payer: Medicaid Other | Source: Ambulatory Visit | Attending: Obstetrics and Gynecology | Admitting: Obstetrics and Gynecology

## 2022-10-04 ENCOUNTER — Encounter: Payer: Self-pay | Admitting: Advanced Practice Midwife

## 2022-10-04 VITALS — BP 96/71 | HR 73 | Wt 154.0 lb

## 2022-10-04 DIAGNOSIS — Z113 Encounter for screening for infections with a predominantly sexual mode of transmission: Secondary | ICD-10-CM | POA: Diagnosis present

## 2022-10-04 DIAGNOSIS — Z3685 Encounter for antenatal screening for Streptococcus B: Secondary | ICD-10-CM

## 2022-10-04 DIAGNOSIS — Z3483 Encounter for supervision of other normal pregnancy, third trimester: Secondary | ICD-10-CM | POA: Diagnosis present

## 2022-10-04 DIAGNOSIS — Z369 Encounter for antenatal screening, unspecified: Secondary | ICD-10-CM | POA: Diagnosis present

## 2022-10-04 DIAGNOSIS — Z3A36 36 weeks gestation of pregnancy: Secondary | ICD-10-CM

## 2022-10-04 DIAGNOSIS — Z3480 Encounter for supervision of other normal pregnancy, unspecified trimester: Secondary | ICD-10-CM

## 2022-10-04 LAB — POCT URINALYSIS DIPSTICK
Bilirubin, UA: NEGATIVE
Blood, UA: NEGATIVE
Glucose, UA: NEGATIVE
Ketones, UA: NEGATIVE
Leukocytes, UA: NEGATIVE
Nitrite, UA: NEGATIVE
Protein, UA: NEGATIVE
Spec Grav, UA: 1.01 (ref 1.010–1.025)
Urobilinogen, UA: 1 E.U./dL
pH, UA: 6 (ref 5.0–8.0)

## 2022-10-04 NOTE — Patient Instructions (Signed)
Pain Relief During Labor and Delivery Many things can cause pain during labor and delivery, including: Pressure due to the baby moving through the pelvis. Stretching of tissues due to the baby moving through the birth canal. Muscle tension due to anxiety or nervousness. The uterus tightening (contracting)and relaxing to help move the baby. How do I get pain relief during labor and delivery?  Discuss your pain relief options with your health care provider during your prenatal visits. Explore the options offered by your hospital or birth center. There are many ways to deal with the pain of labor and delivery. You can try relaxation techniques or doing relaxing activities, taking a warm shower or bath (hydrotherapy), or other methods. There are also many medicines available to help control pain. Relaxation techniques and activities Practice relaxation techniques or do relaxing activities, such as: Focused breathing. Meditation. Visualization. Aroma therapy. Listening to your favorite music. Hypnosis. Hydrotherapy Take a warm shower or bath. This may: Provide comfort and relaxation. Lessen your feeling of pain. Reduce the amount of pain medicine needed. Shorten the length of labor. Other methods Try doing other things, such as: Getting a massage or having counterpressure on your back. Applying warm packs or ice packs. Changing positions often, moving around, or using a birthing ball. Medicines You may be given: Pain medicine through an IV or an injection into a muscle. Pain medicine inserted into your spinal column. Injections of sterile water just under the skin on your lower back. Nitrous oxide inhalation therapy, also called laughing gas. What kinds of medicine are available for pain relief? There are two kinds of medicines that can be used to relieve pain during labor and delivery: Analgesics. These medicines decrease pain without causing you to lose feeling or the ability to move  your muscles. Anesthetics. These medicines block feeling in the body and can decrease your ability to move freely. Both kinds of medicine can cause minor side effects, such as nausea, trouble concentrating, and sleepiness. They can also affect the baby's heart rate before birth and his or her breathing after birth. For this reason, health care providers are careful about when and how much medicine is given. Which medicines are used to provide pain relief? Common medicines The most common medicines used to help manage pain during labor and delivery include: Opioids. Opioids are medicines that decrease how much pain you feel (perception of pain). These medicines can be given through an IV or may be used with anesthetics to block pain. Epidural analgesia. Epidural analgesia is given through a very thin tube that is inserted into the lower back. Medicine is delivered continuously to the area near your spinal column nerves (epidural space). After having this treatment, you may be able to move your legs, but you will not be able to walk. Depending on the amount and type of medicine given, you may lose all feeling in the lower half of your body, or you may have some sensation, including the urge to push. This treatment can be used to give pain relief for a vaginal birth. Sometimes, a numbing medicine is injected into the spinal fluid when an epidural catheter is placed. This provides for immediate relief but only lasts for 1-2 hours. Once it wears off, the epidural will provide pain relief. This is called a combined spinal-epidural (CSE) block. Intrathecal analgesia (spinal analgesia). Intrathecal analgesia is similar to epidural analgesia, but the medicine is injected into the spinal fluid instead of the epidural space. It is usually only given once.   It starts to relieve pain quickly, but the pain relief lasts only 1-2 hours. Pudendal block. This block is done by injecting numbing medicine through the wall of  the vagina and into a nerve in the pelvis. Other medicines Other medicines used to help manage pain during labor and delivery include: Local anesthetics. These are used to numb a small area of the body. They may be used along with another kind of medicine or used to numb the nerves of the vagina, cervix, and perineum during the second stage of labor. Spinal block (spinal anesthesia). Spinal anesthesia is similar to spinal analgesia, but the medicine that is used contains longer-acting numbing medicines and pain medicines. This type of anesthesia can be used for a cesarean delivery and allows you to stay awake for the birth of your baby. General anesthetics cause you to lose consciousness so you do not feel pain. They are usually only used for an emergency cesarean delivery. These medicines are given through an IV or a mask or both. These medicines are used as part of a procedure or for an emergency delivery. Summary Women have many options to help them manage the pain associated with labor and delivery. You can try doing relaxing activities, taking a warm shower or bath, or other methods. There are also many medicines available to help control pain during labor and delivery. Talk with your health care provider about what options are available to you. This information is not intended to replace advice given to you by your health care provider. Make sure you discuss any questions you have with your health care provider. Document Revised: 05/27/2019 Document Reviewed: 05/27/2019 Elsevier Patient Education  2023 Elsevier Inc. Signs and Symptoms of Labor Labor is the body's natural process of moving the baby and the placenta out of the uterus. The process of labor usually starts when the baby is full-term, between 39 and 41 weeks of pregnancy. Signs and symptoms that you are close to going into labor As your body prepares for labor and the birth of your baby, you may notice the following symptoms in  the weeks and days before true labor starts: Passing a small amount of thick, bloody mucus from your vagina. This is called normal bloody show or losing your mucus plug. This may happen more than a week before labor begins, or right before labor begins, as the opening of the cervix starts to widen (dilate). For some women, the entire mucus plug passes at once. For others, pieces of the mucus plug may gradually pass over several days. Your baby moving (dropping) lower in your pelvis to get into position for birth (lightening). When this happens, you may feel more pressure on your bladder and pelvic bone and less pressure on your ribs. This may make it easier to breathe. It may also cause you to need to urinate more often and have problems with bowel movements. Having "practice contractions," also called Braxton Hicks contractions or false labor. These occur at irregular (unevenly spaced) intervals that are more than 10 minutes apart. False labor contractions are common after exercise or sexual activity. They will stop if you change position, rest, or drink fluids. These contractions are usually mild and do not get stronger over time. They may feel like: A backache or back pain. Mild cramps, similar to menstrual cramps. Tightening or pressure in your abdomen. Other early symptoms include: Nausea or loss of appetite. Diarrhea. Having a sudden burst of energy, or feeling very tired. Mood changes. Having trouble   sleeping. Signs and symptoms that labor has begun Signs that you are in labor may include: Having contractions that come at regular (evenly spaced) intervals and increase in intensity. This may feel like more intense tightening or pressure in your abdomen that moves to your back. Contractions may also feel like rhythmic pain in your upper thighs or back that comes and goes at regular intervals. If you are delivering for the first time, this change in intensity of contractions often occurs at a  more gradual pace. If you have given birth before, you may notice a more rapid progression of contraction changes. Feeling pressure in the vaginal area. Your water breaking (rupture of membranes). This is when the sac of fluid that surrounds your baby breaks. Fluid leaking from your vagina may be clear or blood-tinged. Labor usually starts within 24 hours of your water breaking, but it may take longer to begin. Some people may feel a sudden gush of fluid; others may notice repeatedly damp underwear. Follow these instructions at home:  When labor starts, or if your water breaks, call your health care provider or nurse care line. Based on your situation, they will determine when you should go in for an exam. During early labor, you may be able to rest and manage symptoms at home. Some strategies to try at home include: Breathing and relaxation techniques. Taking a warm bath or shower. Listening to music. Using a heating pad on the lower back for pain. If directed, apply heat to the area as often as told by your health care provider. Use the heat source that your health care provider recommends, such as a moist heat pack or a heating pad. Place a towel between your skin and the heat source. Leave the heat on for 20-30 minutes. Remove the heat if your skin turns bright red. This is especially important if you are unable to feel pain, heat, or cold. You have a greater risk of getting burned. Contact a health care provider if: Your labor has started. Your water breaks. You have nausea, vomiting, or diarrhea. Get help right away if: You have painful, regular contractions that are 5 minutes apart or less. Labor starts before you are [redacted] weeks along in your pregnancy. You have a fever. You have bright red blood coming from your vagina. You do not feel your baby moving. You have a severe headache with or without vision problems. You have chest pain or shortness of breath. These symptoms may  represent a serious problem that is an emergency. Do not wait to see if the symptoms will go away. Get medical help right away. Call your local emergency services (911 in the [place] Do not drive yourself to the hospital. Summary Labor is your body's natural process of moving your baby and the placenta out of your uterus. The process of labor usually starts when your baby is full-term, between 39 and 40 weeks of pregnancy. When labor starts, or if your water breaks, call your health care provider or nurse care line. Based on your situation, they will determine when you should go in for an exam. This information is not intended to replace advice given to you by your health care provider. Make sure you discuss any questions you have with your health care provider. Document Revised: 11/22/2020 Document Reviewed: 11/22/2020 Elsevier Patient Education  2023 Elsevier Inc.  

## 2022-10-04 NOTE — Addendum Note (Signed)
Addended by: Inis Sizer on: 10/04/2022 08:46 AM   Modules accepted: Orders

## 2022-10-04 NOTE — Progress Notes (Addendum)
Routine Prenatal Care Visit  Subjective  Gina Lester is a 27 y.o. [phone] at [redacted]w[redacted]d being seen today for ongoing prenatal care.  She is currently monitored for the following issues for this low-risk pregnancy and has Supervision of other normal pregnancy, antepartum on their problem list.  ----------------------------------------------------------------------------------- Patient reports  new hemorrhoid with bleeding. Reviewed care recommendations .   Contractions: Not present. Vag. Bleeding: None.  Movement: Present. Leaking Fluid denies.  ----------------------------------------------------------------------------------- The following portions of the patient's history were reviewed and updated as appropriate: allergies, current medications, past family history, past medical history, past social history, past surgical history and problem list. Problem list updated.  Objective  Blood pressure 96/71, pulse 73, weight 154 lb (69.9 kg), last menstrual period 01/23/2022. Pregravid weight 143 lb (64.9 kg) Total Weight Gain 11 lb (4.99 kg) Urinalysis: Urine Protein    Urine Glucose    Fetal Status: Fetal Heart Rate (bpm): 124 Fundal Height: 36 cm Movement: Present     General:  Alert, oriented and cooperative. Patient is in no acute distress.  Skin: Skin is warm and dry. No rash noted.   Cardiovascular: Normal heart rate noted  Respiratory: Normal respiratory effort, no problems with respiration noted  Abdomen: Soft, gravid, appropriate for gestational age. Pain/Pressure: Absent     Pelvic:   GBS/aptima collected, cervical exam declined         Extremities: Normal range of motion.  Edema: None  Mental Status: Normal mood and affect. Normal behavior. Normal judgment and thought content.   Assessment   27 y.o. [id number] at [redacted]w[redacted]d by  10/30/2022, by Last Menstrual Period presenting for routine prenatal visit  Plan     Preterm labor symptoms and general obstetric precautions including but  not limited to vaginal bleeding, contractions, leaking of fluid and fetal movement were reviewed in detail with the patient. Please refer to After Visit Summary for other counseling recommendations.   Return in about 1 week (around 10/11/2022) for rob.  Rod Can, CNM 10/08/2022 9:41 AM

## 2022-10-05 LAB — CERVICOVAGINAL ANCILLARY ONLY
Chlamydia: NEGATIVE
Comment: NEGATIVE
Comment: NEGATIVE
Comment: NORMAL
Neisseria Gonorrhea: NEGATIVE
Trichomonas: NEGATIVE

## 2022-10-06 LAB — STREP GP B NAA: Strep Gp B NAA: NEGATIVE

## 2022-10-08 ENCOUNTER — Encounter: Payer: Self-pay | Admitting: Advanced Practice Midwife

## 2022-10-08 ENCOUNTER — Ambulatory Visit (INDEPENDENT_AMBULATORY_CARE_PROVIDER_SITE_OTHER): Payer: Medicaid Other | Admitting: Advanced Practice Midwife

## 2022-10-08 VITALS — BP 89/70 | HR 85 | Wt 154.3 lb

## 2022-10-08 DIAGNOSIS — Z3480 Encounter for supervision of other normal pregnancy, unspecified trimester: Secondary | ICD-10-CM

## 2022-10-08 DIAGNOSIS — Z3A36 36 weeks gestation of pregnancy: Secondary | ICD-10-CM

## 2022-10-08 DIAGNOSIS — Z3483 Encounter for supervision of other normal pregnancy, third trimester: Secondary | ICD-10-CM

## 2022-10-08 LAB — POCT URINALYSIS DIPSTICK
Bilirubin, UA: NEGATIVE
Blood, UA: NEGATIVE
Glucose, UA: NEGATIVE
Ketones, UA: NEGATIVE
Leukocytes, UA: NEGATIVE
Nitrite, UA: NEGATIVE
Protein, UA: NEGATIVE
Spec Grav, UA: 1.025 (ref 1.010–1.025)
Urobilinogen, UA: 0.2 E.U./dL
pH, UA: 7.5 (ref 5.0–8.0)

## 2022-10-08 NOTE — Progress Notes (Addendum)
Routine Prenatal Care Visit  Subjective  Gina Lester is a 27 y.o. [phone] at [redacted]w[redacted]d being seen today for ongoing prenatal care.  She is currently monitored for the following issues for this low-risk pregnancy and has Supervision of other normal pregnancy, antepartum on their problem list.  ----------------------------------------------------------------------------------- Patient reports being "aware of blood pressure dropping because I can feel my heart beating out of my chest while sitting down and I'll get tired". Blood pressure was low today (89/70), patient not currently symptomatic. Advised to increase fluid intake. Contractions: Not present. Vag. Bleeding: None.  Movement: Present. Leaking Fluid denies.  ----------------------------------------------------------------------------------- The following portions of the patient's history were reviewed and updated as appropriate: allergies, current medications, past family history, past medical history, past social history, past surgical history and problem list. Problem list updated.  Objective  Blood pressure (!) 89/70, pulse 85, weight 154 lb 4.8 oz (70 kg), last menstrual period 01/23/2022. Pregravid weight 143 lb (64.9 kg) Total Weight Gain 11 lb 4.8 oz (5.126 kg) Urinalysis: Urine Protein    Urine Glucose    Fetal Status: Fetal Heart Rate (bpm): 145 Fundal Height: 35 cm Movement: Present     General:  Alert, oriented and cooperative. Patient is in no acute distress.  Skin: Skin is warm and dry. No rash noted.   Cardiovascular: Normal heart rate noted  Respiratory: Normal respiratory effort, no problems with respiration noted  Abdomen: Soft, gravid, appropriate for gestational age. Pain/Pressure: Absent     Pelvic:  Cervical exam deferred        Extremities: Normal range of motion.  Edema: None  Mental Status: Normal mood and affect. Normal behavior. Normal judgment and thought content.   Assessment   27 y.o. [id number] at  [redacted]w[redacted]d by  10/30/2022, by Last Menstrual Period presenting for routine prenatal visit  Plan   fourth Problems (from 03/22/22 to present)     Problem Noted Resolved   Supervision of other normal pregnancy, antepartum 03/22/2022 by Cleophas Dunker, Sandy No   Overview Addendum 10/08/2022  9:09 AM by Rod Can, CNM     Clinical Staff Provider  Office Location  [facility] Dating  10/30/2022, by Last Menstrual Period  Language  English Anatomy US  Normal female  Flu Vaccine  unsure Genetic Screen  NIPS: Negative/Female: 08/02/2022  TDaP vaccine   08/02/2022 Hgb A1C or  GTT Early : Third trimester : 94 08/02/2022  Covid unsure   LAB RESULTS   Rhogam  O/Positive/-- (09/12 0000)  Blood Type O/Positive/-- (09/12 0000)   Feeding Plan breast Antibody Negative (09/12 0000)  Contraception unsure Rubella 2.04 (09/12 0000)  Circumcision no RPR Non Reactive (01/11 1019)   Pediatrician  Kids care HBsAg Negative (09/12 0000)   Support Person devin HIV Non Reactive (01/11 1019)  Prenatal Classes no Varicella     GBS Negative/-- (03/14 0845)(For PCN allergy, check sensitivities)   BTL Consent  Hep C Non Reactive (09/12 0000)   VBAC Consent  Pap Diagnosis  Date Value Ref Range Status  05/08/2022 - Low grade squamous intraepithelial lesion (LSIL) (A)  Final      Hgb Electro      CF      SMA                    Preterm labor symptoms and general obstetric precautions including but not limited to vaginal bleeding, contractions, leaking of fluid and fetal movement were reviewed in detail with the patient.  Discussed hemorrhoids  addressed at previous visit. Patient has obtained some OTC cream and has achieved some relief with decrease in bleeding from the hemorrhoid. Patient does note some occasional BH ctx, but nothing regular yet. Encouraged to increase water intake, physical activity, and fiber intake.  Please refer to After Visit Summary for other counseling recommendations.   Patient seen by CNM  and SCNM, Constance Goltz  Return in about 1 week (around 10/15/2022) for rob.  Rod Can, CNM 10/08/2022 9:31 AM

## 2022-10-18 ENCOUNTER — Ambulatory Visit (INDEPENDENT_AMBULATORY_CARE_PROVIDER_SITE_OTHER): Payer: Medicaid Other | Admitting: Obstetrics and Gynecology

## 2022-10-18 ENCOUNTER — Encounter: Payer: Self-pay | Admitting: Obstetrics and Gynecology

## 2022-10-18 VITALS — BP 98/61 | HR 93 | Wt 158.4 lb

## 2022-10-18 DIAGNOSIS — Z3480 Encounter for supervision of other normal pregnancy, unspecified trimester: Secondary | ICD-10-CM

## 2022-10-18 DIAGNOSIS — Z3483 Encounter for supervision of other normal pregnancy, third trimester: Secondary | ICD-10-CM

## 2022-10-18 DIAGNOSIS — Z3A38 38 weeks gestation of pregnancy: Secondary | ICD-10-CM

## 2022-10-18 DIAGNOSIS — O479 False labor, unspecified: Secondary | ICD-10-CM

## 2022-10-18 LAB — POCT URINALYSIS DIPSTICK OB
Bilirubin, UA: NEGATIVE
Blood, UA: NEGATIVE
Glucose, UA: NEGATIVE
Ketones, UA: NEGATIVE
Leukocytes, UA: NEGATIVE
Nitrite, UA: NEGATIVE
Spec Grav, UA: 1.015 (ref 1.010–1.025)
Urobilinogen, UA: 0.2 E.U./dL
pH, UA: 7 (ref 5.0–8.0)

## 2022-10-18 NOTE — Progress Notes (Signed)
ROB [redacted]w[redacted]d: She is doing well. She has been having lots of pelvic pressure when she walks. She reports good fetal movement. She does not have any new concerns today.

## 2022-10-18 NOTE — Progress Notes (Signed)
ROB: Patient is a 27 y.o. [id number] at [redacted]w[redacted]d who presents for routine OB care.  Pregnancy is uncomplicated. Patient has complaints of more pain and pressure. Also having more [employer].  Discussed comfort measures. Reviewed labor precautions. RTC in 1 week.

## 2022-10-19 ENCOUNTER — Encounter: Payer: Self-pay | Admitting: Obstetrics and Gynecology

## 2022-10-19 DIAGNOSIS — O99320 Drug use complicating pregnancy, unspecified trimester: Secondary | ICD-10-CM

## 2022-10-19 HISTORY — DX: Drug use complicating pregnancy, unspecified trimester: O99.320

## 2022-10-25 ENCOUNTER — Encounter: Payer: Self-pay | Admitting: Obstetrics and Gynecology

## 2022-10-25 ENCOUNTER — Ambulatory Visit (INDEPENDENT_AMBULATORY_CARE_PROVIDER_SITE_OTHER): Payer: Medicaid Other | Admitting: Obstetrics and Gynecology

## 2022-10-25 VITALS — BP 97/62 | HR 78 | Wt 158.8 lb

## 2022-10-25 DIAGNOSIS — Z3480 Encounter for supervision of other normal pregnancy, unspecified trimester: Secondary | ICD-10-CM

## 2022-10-25 DIAGNOSIS — Z3483 Encounter for supervision of other normal pregnancy, third trimester: Secondary | ICD-10-CM

## 2022-10-25 DIAGNOSIS — Z3A39 39 weeks gestation of pregnancy: Secondary | ICD-10-CM

## 2022-10-25 LAB — POCT URINALYSIS DIPSTICK OB
Bilirubin, UA: NEGATIVE
Blood, UA: NEGATIVE
Glucose, UA: NEGATIVE
Ketones, UA: NEGATIVE
Leukocytes, UA: NEGATIVE
Nitrite, UA: NEGATIVE
POC,PROTEIN,UA: NEGATIVE
Spec Grav, UA: 1.015 (ref 1.010–1.025)
Urobilinogen, UA: 0.2 E.U./dL
pH, UA: 6 (ref 5.0–8.0)

## 2022-10-25 NOTE — Progress Notes (Signed)
ROB. Patient states daily fetal movement along with braxton hicks. She would like a cervical check today. Patient would like to discuss labor options, she does not want to go over 40 weeks at this time.

## 2022-10-25 NOTE — Progress Notes (Signed)
ROB: Not having any significant contractions.  Reports daily fetal movement.  BPP scheduled for 1 week.  Induction scheduled for 4/17 5 AM.

## 2022-11-01 ENCOUNTER — Encounter: Payer: Self-pay | Admitting: Obstetrics and Gynecology

## 2022-11-01 ENCOUNTER — Ambulatory Visit (INDEPENDENT_AMBULATORY_CARE_PROVIDER_SITE_OTHER): Payer: Medicaid Other

## 2022-11-01 ENCOUNTER — Encounter: Payer: Self-pay | Admitting: Obstetrics

## 2022-11-01 ENCOUNTER — Other Ambulatory Visit: Payer: Self-pay

## 2022-11-01 ENCOUNTER — Inpatient Hospital Stay
Admission: EM | Admit: 2022-11-01 | Discharge: 2022-11-03 | DRG: 806 | Disposition: A | Discharge status: Home / Self Care | Payer: Medicaid Other | Attending: Licensed Practical Nurse | Admitting: Licensed Practical Nurse

## 2022-11-01 ENCOUNTER — Ambulatory Visit (INDEPENDENT_AMBULATORY_CARE_PROVIDER_SITE_OTHER): Payer: Medicaid Other | Admitting: Obstetrics

## 2022-11-01 VITALS — BP 108/66 | HR 66 | Wt 163.2 lb

## 2022-11-01 DIAGNOSIS — O99320 Drug use complicating pregnancy, unspecified trimester: Secondary | ICD-10-CM

## 2022-11-01 DIAGNOSIS — D62 Acute posthemorrhagic anemia: Secondary | ICD-10-CM | POA: Diagnosis not present

## 2022-11-01 DIAGNOSIS — Z3A40 40 weeks gestation of pregnancy: Secondary | ICD-10-CM | POA: Diagnosis not present

## 2022-11-01 DIAGNOSIS — O4103X0 Oligohydramnios, third trimester, not applicable or unspecified: Secondary | ICD-10-CM | POA: Diagnosis present

## 2022-11-01 DIAGNOSIS — O9902 Anemia complicating childbirth: Secondary | ICD-10-CM | POA: Diagnosis present

## 2022-11-01 DIAGNOSIS — O480 Post-term pregnancy: Secondary | ICD-10-CM

## 2022-11-01 DIAGNOSIS — Z3A39 39 weeks gestation of pregnancy: Secondary | ICD-10-CM

## 2022-11-01 DIAGNOSIS — O4100X0 Oligohydramnios, unspecified trimester, not applicable or unspecified: Secondary | ICD-10-CM | POA: Diagnosis present

## 2022-11-01 DIAGNOSIS — O9903 Anemia complicating the puerperium: Secondary | ICD-10-CM | POA: Diagnosis not present

## 2022-11-01 DIAGNOSIS — Z23 Encounter for immunization: Secondary | ICD-10-CM

## 2022-11-01 DIAGNOSIS — O09899 Supervision of other high risk pregnancies, unspecified trimester: Secondary | ICD-10-CM

## 2022-11-01 DIAGNOSIS — Z3480 Encounter for supervision of other normal pregnancy, unspecified trimester: Principal | ICD-10-CM

## 2022-11-01 DIAGNOSIS — F1290 Cannabis use, unspecified, uncomplicated: Secondary | ICD-10-CM

## 2022-11-01 DIAGNOSIS — O99323 Drug use complicating pregnancy, third trimester: Secondary | ICD-10-CM

## 2022-11-01 HISTORY — DX: Oligohydramnios, unspecified trimester, not applicable or unspecified: O41.00X0

## 2022-11-01 LAB — POCT URINALYSIS DIPSTICK OB
Bilirubin, UA: NEGATIVE
Blood, UA: NEGATIVE
Glucose, UA: NEGATIVE
Ketones, UA: NEGATIVE
Leukocytes, UA: NEGATIVE
Nitrite, UA: NEGATIVE
POC,PROTEIN,UA: NEGATIVE
Spec Grav, UA: 1.015 (ref 1.010–1.025)
Urobilinogen, UA: 0.2 E.U./dL
pH, UA: 6.5 (ref 5.0–8.0)

## 2022-11-01 LAB — CBC
HCT: 35.4 % — ABNORMAL LOW (ref 36.0–46.0)
Hemoglobin: 11.2 g/dL — ABNORMAL LOW (ref 12.0–15.0)
MCH: 23.7 pg — ABNORMAL LOW (ref 26.0–34.0)
MCHC: 31.6 g/dL (ref 30.0–36.0)
MCV: 74.8 fL — ABNORMAL LOW (ref 80.0–100.0)
Platelets: 304 10*3/uL (ref 150–400)
RBC: 4.73 MIL/uL (ref 3.87–5.11)
RDW: 15.9 % — ABNORMAL HIGH (ref 11.5–15.5)
WBC: 10.6 10*3/uL — ABNORMAL HIGH (ref 4.0–10.5)
nRBC: 0 % (ref 0.0–0.2)

## 2022-11-01 LAB — BPAM RBC: Unit Type and Rh: 5100

## 2022-11-01 LAB — TYPE AND SCREEN

## 2022-11-01 MED ORDER — LIDOCAINE HCL (PF) 1 % IJ SOLN: 30.0000 mL | INTRAMUSCULAR | Status: DC | PRN

## 2022-11-01 MED ORDER — MISOPROSTOL 200 MCG PO TABS
ORAL_TABLET | ORAL | Status: AC
  Filled 2022-11-01: qty 4

## 2022-11-01 MED ORDER — LACTATED RINGERS IV SOLN: INTRAVENOUS | Status: DC

## 2022-11-01 MED ORDER — LIDOCAINE HCL (PF) 1 % IJ SOLN
INTRAMUSCULAR | Status: AC
  Filled 2022-11-01: qty 30

## 2022-11-01 MED ORDER — OXYTOCIN 10 UNIT/ML IJ SOLN
INTRAMUSCULAR | Status: AC
  Filled 2022-11-01: qty 2

## 2022-11-01 MED ORDER — FENTANYL CITRATE (PF) 100 MCG/2ML IJ SOLN: 50.0000 ug | INTRAMUSCULAR | Status: DC | PRN

## 2022-11-01 MED ORDER — ONDANSETRON HCL 4 MG/2ML IJ SOLN: 4.0000 mg | Freq: Four times a day (QID) | INTRAMUSCULAR | Status: DC | PRN

## 2022-11-01 MED ORDER — AMMONIA AROMATIC IN INHA
RESPIRATORY_TRACT | Status: AC
  Filled 2022-11-01: qty 10

## 2022-11-01 MED ORDER — OXYTOCIN BOLUS FROM INFUSION
333.0000 mL | Freq: Once | INTRAVENOUS | Status: AC
  Administered 2022-11-02: 333 mL via INTRAVENOUS

## 2022-11-01 MED ORDER — LACTATED RINGERS IV SOLN: 500.0000 mL | INTRAVENOUS | Status: DC | PRN

## 2022-11-01 MED ORDER — SOD CITRATE-CITRIC ACID 500-334 MG/5ML PO SOLN: 30.0000 mL | ORAL | Status: DC | PRN

## 2022-11-01 MED ORDER — OXYTOCIN-SODIUM CHLORIDE 30-0.9 UT/500ML-% IV SOLN
1.0000 m[IU]/min | INTRAVENOUS | Status: DC
  Administered 2022-11-01: 2 m[IU]/min via INTRAVENOUS

## 2022-11-01 MED ORDER — TERBUTALINE SULFATE 1 MG/ML IJ SOLN: 0.2500 mg | Freq: Once | INTRAMUSCULAR | Status: DC | PRN

## 2022-11-01 MED ORDER — OXYTOCIN-SODIUM CHLORIDE 30-0.9 UT/500ML-% IV SOLN
2.5000 [IU]/h | INTRAVENOUS | Status: DC
  Administered 2022-11-02: 2.5 [IU]/h via INTRAVENOUS
  Filled 2022-11-01: qty 1000

## 2022-11-01 NOTE — Progress Notes (Signed)
   Subjective:  Has just started feeling more intense contractions. Discussed her VE is unchanged, but she contracting more painfully offered to see what happens over the next 2 hours or start Pitocin, pt prefers to start Pitocin.   Objective:   Vitals: Blood pressure 111/77, pulse 74, temperature 97.9 F (36.6 C), temperature source Oral, resp. rate 16, weight 74 kg, last menstrual period 01/23/2022. General: NAD Abdomen:non tender  Cervical Exam:  Dilation: 3 Effacement (%): 70 Station: -2 Presentation: Vertex Exam by:: Sendy Pluta, CNM  FHT: baseline 145, moderate variability, pos accel, 1 isolated variable Toco:q 1.5-3.5  Results for orders placed or performed during the hospital encounter of 11/01/22 (from the past 24 hour(s))  CBC     Status: Abnormal   Collection Time: 11/01/22  2:36 PM  Result Value Ref Range   WBC 10.6 (H) 4.0 - 10.5 K/uL   RBC 4.73 3.87 - 5.11 MIL/uL   Hemoglobin 11.2 (L) 12.0 - 15.0 g/dL   HCT 17.7 (L) 11.6 - 57.9 %   MCV 74.8 (L) 80.0 - 100.0 fL   MCH 23.7 (L) 26.0 - 34.0 pg   MCHC 31.6 30.0 - 36.0 g/dL   RDW 03.8 (H) 33.3 - 83.2 %   Platelets 304 150 - 400 K/uL   nRBC 0.0 0.0 - 0.2 %  Type and screen [facility]     Status: None (Preliminary result)   Collection Time: 11/01/22  2:36 PM  Result Value Ref Range   ABO/RH(D) O POS    Antibody Screen POS    Sample Expiration 11/04/2022,2359    Antibody Identification ANTI JKA (Kidd a)    Antibody Titer (2) PENDING    Unit Number [id number]    Blood Component Type RED CELLS,LR    Unit division 00    Status of Unit ALLOCATED    Transfusion Status OK TO TRANSFUSE    Crossmatch Result COMPATIBLE    Unit Number [id number]    Blood Component Type RED CELLS,LR    Unit division 00    Status of Unit ALLOCATED    Transfusion Status OK TO TRANSFUSE    Crossmatch Result COMPATIBLE     Assessment:   27 y.o. [id number] [redacted]w[redacted]d admitted for IOL secondary to oligohydramnios  Plan:    1) Labor -Pitocin ordered   2) Fetus - category 1 tracing  3) GBS negative, Membranes intact  4) Pain Management: aware of all options, will ask if desired.   Carie Caddy, CNM   [facility]  11/02/2022 6:40 PM

## 2022-11-01 NOTE — Progress Notes (Signed)
   Subjective:  Feeling regular intense contractions, partner at her side.   Objective:   Vitals: Blood pressure 126/72, pulse 71, temperature 98.5 F (36.9 C), temperature source Oral, resp. rate 18, height 5' (1.524 m), weight 74 kg, last menstrual period 01/23/2022. General: NAD Abdomen:non tender Cervical Exam:  Dilation: 4 Effacement (%): 70 Station: -2 Presentation: Vertex Exam by:: Dixie Coppa CNM  FHT: baseline 135, moderate variability, pos accel, occasional variable Toco:q 1-4 Pitocin at 6 milli-units  Results for orders placed or performed during the hospital encounter of 11/01/22 (from the past 24 hour(s))  CBC     Status: Abnormal   Collection Time: 11/01/22  2:36 PM  Result Value Ref Range   WBC 10.6 (H) 4.0 - 10.5 K/uL   RBC 4.73 3.87 - 5.11 MIL/uL   Hemoglobin 11.2 (L) 12.0 - 15.0 g/dL   HCT 97.5 (L) 30.0 - 51.1 %   MCV 74.8 (L) 80.0 - 100.0 fL   MCH 23.7 (L) 26.0 - 34.0 pg   MCHC 31.6 30.0 - 36.0 g/dL   RDW 02.1 (H) 11.7 - 35.6 %   Platelets 304 150 - 400 K/uL   nRBC 0.0 0.0 - 0.2 %  Type and screen [facility]     Status: None (Preliminary result)   Collection Time: 11/01/22  2:36 PM  Result Value Ref Range   ABO/RH(D) O POS    Antibody Screen POS    Sample Expiration 11/04/2022,2359    Antibody Identification ANTI JKA (Kidd a)    Antibody Titer (2) PENDING    Unit Number [id number]    Blood Component Type RED CELLS,LR    Unit division 00    Status of Unit ALLOCATED    Transfusion Status OK TO TRANSFUSE    Crossmatch Result COMPATIBLE    Unit Number [id number]    Blood Component Type RED CELLS,LR    Unit division 00    Status of Unit ALLOCATED    Transfusion Status OK TO TRANSFUSE    Crossmatch Result COMPATIBLE     Assessment:   27 y.o. [id number] [redacted]w[redacted]d admitted  for IOL secondary to oligohydramnios    Plan:   1) Labor -Continue to titrate Pitocin    2) Fetus - category II tracing   3) GBS negative, Membranes  intact   4) Pain Management: aware of all options, will ask if desired.   Carie Caddy, CNM  [facility]  11/01/2022 10:29 PM

## 2022-11-01 NOTE — Progress Notes (Addendum)
Routine Prenatal Care Visit  Subjective  Gina Lester Shon Hale is a 27 y.o. [phone] at [redacted]w[redacted]d being seen today for ongoing prenatal care.  She is currently monitored for the following issues for this low-risk pregnancy and has Susceptible to varicella (non-immune), currently pregnant; Supervision of other normal pregnancy, antepartum; STD (sexually transmitted disease) complicating pregnancy, antepartum; LGSIL on Pap smear of cervix; Marijuana use during pregnancy; and Oligohydramnios on their problem list.  ----------------------------------------------------------------------------------- Patient reports backache, no contractions, no cramping, and no leaking.   Contractions: Irritability. Vag. Bleeding: None.  Movement: Present. Leaking Fluid denies.  ----------------------------------------------------------------------------------- The following portions of the patient's history were reviewed and updated as appropriate: allergies, current medications, past family history, past medical history, past social history, past surgical history and problem list. Problem list updated.  Objective  Last menstrual period 01/23/2022. Pregravid weight 143 lb (64.9 kg) Total Weight Gain 20 lb 2.3 oz (9.136 kg) Urinalysis: Urine Protein Negative  Urine Glucose Negative  Fetal Status: Fetal Heart Rate (bpm): 136 Fundal Height: 37 cm Movement: Present  Presentation: Vertex  General:  Alert, oriented and cooperative. Patient is in no acute distress.  Skin: Skin is warm and dry. No rash noted.   Cardiovascular: Normal heart rate noted  Respiratory: Normal respiratory effort, no problems with respiration noted  Abdomen: Soft, gravid, appropriate for gestational age. Pain/Pressure: Present     Pelvic:   Very posterior, 1 cm/thick/ballotable, softening, gentle sweep  Dilation: 1 Effacement (%): Thick Station: Ballotable  Extremities: Normal range of motion.  Edema: None  Mental Status: Normal mood and affect.  Normal behavior. Normal judgment and thought content.   Assessment   27 y.o. [id number] at [redacted]w[redacted]d by  10/30/2022, by Last Menstrual Period presenting for routine prenatal visit  Plan   fourth Problems (from 03/22/22 to present)     Problem Noted Resolved   Supervision of other normal pregnancy, antepartum 03/22/2022 by Loran Senters, CMA No   Overview Addendum 10/08/2022  9:09 AM by Tresea Mall, CNM     Clinical Staff Provider  Office Location  [facility] Dating  10/30/2022, by Last Menstrual Period  Language  English Anatomy US  Normal female  Flu Vaccine  unsure Genetic Screen  NIPS: Negative/Female: 08/02/2022  TDaP vaccine   08/02/2022 Hgb A1C or  GTT Early : Third trimester : 94 08/02/2022  Covid unsure   LAB RESULTS   Rhogam  O/Positive/-- (09/12 0000)  Blood Type O/Positive/-- (09/12 0000)   Feeding Plan breast Antibody Negative (09/12 0000)  Contraception unsure Rubella 2.04 (09/12 0000)  Circumcision no RPR Non Reactive (01/11 1019)   Pediatrician  Kids care HBsAg Negative (09/12 0000)   Support Person devin HIV Non Reactive (01/11 1019)  Prenatal Classes no Varicella     GBS Negative/-- (03/14 0845)(For PCN allergy, check sensitivities)   BTL Consent  Hep C Non Reactive (09/12 0000)   VBAC Consent  Pap Diagnosis  Date Value Ref Range Status  05/08/2022 - Low grade squamous intraepithelial lesion (LSIL) (A)  Final      Hgb Electro      CF      SMA                    Term labor symptoms and general obstetric precautions including but not limited to vaginal bleeding, contractions, leaking of fluid and fetal movement were reviewed in detail with the patient. Please refer to After Visit Summary for other counseling recommendations.  Return if symptoms worsen or  fail to improve. Her BPP today indicates oligohydramnios. BPP was 4/8. She is sent to [facility] for IOL this afternoon.  Mirna Mires, CNM  11/01/2022 5:42 PM

## 2022-11-01 NOTE — H&P (Signed)
OB History & Physical   History of Present Illness:  Chief Complaint:   HPI:  Gina Lester is a 27 y.o. [phone] female at [redacted]w[redacted]d dated by L and 6wk [place].  She presents to L&D for induction of labor secondary to oligohydramnios. She was seen today for her ROB and had a BPP, the AFI was noted to be 1.22cm. She was given a cervical sweep and instructed to go to L and D around 3pm. Gina Lester reports that she has been contracting since the cervical sweep.  Gina Lester has had early and regular prenatal care. She was treated for both gonorrhea and chlamydia in early pregnancy. She was negative with her most recent testing on March 14.   Pregnancy Issues: 1. Gonorrhea/chlamydia 04/08/2022 2. Oligohydramnios  3. LSIL, HPV pos   Maternal Medical History:   Past Medical History:  Diagnosis Date   Constipation    Seasonal allergies    STD (sexually transmitted disease) complicating pregnancy, antepartum 04/08/2022   Positive for gonorrhea and chlamydia.    Past Surgical History:  Procedure Laterality Date   CHOLECYSTECTOMY N/A 05/17/2018   Procedure: LAPAROSCOPIC CHOLECYSTECTOMY;  Surgeon: Henrene Dodge, MD;  Location: [facility] ORS;  Service: General;  Laterality: N/A;   ENDOSCOPIC RETROGRADE CHOLANGIOPANCREATOGRAPHY (ERCP) WITH PROPOFOL N/A 05/16/2018   Procedure: ENDOSCOPIC RETROGRADE CHOLANGIOPANCREATOGRAPHY (ERCP) WITH PROPOFOL;  Surgeon: Midge Minium, MD;  Location: [facility] ENDOSCOPY;  Service: Endoscopy;  Laterality: N/A;    No Known Allergies  Prior to Admission medications   Medication Sig Start Date End Date Taking? Authorizing Provider  Prenatal Vit-Fe Fumarate-FA (MULTIVITAMIN-PRENATAL) 27-0.8 MG TABS tablet Take 1 tablet by mouth daily at 12 noon.    [provider]     Prenatal care site: [facility]   Social History: She  reports that she has never smoked. She has never used smokeless tobacco. She reports that she does not currently use alcohol. She reports that she  does not use drugs.  Family History: family history includes Gallbladder disease in her mother.   Review of Systems: A full review of systems was performed and negative except as noted in the HPI.     Physical Exam:  Vital Signs: LMP 01/23/2022 (Exact Date)  General: no acute distress.  HEENT: normocephalic, atraumatic Heart: regular rate & rhythm.  No murmurs/rubs/gallops Lungs: clear to auscultation bilaterally, normal respiratory effort Abdomen: soft, gravid, non-tender;  EFW: 6.5-7lbs  Pelvic:   External: Normal external female genitalia  Cervix: Dilation: 3 / Effacement (%): 70 / Station: -2    Extremities: non-tender, symmetric, NO edema bilaterally.   Neurologic: Alert & oriented x 3.    Results for orders placed or performed in visit on 11/01/22 (from the past 24 hour(s))  POC Urinalysis Dipstick OB     Status: None   Collection Time: 11/01/22  8:39 AM  Result Value Ref Range   Color, UA yellow    Clarity, UA clear    Glucose, UA Negative Negative   Bilirubin, UA negative    Ketones, UA negative    Spec Grav, UA 1.015 1.010 - 1.025   Blood, UA negative    pH, UA 6.5 5.0 - 8.0   POC,PROTEIN,UA Negative Negative, Trace, Small (1+), Moderate (2+), Large (3+), 4+   Urobilinogen, UA 0.2 0.2 or 1.0 E.U./dL   Nitrite, UA negative    Leukocytes, UA Negative Negative   Appearance     Odor      Pertinent Results:  Prenatal Labs: Blood type/Rh O+  Antibody screen neg  Rubella Immune  Varicella Non Immune  RPR NR  HBsAg Neg  HIV NR  GC Neg  3/14//24  Chlamydia Neg 10/04/22  Genetic screening negative  1 hour GTT 94  3 hour GTT   GBS negative  PAP LSIL HPV pos, 04/2022    SFK:CLEXNTZG 135, moderate variability, pos accel, neg decel  TOCO:q 2-3  SVE:  Dilation: 3 / Effacement (%): 70 / Station: -2    Cephalic by leopolds  No results found.  Assessment:  Gina Lester is a 27 y.o. [phone] female at [redacted]w[redacted]d with oligohydramnios.   Plan:  Admit to  Labor & Delivery Laboring from sweep, consider augmentation in no cervical change in 4 hours.  CBC, T&S, Reg diet IVF GBS  negative, membranes intact  Consents obtained. Continuous efm/toco Pain management: aware of all options, will ask if desired   Dr Logan Bores aware of admission and plan   ----- Carie Caddy, CNM  [facility] [place]

## 2022-11-02 ENCOUNTER — Encounter: Payer: Self-pay | Admitting: Obstetrics and Gynecology

## 2022-11-02 DIAGNOSIS — O9903 Anemia complicating the puerperium: Secondary | ICD-10-CM | POA: Diagnosis not present

## 2022-11-02 DIAGNOSIS — O4103X0 Oligohydramnios, third trimester, not applicable or unspecified: Secondary | ICD-10-CM | POA: Diagnosis not present

## 2022-11-02 DIAGNOSIS — D62 Acute posthemorrhagic anemia: Secondary | ICD-10-CM | POA: Diagnosis not present

## 2022-11-02 DIAGNOSIS — O480 Post-term pregnancy: Secondary | ICD-10-CM | POA: Diagnosis not present

## 2022-11-02 LAB — CBC
HCT: 33.6 % — ABNORMAL LOW (ref 36.0–46.0)
Hemoglobin: 10.6 g/dL — ABNORMAL LOW (ref 12.0–15.0)
MCH: 23.7 pg — ABNORMAL LOW (ref 26.0–34.0)
MCHC: 31.5 g/dL (ref 30.0–36.0)
MCV: 75.2 fL — ABNORMAL LOW (ref 80.0–100.0)
Platelets: 301 10*3/uL (ref 150–400)
RBC: 4.47 MIL/uL (ref 3.87–5.11)
RDW: 15.9 % — ABNORMAL HIGH (ref 11.5–15.5)
WBC: 16.4 10*3/uL — ABNORMAL HIGH (ref 4.0–10.5)
nRBC: 0 % (ref 0.0–0.2)

## 2022-11-02 LAB — TYPE AND SCREEN: Antibody Screen: POSITIVE

## 2022-11-02 LAB — URINE DRUG PANEL 7
Amphetamines, Urine: NEGATIVE ng/mL
Barbiturate Quant, Ur: NEGATIVE ng/mL
Benzodiazepine Quant, Ur: NEGATIVE ng/mL
Cannabinoid Quant, Ur: NEGATIVE ng/mL
Cocaine (Metab.): NEGATIVE ng/mL
Opiate Quant, Ur: NEGATIVE ng/mL
PCP Quant, Ur: NEGATIVE ng/mL

## 2022-11-02 LAB — RPR: RPR Ser Ql: NONREACTIVE

## 2022-11-02 LAB — HIV ANTIBODY (ROUTINE TESTING W REFLEX): HIV Screen 4th Generation wRfx: NONREACTIVE

## 2022-11-02 LAB — BPAM RBC: Blood Product Expiration Date: 202405162359

## 2022-11-02 MED ORDER — BENZOCAINE-MENTHOL 20-0.5 % EX AERO: 1.0000 | INHALATION_SPRAY | CUTANEOUS | Status: DC | PRN

## 2022-11-02 MED ORDER — ZOLPIDEM TARTRATE 5 MG PO TABS: 5.0000 mg | ORAL_TABLET | Freq: Every evening | ORAL | Status: DC | PRN

## 2022-11-02 MED ORDER — IBUPROFEN 600 MG PO TABS: 600.0000 mg | ORAL_TABLET | Freq: Four times a day (QID) | ORAL | Status: DC

## 2022-11-02 MED ORDER — ACETAMINOPHEN 500 MG PO TABS
1000.0000 mg | ORAL_TABLET | Freq: Four times a day (QID) | ORAL | Status: DC
  Filled 2022-11-02: qty 2

## 2022-11-02 MED ORDER — DOCUSATE SODIUM 100 MG PO CAPS
100.0000 mg | ORAL_CAPSULE | Freq: Two times a day (BID) | ORAL | Status: DC
  Filled 2022-11-02: qty 1

## 2022-11-02 MED ORDER — ONDANSETRON HCL 4 MG PO TABS: 4.0000 mg | ORAL_TABLET | ORAL | Status: DC | PRN

## 2022-11-02 MED ORDER — WITCH HAZEL-GLYCERIN EX PADS: 1.0000 | MEDICATED_PAD | CUTANEOUS | Status: DC | PRN

## 2022-11-02 MED ORDER — COCONUT OIL OIL: 1.0000 | TOPICAL_OIL | Status: DC | PRN

## 2022-11-02 MED ORDER — DIPHENHYDRAMINE HCL 25 MG PO CAPS: 25.0000 mg | ORAL_CAPSULE | Freq: Four times a day (QID) | ORAL | Status: DC | PRN

## 2022-11-02 MED ORDER — DIBUCAINE (PERIANAL) 1 % EX OINT: 1.0000 | TOPICAL_OINTMENT | CUTANEOUS | Status: DC | PRN

## 2022-11-02 MED ORDER — PRENATAL MULTIVITAMIN CH
1.0000 | ORAL_TABLET | Freq: Every day | ORAL | Status: DC
  Administered 2022-11-02: 1 via ORAL
  Filled 2022-11-02: qty 1

## 2022-11-02 MED ORDER — ONDANSETRON HCL 4 MG/2ML IJ SOLN: 4.0000 mg | INTRAMUSCULAR | Status: DC | PRN

## 2022-11-02 MED ORDER — SIMETHICONE 80 MG PO CHEW: 80.0000 mg | CHEWABLE_TABLET | ORAL | Status: DC | PRN

## 2022-11-02 NOTE — Discharge Summary (Signed)
Obstetrical Discharge Summary  Date of Admission: 11/01/2022 Date of Discharge: 11/03/2022  Primary OB: [facility]   Gestational Age at Delivery: [redacted]w[redacted]d   Antepartum complications: oligohydramnios Reason for Admission: Oligohydramnios Date of Delivery: 11/02/2022  Delivered By: Siri Cole, CNM  Delivery Type: spontaneous vaginal delivery Intrapartum complications/course: None Anesthesia: none Placenta: Delivered and expressed via active management. Intact: yes.Multple calcifications. To pathology: no.  Laceration: n/a Episiotomy: none EBL: Baby: Liveborn female, APGARs 8/9, weight 3120 g.    Discharge Diagnosis: Term pregnancy, delivered.  Postpartum course: Uncomplicated Discharge Vital Signs:  Current Vital Signs 24h Vital Sign Ranges  T 98.2 F (36.8 C) Temp  Avg: 98.4 F (36.9 C)  Min: 98 F (36.7 C)  Max: 99.2 F (37.3 C)  BP 100/60 BP  Min: 98/69  Max: 107/68  HR 72 Pulse  Avg: 75.8  Min: 68  Max: 88  RR 18 Resp  Avg: 18  Min: 18  Max: 18  SaO2 99 % Room Air SpO2  Avg: 97.5 %  Min: 96 %  Max: 99 %       24 Hour I/O Current Shift I/O  Time Ins Outs 04/12 0701 - 04/13 0700 In: 240 [P.O.:240] Out: -  No intake/output data recorded.    Patient Vitals for the past 6 hrs:  BP Temp Temp src Pulse Resp SpO2  11/03/22 0809 100/60 98.2 F (36.8 C) Oral 72 18 99 %    Discharge Exam:  General: alert, cooperative, and appears stated age Heart: RRR Lungs: CTAB Abdomen: soft, non-tender, normal BS Lochia: appropriate Uterine Fundus: firm Perineum: healing well DVT Evaluation: No evidence of DVT seen on physical exam.  Recent Labs  Lab 11/01/22 1436 11/02/22 0615  WBC 10.6* 16.4*  HGB 11.2* 10.6*  HCT 35.4* 33.6*  PLT 304 301    Disposition: Home  Rh Immune globulin given: no Rubella vaccine given: no Tdap vaccine given in AP or PP setting: yes Flu vaccine given in AP or PP setting: yes  Contraception:  undecided   Prenatal/Postnatal Panel: O  POS//Rubella Immune//Varicella Not immune//RPR negative//HIV negative/HepB Surface Ag negative//pap  LSIL, HPV pos   (date: 05/08/2022)//plans to breastfeed, plans to bottle feed  Plan:  Gina Lester was discharged to home in good condition. Follow-up appointment with LMD in 2 weeks for a virtual visit and then in 6wks for in person PP visit   No future appointments.   Discharge Medications: Allergies as of 11/03/2022   No Known Allergies      Medication List     TAKE these medications    multivitamin-prenatal 27-0.8 MG Tabs tablet Take 1 tablet by mouth daily at 12 noon.      Glenetta Borg, CNM 11/03/22 9:01 AM

## 2022-11-02 NOTE — Progress Notes (Signed)
[facility] Subjective:  Doing well postpartum 9 hours/day 0; she is tolerating regular diet, her pain is controlled with PO medication, she is ambulating and voiding without difficulty. She reports breastfeeding is going well.  Objective:  Vital signs in last 24 hours: Temp:  [97.4 F (36.3 C)-98.5 F (36.9 C)] 98.4 F (36.9 C) (04/12 0829) Pulse Rate:  [65-79] 66 (04/12 0829) Resp:  [16-20] 20 (04/12 0829) BP: (98-126)/(55-77) 98/67 (04/12 0829) SpO2:  [97 %-99 %] 99 % (04/12 0829) Weight:  [74 kg] 74 kg (04/11 1923)    General: NAD Pulmonary: no increased work of breathing Abdomen: non-distended, non-tender, fundus firm at level of umbilicus Extremities: no edema, no erythema, no tenderness  Results for orders placed or performed during the hospital encounter of 11/01/22 (from the past 72 hour(s))  CBC     Status: Abnormal   Collection Time: 11/01/22  2:36 PM  Result Value Ref Range   WBC 10.6 (H) 4.0 - 10.5 K/uL   RBC 4.73 3.87 - 5.11 MIL/uL   Hemoglobin 11.2 (L) 12.0 - 15.0 g/dL   HCT 23.3 (L) 00.7 - 62.2 %   MCV 74.8 (L) 80.0 - 100.0 fL   MCH 23.7 (L) 26.0 - 34.0 pg   MCHC 31.6 30.0 - 36.0 g/dL   RDW 63.3 (H) 35.4 - 56.2 %   Platelets 304 150 - 400 K/uL   nRBC 0.0 0.0 - 0.2 %    Comment: Performed at [facility], [address] [place], [place] [address]  Type and screen [facility]     Status: None (Preliminary result)   Collection Time: 11/01/22  2:36 PM  Result Value Ref Range   ABO/RH(D) O POS    Antibody Screen POS    Sample Expiration 11/04/2022,2359    Antibody Identification ANTI JKA (Kidd a)    Antibody Titer (2) PENDING    Unit Number [id number]    Blood Component Type RED CELLS,LR    Unit division 00    Status of Unit ALLOCATED    Transfusion Status OK TO TRANSFUSE    Crossmatch Result COMPATIBLE    Unit Number [id number]    Blood Component Type RED CELLS,LR    Unit division 00    Status of Unit  ALLOCATED    Transfusion Status OK TO TRANSFUSE    Crossmatch Result COMPATIBLE   HIV Antibody (routine testing w rflx)     Status: None   Collection Time: 11/01/22  4:17 PM  Result Value Ref Range   HIV Screen 4th Generation wRfx Non Reactive Non Reactive    Comment: Performed at [facility], [address] [place], [place] [address]  CBC     Status: Abnormal   Collection Time: 11/02/22  6:15 AM  Result Value Ref Range   WBC 16.4 (H) 4.0 - 10.5 K/uL   RBC 4.47 3.87 - 5.11 MIL/uL   Hemoglobin 10.6 (L) 12.0 - 15.0 g/dL   HCT 46.8 (L) 03.2 - 12.2 %   MCV 75.2 (L) 80.0 - 100.0 fL   MCH 23.7 (L) 26.0 - 34.0 pg   MCHC 31.5 30.0 - 36.0 g/dL   RDW 48.2 (H) 50.0 - 37.0 %   Platelets 301 150 - 400 K/uL   nRBC 0.0 0.0 - 0.2 %    Comment: Performed at [facility], [address] [place], [place] [address]    Assessment:   27 y.o. [id number] postpartum day # 0  Plan:    1) Acute blood loss anemia - hemodynamically stable and asymptomatic - po ferrous sulfate  2) Blood Type --/--/O POS (04/11 1436) / Rubella 2.04 (09/12 0000) / Varicella Non-Immune  3) TDAP status  given antepartum  4) Feeding plan: breast and formula   5)  Education given regarding options for contraception, as well as compatibility with breast feeding if applicable.  Patient plans on  undecided  for contraception.  6) Disposition: continue current care   Parke Poisson, CNM [facility] 11/02/2022, 9:21 AM

## 2022-11-02 NOTE — Lactation Note (Signed)
This note was copied from a baby's chart. Lactation Consultation Note  Patient Name: Boy Ramyah Witcher TGGYI'R Date: 11/02/2022 Age:25 hours Reason for consult: Initial assessment;Term   Maternal Data This is mom's 3rd baby. She is an experienced breastfeeding mother. On initial visit mom reports baby is latching and breastfeeding well. Mom with no lactation concerns at this time. Mom report she has never needed use of a breastpump and does not have one at home. Offered mom a manual harmony pump. Mom will let LC know tomorrow if she wants to have one to take home.  Has patient been taught Hand Expression?: Yes Does the patient have breastfeeding experience prior to this delivery?: Yes How long did the patient breastfeed?: 6 months  Feeding Mother's Current Feeding Choice: Breast Milk and Formula   Interventions Interventions: Breast feeding basics reviewed;Education  Discharge Pump:  (Mom does not have a pump for home use. Per mom she has never needed one. Offered manual pump. Mom will consider if she would want one and let oncoming LC know if she does.)  Consult Status Consult Status: Follow-up Date: 11/03/22 Follow-up type: In-patient  Update provided to care nurse.  Fuller Song 11/02/2022, 5:17 PM

## 2022-11-02 NOTE — Discharge Instructions (Signed)
Discharge instructions:   Call office if you have any of the following: headache, visual changes, fever >101 F, chills, breast concerns, excessive vaginal bleeding, leg pain or redness, depression or any other concerns.   Activity: Do not lift > 20 lbs for 6 weeks.  No intercourse or tampons for 6 weeks.  No driving for 1-2 weeks.   Call your doctor for increased pain or vaginal bleeding, temperature above 101.0, depression, or concerns.  No strenuous activity or heavy lifting for 6 weeks.  No intercourse, tampons, douching, or enemas for 6 weeks.  Tub baths and showers are ok.  No driving for 2 weeks or while taking pain medications.  Continue prenatal vitamin and iron.  Increase calories and fluids while breastfeeding.  For concerns about your baby please call the pediatrician For breastfeeding issues please remember to call our lactation consultants. 

## 2022-11-02 NOTE — Plan of Care (Signed)
Care plan complete

## 2022-11-03 MED ORDER — VARICELLA VIRUS VACCINE LIVE 1350 PFU/0.5ML IJ SUSR
0.5000 mL | Freq: Once | INTRAMUSCULAR | Status: AC
  Administered 2022-11-03: 0.5 mL via SUBCUTANEOUS
  Filled 2022-11-03: qty 0.5

## 2022-11-03 MED ORDER — MEASLES, MUMPS & RUBELLA VAC IJ SOLR
0.5000 mL | Freq: Once | INTRAMUSCULAR | Status: DC
  Filled 2022-11-03: qty 0.5

## 2022-11-03 NOTE — Lactation Note (Signed)
This note was copied from a baby's chart. Lactation Consultation Note  Patient Name: Gina Lester DUKGU'R Date: 11/03/2022 Age:25 hours Reason for consult: Follow-up assessment;Maternal discharge   Maternal Data Does the patient have breastfeeding experience prior to this delivery?: Yes How long did the patient breastfeed?: 6 mths  Feeding Mother's Current Feeding Choice: Breast Milk and Formula Mom had recently fed baby so I did not observe a breastfeeding, mom states baby latches easily and breastfeeds well  LATCH Score Latch:  (I did not observe a feeding)  Audible Swallowing: A few with stimulation  Type of Nipple: Everted at rest and after stimulation  Comfort (Breast/Nipple): Soft / non-tender  Hold (Positioning): Assistance needed to correctly position infant at breast and maintain latch.  LATCH Score: 8   Lactation Tools Discussed/Used  LC name updated on white board  Interventions    Discharge WIC Program: Yes  Consult Status Consult Status: PRN Date: 11/03/22 Follow-up type: In-patient    Dyann Kief 11/03/2022, 11:05 AM

## 2022-11-03 NOTE — Final Progress Note (Signed)
Post Partum Day 1 Subjective: Gina Lester is feeling well overall. She is ambulating, voiding, and tolerating POs without difficulty. Her pain is well-controlled and her bleeding is WNL. Her mood is stable. Breastfeeding and bottle feeding are going well.   Objective: Blood pressure 100/60, pulse 72, temperature 98.2 F (36.8 C), temperature source Oral, resp. rate 18, height 5' (1.524 m), weight 74 kg, last menstrual period 01/23/2022, SpO2 99 %, unknown if currently breastfeeding.  Physical Exam:  General: alert, cooperative, and appears stated age Heart: RRR Lungs: CTAB Abdomen: soft, non-tender, normal BS Lochia: appropriate Uterine Fundus: firm Perineum: healing well DVT Evaluation: No evidence of DVT seen on physical exam.  Recent Labs    11/01/22 1436 11/02/22 0615  HGB 11.2* 10.6*  HCT 35.4* 33.6*    Assessment/Plan: Discharge home Discharge instructions reviewed F/u: video visit in 2 weeks, office visit in 6 weeks Undecide about contraception   LOS: 2 days   Glenetta Borg, CNM 11/03/2022, 9:13 AM

## 2022-11-03 NOTE — Progress Notes (Signed)
Verb understanding of d/c instructions   Home w/partner to self care

## 2022-11-04 LAB — TYPE AND SCREEN
ABO/RH(D): O POS
Unit division: 0
Unit division: 0

## 2022-11-04 LAB — BPAM RBC
Blood Product Expiration Date: 202405162359
Unit Type and Rh: 5100

## 2022-11-05 ENCOUNTER — Other Ambulatory Visit: Payer: Medicaid Other

## 2023-03-24 ENCOUNTER — Encounter: Payer: Self-pay | Admitting: Certified Nurse Midwife

## 2023-04-16 ENCOUNTER — Ambulatory Visit: Payer: Medicaid Other

## 2023-04-16 ENCOUNTER — Other Ambulatory Visit: Payer: Self-pay

## 2023-04-16 ENCOUNTER — Other Ambulatory Visit (HOSPITAL_COMMUNITY)
Admission: RE | Admit: 2023-04-16 | Discharge: 2023-04-16 | Disposition: A | Discharge status: Home / Self Care | Payer: Medicaid Other | Source: Ambulatory Visit | Attending: Licensed Practical Nurse | Admitting: Licensed Practical Nurse

## 2023-04-16 VITALS — BP 103/73 | HR 61 | Ht 60.0 in | Wt 139.0 lb

## 2023-04-16 DIAGNOSIS — Z113 Encounter for screening for infections with a predominantly sexual mode of transmission: Secondary | ICD-10-CM | POA: Insufficient documentation

## 2023-04-16 DIAGNOSIS — Z3200 Encounter for pregnancy test, result unknown: Secondary | ICD-10-CM

## 2023-04-16 NOTE — Progress Notes (Signed)
NURSE VISIT NOTE  Subjective:    Patient ID: Shlanda Chojnacki, female    DOB: [birth year], 27 y.o.   MRN: [id number]  HPI  Patient is a 27 y.o. [phone] female who presents for pregnancy test as she has been nauseas missed July period then randomly in August then again towards the end of August she started to heavily bleed is concerned it was pregnancy, will order beta in office today as pt states she is unsure if it was a miscarriage she states she has never had irregular period had a pregnancy test at home and was negative. denies dysuria, hematuria, urinary frequency, urinary urgency, flank pain, abdominal pain, pelvic pain, cloudy malordorous urine, genital rash, genital irritation, and vaginal discharge. Patient admits to history of known exposure to STD partner stated gonorrhea  but she is unsure if that's what it was. Patient verified her label for ancillary swab she self collected in office.     Objective:    BP 103/73   Pulse 61   Ht 5' (1.524 m)   Wt 139 lb (63 kg)   LMP 03/22/2023   Breastfeeding No   BMI 27.15 kg/m    Assessment:   1. Screening examination for STD (sexually transmitted disease)   2. Possible pregnancy, not confirmed       Plan:   GC and chlamydia DNA  probe sent to lab. Treatment: abstain from coitus during course of treatment ROV prn if symptoms persist or worsen.   Loney Laurence, CMA

## 2023-04-17 LAB — CERVICOVAGINAL ANCILLARY ONLY
Bacterial Vaginitis (gardnerella): POSITIVE — AB
Candida Glabrata: NEGATIVE
Candida Vaginitis: NEGATIVE
Chlamydia: POSITIVE — AB
Comment: NEGATIVE
Comment: NEGATIVE
Comment: NEGATIVE
Comment: NEGATIVE
Comment: NEGATIVE
Comment: NORMAL
Neisseria Gonorrhea: NEGATIVE
Trichomonas: NEGATIVE

## 2023-04-17 LAB — BETA HCG QUANT (REF LAB): hCG Quant: <1 m[IU]/mL

## 2023-04-18 ENCOUNTER — Other Ambulatory Visit: Payer: Medicaid Other

## 2023-04-18 ENCOUNTER — Other Ambulatory Visit: Payer: Self-pay

## 2023-04-18 DIAGNOSIS — N760 Acute vaginitis: Secondary | ICD-10-CM

## 2023-04-18 DIAGNOSIS — A749 Chlamydial infection, unspecified: Secondary | ICD-10-CM

## 2023-04-18 DIAGNOSIS — Z3200 Encounter for pregnancy test, result unknown: Secondary | ICD-10-CM

## 2023-04-18 MED ORDER — AZITHROMYCIN 1 G PO PACK
1.0000 g | PACK | Freq: Once | ORAL | 0 refills | Status: AC
Start: 2023-04-18 — End: 2023-04-18

## 2023-04-18 MED ORDER — METRONIDAZOLE 500 MG PO TABS
500.0000 mg | ORAL_TABLET | Freq: Two times a day (BID) | ORAL | 0 refills | Status: DC
Start: 2023-04-18 — End: 2024-03-10

## 2023-04-19 LAB — BETA HCG QUANT (REF LAB): hCG Quant: <1 m[IU]/mL

## 2023-12-08 ENCOUNTER — Encounter: Payer: Self-pay | Admitting: Licensed Practical Nurse

## 2023-12-12 NOTE — Patient Instructions (Signed)
 Preventing Sexually Transmitted Infections, Adult Sexually transmitted infections (STIs) are spread from person to person (are contagious). They are spread, or transmitted, during sex. The sex may be vaginal, anal, or oral. STIs can be passed during sexual contact with skin, genitals, mouth, or rectum. They may spread through body fluids, such as saliva, semen, blood, vaginal mucus, and urine. STIs are very common. They can happen in people of all ages. Some common STIs are: Herpes. Hepatitis B. Chlamydia. Gonorrhea. Syphilis. Trichomoniasis. Human papillomavirus (HPV). Human immunodeficiency virus (HIV). This can cause acquired immunodeficiency syndrome (AIDS). How can STIs affect me? You may not have symptoms with an STI. Even if you do not have symptoms, you can still spread the infection to others. You also still need treatment. STIs can be treated. Some STIs can be cured. Other STIs cannot be cured and will affect you for the rest of your life. Certain STIs may: Require you to take medicine for the rest of your life. Affect your ability to have children. Increase your risk for getting other STIs. Increase your risk of getting certain conditions. These may include: Cervical cancer. Pelvic inflammatory disease (PID). Organ damage or damage to other parts of your body. This can happen if the infection spreads. Cause problems during pregnancy. STIs may be spread to the baby during pregnancy or birth. Females tend to have more severe problems from STIs than males. What can increase my risk? You may be more at risk for an STI if: You do not use protection during sex. You have more than one sex partner. You have a sex partner who has other sex partners. You have sex with a person who has an STI. You have an STI, or you have had an STI before. You inject drugs or have a sex partner who injects drugs. What actions can I take to prevent STIs? The only way to fully prevent STIs is not to  have sex of any kind. This is called practicing abstinence. If you are sexually active, you can protect yourself and others by taking these actions to lower your risk of getting an STI: Lifestyle Have only one sex partner or limit the number of sex partners you have. Avoid having sex after you have alcohol or drugs. Alcohol and drugs can affect your ability to make good choices. This can lead to risky sexual behaviors. Go to prevention counseling. This can teach you how to avoid getting an STI. Barrier protection  Use methods to stop body fluids from being exchanged between partners during sex (barrier protection). These methods can be used during oral, vaginal, or anal sex. They include: External condom, for males. Internal condom, for females. Dental dam. Use a new barrier method for every sex act from start to finish. Know that a barrier method may not protect you from all STIs. Some STIs, such as herpes, are spread through skin-to-skin contact. Avoid all sexual contact if you or a partner has herpes and there is an active flare with open sores. Birth control pills, injections, implants, and intrauterine devices (IUDs) do not protect against STIs. To prevent both STIs and pregnancy, always use a condom with a second form of birth control. General information Ask your health care provider about taking pre-exposure prophylaxis (PrEP) to prevent HIV. Stay up to date on your vaccines. Some vaccines can lower your risk of getting certain STIs. These include: Hepatitis B vaccine. HPV vaccine. This is recommended for people up to age 66. Get tested for STIs. Have your  partners get tested, too. If you test positive for an STI, follow recommendations from your health care provider about treatment. Make sure your sex partners are tested and treated as well. Where to find more information Learn more about STIs from: Centers for Disease Control and Prevention (CDC): More information about certain  STIs: TonerPromos.no Places to get sexual health counseling and treatment for free or at a low cost: gettested.TonerPromos.no U.S. Department of Health and Human Services [facility]): TravelLesson.ca This information is not intended to replace advice given to you by your health care provider. Make sure you discuss any questions you have with your health care provider. Document Revised: 07/19/2022 Document Reviewed: 12/22/2021 Elsevier Patient Education  2024 [employer].

## 2023-12-12 NOTE — Progress Notes (Signed)
    GYNECOLOGY PROGRESS NOTE  Subjective:    Patient ID: Gina Lester, female    DOB: 1995-09-03, 28 y.o.   MRN: [id number]  HPI  Patient is a 28 y.o. [phone] female who presents for STD screening. She is here with complaints of nausea every morning with vomiting, dizziness, back pain, cramping, shortness of breath and headaches. These symptoms started on April 25 and are daily.   She had sex using a condom twice since her last cycle and condoms are her form of contraception. She is ten days late from her period but so far has had negative UPT.   The following portions of the patient's history were reviewed and updated as appropriate: allergies, current medications, past family history, past medical history, past social history, past surgical history, and problem list.  Review of Systems Pertinent items are noted in HPI.   Objective:   not currently breastfeeding. There is no height or weight on file to calculate BMI. General appearance: alert and cooperative Abdomen: soft, non-tender; bowel sounds normal; no masses,  no organomegaly Pelvic: deferred   Assessment:   1. Screen for STD (sexually transmitted disease)      Plan:   Amenorrhea- Will get hcg blood test today to rule out pregnancy. Advised urgent care with worsening symptoms. Referral placed to establish care with family medicine practice. Patient declines zofran . Imitrex sent to see if it helps with headache that is not relived with tylenol  and ibuprofen .    Donato Fu, CNM [facility]

## 2023-12-13 ENCOUNTER — Encounter: Payer: Self-pay | Admitting: Certified Nurse Midwife

## 2023-12-13 ENCOUNTER — Ambulatory Visit (INDEPENDENT_AMBULATORY_CARE_PROVIDER_SITE_OTHER): Admitting: Certified Nurse Midwife

## 2023-12-13 ENCOUNTER — Other Ambulatory Visit (HOSPITAL_COMMUNITY)
Admission: RE | Admit: 2023-12-13 | Discharge: 2023-12-13 | Disposition: A | Discharge status: Home / Self Care | Source: Ambulatory Visit | Attending: Certified Nurse Midwife | Admitting: Certified Nurse Midwife

## 2023-12-13 VITALS — BP 114/77 | HR 63 | Resp 16 | Ht 60.0 in | Wt 128.1 lb

## 2023-12-13 DIAGNOSIS — M549 Dorsalgia, unspecified: Secondary | ICD-10-CM

## 2023-12-13 DIAGNOSIS — R42 Dizziness and giddiness: Secondary | ICD-10-CM | POA: Diagnosis not present

## 2023-12-13 DIAGNOSIS — R1110 Vomiting, unspecified: Secondary | ICD-10-CM | POA: Diagnosis not present

## 2023-12-13 DIAGNOSIS — Z113 Encounter for screening for infections with a predominantly sexual mode of transmission: Secondary | ICD-10-CM | POA: Insufficient documentation

## 2023-12-13 DIAGNOSIS — N912 Amenorrhea, unspecified: Secondary | ICD-10-CM | POA: Diagnosis not present

## 2023-12-13 MED ORDER — SUMATRIPTAN SUCCINATE 25 MG PO TABS: 25.0000 mg | ORAL_TABLET | Freq: Two times a day (BID) | ORAL | 0 refills | Status: DC | PRN

## 2023-12-14 LAB — RPR: RPR Ser Ql: NONREACTIVE

## 2023-12-14 LAB — BETA HCG QUANT (REF LAB): hCG Quant: <1 m[IU]/mL

## 2023-12-14 LAB — HSV 1 AND 2 AB, IGG
HSV 1 Glycoprotein G Ab, IgG: REACTIVE — AB
HSV 2 IgG, Type Spec: NONREACTIVE

## 2023-12-14 LAB — HEPATITIS B SURFACE ANTIGEN: Hepatitis B Surface Ag: NEGATIVE

## 2023-12-14 LAB — HIV ANTIBODY (ROUTINE TESTING W REFLEX): HIV Screen 4th Generation wRfx: NONREACTIVE

## 2023-12-14 LAB — HEPATITIS C ANTIBODY: Hep C Virus Ab: NONREACTIVE

## 2023-12-18 LAB — CERVICOVAGINAL ANCILLARY ONLY
Bacterial Vaginitis (gardnerella): POSITIVE — AB
Candida Glabrata: NEGATIVE
Candida Vaginitis: NEGATIVE
Chlamydia: NEGATIVE
Comment: NEGATIVE
Comment: NEGATIVE
Comment: NEGATIVE
Comment: NEGATIVE
Comment: NEGATIVE
Comment: NORMAL
Neisseria Gonorrhea: NEGATIVE
Trichomonas: NEGATIVE

## 2023-12-19 ENCOUNTER — Other Ambulatory Visit: Payer: Self-pay

## 2023-12-19 DIAGNOSIS — B9689 Other specified bacterial agents as the cause of diseases classified elsewhere: Secondary | ICD-10-CM

## 2023-12-19 MED ORDER — METRONIDAZOLE 500 MG PO TABS
500.0000 mg | ORAL_TABLET | Freq: Two times a day (BID) | ORAL | 0 refills | Status: DC
Start: 2023-12-19 — End: 2024-03-10

## 2023-12-26 ENCOUNTER — Other Ambulatory Visit: Payer: Self-pay | Admitting: Certified Nurse Midwife

## 2023-12-26 DIAGNOSIS — R109 Unspecified abdominal pain: Secondary | ICD-10-CM

## 2023-12-26 DIAGNOSIS — Z0000 Encounter for general adult medical examination without abnormal findings: Secondary | ICD-10-CM

## 2024-01-30 ENCOUNTER — Emergency Department: Admission: EM | Admit: 2024-01-30 | Discharge: 2024-01-30 | Disposition: A | Discharge status: Home / Self Care

## 2024-01-30 ENCOUNTER — Other Ambulatory Visit: Payer: Self-pay

## 2024-01-30 ENCOUNTER — Encounter: Payer: Self-pay | Admitting: Emergency Medicine

## 2024-01-30 DIAGNOSIS — Z3491 Encounter for supervision of normal pregnancy, unspecified, first trimester: Secondary | ICD-10-CM

## 2024-01-30 DIAGNOSIS — Z3A00 Weeks of gestation of pregnancy not specified: Secondary | ICD-10-CM | POA: Diagnosis not present

## 2024-01-30 DIAGNOSIS — O210 Mild hyperemesis gravidarum: Secondary | ICD-10-CM | POA: Insufficient documentation

## 2024-01-30 DIAGNOSIS — O219 Vomiting of pregnancy, unspecified: Secondary | ICD-10-CM | POA: Diagnosis present

## 2024-01-30 LAB — COMPREHENSIVE METABOLIC PANEL WITH GFR
ALT: 16 U/L (ref 0–44)
AST: 20 U/L (ref 15–41)
Albumin: 4.7 g/dL (ref 3.5–5.0)
Alkaline Phosphatase: 59 U/L (ref 38–126)
Anion gap: 11 (ref 5–15)
BUN: 14 mg/dL (ref 6–20)
CO2: 25 mmol/L (ref 22–32)
Calcium: 9.6 mg/dL (ref 8.9–10.3)
Chloride: 101 mmol/L (ref 98–111)
Creatinine, Ser: <0.3 mg/dL — ABNORMAL LOW (ref 0.44–1.00)
Glucose, Bld: 146 mg/dL — ABNORMAL HIGH (ref 70–99)
Potassium: 3.1 mmol/L — ABNORMAL LOW (ref 3.5–5.1)
Sodium: 137 mmol/L (ref 135–145)
Total Bilirubin: 1 mg/dL (ref 0.0–1.2)
Total Protein: 9 g/dL — ABNORMAL HIGH (ref 6.5–8.1)

## 2024-01-30 LAB — URINALYSIS, ROUTINE W REFLEX MICROSCOPIC
Bacteria, UA: NONE SEEN
Bilirubin Urine: NEGATIVE
Glucose, UA: NEGATIVE mg/dL
Ketones, ur: 20 mg/dL — AB
Leukocytes,Ua: NEGATIVE
Nitrite: NEGATIVE
Protein, ur: NEGATIVE mg/dL
Specific Gravity, Urine: 1.01 (ref 1.005–1.030)
pH: 6 (ref 5.0–8.0)

## 2024-01-30 LAB — CBC
HCT: 43.4 % (ref 36.0–46.0)
Hemoglobin: 14.8 g/dL (ref 12.0–15.0)
MCH: 28.8 pg (ref 26.0–34.0)
MCHC: 34.1 g/dL (ref 30.0–36.0)
MCV: 84.6 fL (ref 80.0–100.0)
Platelets: 262 K/uL (ref 150–400)
RBC: 5.13 MIL/uL — ABNORMAL HIGH (ref 3.87–5.11)
RDW: 13.5 % (ref 11.5–15.5)
WBC: 9.4 K/uL (ref 4.0–10.5)
nRBC: 0 % (ref 0.0–0.2)

## 2024-01-30 LAB — POC URINE PREG, ED: Preg Test, Ur: POSITIVE — AB

## 2024-01-30 LAB — HCG, QUANTITATIVE, PREGNANCY: hCG, Beta Chain, Quant, S: 80026 m[IU]/mL — ABNORMAL HIGH (ref <5)

## 2024-01-30 LAB — LIPASE, BLOOD: Lipase: 26 U/L (ref 11–51)

## 2024-01-30 MED ORDER — METOCLOPRAMIDE HCL 5 MG/ML IJ SOLN
10.0000 mg | Freq: Once | INTRAMUSCULAR | Status: AC
  Administered 2024-01-30: 10 mg via INTRAVENOUS
  Filled 2024-01-30: qty 2

## 2024-01-30 MED ORDER — SODIUM CHLORIDE 0.9 % IV BOLUS
1000.0000 mL | Freq: Once | INTRAVENOUS | Status: AC
  Administered 2024-01-30: 1000 mL via INTRAVENOUS

## 2024-01-30 MED ORDER — DOXYLAMINE-PYRIDOXINE 10-10 MG PO TBEC: DELAYED_RELEASE_TABLET | ORAL | 0 refills | Status: DC

## 2024-01-30 MED ORDER — METOCLOPRAMIDE HCL 5 MG PO TABS: 5.0000 mg | ORAL_TABLET | Freq: Two times a day (BID) | ORAL | 0 refills | Status: DC

## 2024-01-30 NOTE — Discharge Instructions (Signed)
 Call make a follow-up appointment with your OB/GYN or health department.  Increase fluids as tolerated including ginger ale which may also settle your stomach.  2 prescriptions were sent to the pharmacy for nausea.  Take these only as directed until you are able to see your OB/GYN.  Return to the emergency department if any worsening of your symptoms with these medications.

## 2024-01-30 NOTE — ED Triage Notes (Signed)
 Pt ambulatory to triage with c/o n/v for last 2 days.  States she has been unable to keep anything down.  States she took a home pregnancy test and it was positive.

## 2024-01-30 NOTE — ED Provider Notes (Signed)
 [facility] Provider Note    Event Date/Time   First MD Initiated Contact with Patient 01/30/24 1156     (approximate)   History   Emesis   HPI  Gina Lester is a 28 y.o. female   presents to the ED with complaint of nausea and vomiting for the last 2 days.  Patient states that she took a home pregnancy test which was positive.  She has been unable to keep anything down.  She reports that in the past Zofran  has not helped with her vomiting.  She has taken Diclegis  over the counter in the past with improvement of her nausea but has not taken any today.  She denies any abdominal pain, cramping, vaginal bleeding or vaginal discharge.      Physical Exam   Triage Vital Signs: ED Triage Vitals [01/30/24 1117]  Encounter Vitals Group     BP 121/72     Girls Systolic BP Percentile      Girls Diastolic BP Percentile      Boys Systolic BP Percentile      Boys Diastolic BP Percentile      Pulse Rate 91     Resp 18     Temp 98.3 F (36.8 C)     Temp Source Oral     SpO2 100 %     Weight 135 lb (61.2 kg)     Height 5' (1.524 m)     Head Circumference      Peak Flow      Pain Score 5     Pain Loc      Pain Education      Exclude from Growth Chart     Most recent vital signs: Vitals:   01/30/24 1117 01/30/24 1435  BP: 121/72 120/70  Pulse: 91 88  Resp: 18 18  Temp: 98.3 F (36.8 C)   SpO2: 100% 100%     General: Awake, no distress.  Actively vomiting on exam. CV:  Good peripheral perfusion.  Heart regular rate and rhythm. Resp:  Normal effort.  Lungs clear bilaterally. Abd:  No distention.  Other:     ED Results / Procedures / Treatments   Labs (all labs ordered are listed, but only abnormal results are displayed) Labs Reviewed  COMPREHENSIVE METABOLIC PANEL WITH GFR - Abnormal; Notable for the following components:      Result Value   Potassium 3.1 (*)    Glucose, Bld 146 (*)    Creatinine, Ser <0.30 (*)    Total Protein  9.0 (*)    All other components within normal limits  CBC - Abnormal; Notable for the following components:   RBC 5.13 (*)    All other components within normal limits  URINALYSIS, ROUTINE W REFLEX MICROSCOPIC - Abnormal; Notable for the following components:   Color, Urine YELLOW (*)    APPearance CLEAR (*)    Hgb urine dipstick SMALL (*)    Ketones, ur 20 (*)    All other components within normal limits  HCG, QUANTITATIVE, PREGNANCY - Abnormal; Notable for the following components:   hCG, Beta Chain, Quant, S 80,026 (*)    All other components within normal limits  POC URINE PREG, ED - Abnormal; Notable for the following components:   Preg Test, Ur Positive (*)    All other components within normal limits  LIPASE, BLOOD      PROCEDURES:  Critical Care performed:   Procedures   MEDICATIONS ORDERED IN ED:  Medications  sodium chloride  0.9 % bolus 1,000 mL (0 mLs Intravenous Stopped 01/30/24 1435)  metoCLOPramide  (REGLAN ) injection 10 mg (10 mg Intravenous Given 01/30/24 1311)     IMPRESSION / MDM / ASSESSMENT AND PLAN / ED COURSE  I reviewed the triage vital signs and the nursing notes.   Differential diagnosis includes, but is not limited to, pregnancy first trimester, emesis, urinary tract infection, viral illness  28 year old female presents to the ED after taking a home pregnancy test which was positive.  She is also has continued to have vomiting for the last 2 days.  IV fluids were given and patient responded to IV Reglan  as she states that Zofran  in the past with her pregnancies has not helped any.  She has in the past taken diplegia over-the-counter but states that this is very expensive to obtain as it is in very little bottles.  Lab work was reassuring with her potassium slightly decreased due to vomiting and patient was made aware.  Urinalysis was negative for infection and positive for pregnancy.  Her beta hCG was 80,026.  Ultrasound was deferred at this time as  patient is not having any abdominal pain, vaginal bleeding or discharge.  Patient agrees that she will follow-up with her OB/GYN.  We discussed clear fluids especially ginger ale which may also help with her nausea.  A prescription for Reglan  and Diclegis  check was sent to the pharmacy.  She is return to the emergency department if any severe worsening of her symptoms.      Patient's presentation is most consistent with acute complicated illness / injury requiring diagnostic workup.  FINAL CLINICAL IMPRESSION(S) / ED DIAGNOSES   Final diagnoses:  First trimester pregnancy  Hyperemesis arising during pregnancy     Rx / DC Orders   ED Discharge Orders          Ordered    Doxylamine -Pyridoxine  10-10 MG TBEC        01/30/24 1421    metoCLOPramide  (REGLAN ) 5 MG tablet  2 times daily        01/30/24 1421             Note:  This document was prepared using Dragon voice recognition software and may include unintentional dictation errors.   Saunders Shona CROME, PA-C 01/30/24 1458    Clarine Ozell LABOR, MD 02/02/24 [phone]

## 2024-02-03 ENCOUNTER — Telehealth: Payer: Self-pay

## 2024-02-03 NOTE — Telephone Encounter (Signed)
 Patient called she was evaluated at ED on 01/30/2024 for Hyperemesis. She woke up this morning and she has some spotting. She only notices the spotting when she wipes. She denies abdominal pain or cramping. I advised patient to continue to monitor her bleeding, if it becomes worse and she is filling a pad every 30 minutes to 1 hour with severe abdominal pain to return to ED for further evaluation. Patient verbalized understanding.

## 2024-02-07 ENCOUNTER — Encounter: Payer: Self-pay | Admitting: Emergency Medicine

## 2024-02-07 ENCOUNTER — Other Ambulatory Visit: Payer: Self-pay

## 2024-02-07 ENCOUNTER — Emergency Department

## 2024-02-07 ENCOUNTER — Emergency Department
Admission: EM | Admit: 2024-02-07 | Discharge: 2024-02-07 | Disposition: A | Discharge status: Home / Self Care | Attending: Emergency Medicine | Admitting: Emergency Medicine

## 2024-02-07 DIAGNOSIS — M545 Low back pain, unspecified: Secondary | ICD-10-CM | POA: Insufficient documentation

## 2024-02-07 DIAGNOSIS — R102 Pelvic and perineal pain: Secondary | ICD-10-CM | POA: Diagnosis not present

## 2024-02-07 DIAGNOSIS — Z3A01 Less than 8 weeks gestation of pregnancy: Secondary | ICD-10-CM | POA: Diagnosis not present

## 2024-02-07 DIAGNOSIS — O219 Vomiting of pregnancy, unspecified: Secondary | ICD-10-CM | POA: Diagnosis present

## 2024-02-07 LAB — COMPREHENSIVE METABOLIC PANEL WITH GFR
ALT: 29 U/L (ref 0–44)
AST: 24 U/L (ref 15–41)
Albumin: 4.3 g/dL (ref 3.5–5.0)
Alkaline Phosphatase: 48 U/L (ref 38–126)
Anion gap: 10 (ref 5–15)
BUN: 11 mg/dL (ref 6–20)
CO2: 21 mmol/L — ABNORMAL LOW (ref 22–32)
Calcium: 9.3 mg/dL (ref 8.9–10.3)
Chloride: 104 mmol/L (ref 98–111)
Creatinine, Ser: 0.45 mg/dL (ref 0.44–1.00)
GFR, Estimated: >60 mL/min (ref >60)
Glucose, Bld: 113 mg/dL — ABNORMAL HIGH (ref 70–99)
Potassium: 3.5 mmol/L (ref 3.5–5.1)
Sodium: 135 mmol/L (ref 135–145)
Total Bilirubin: 0.7 mg/dL (ref 0.0–1.2)
Total Protein: 7.8 g/dL (ref 6.5–8.1)

## 2024-02-07 LAB — URINALYSIS, ROUTINE W REFLEX MICROSCOPIC
Bilirubin Urine: NEGATIVE
Glucose, UA: NEGATIVE mg/dL
Ketones, ur: 20 mg/dL — AB
Nitrite: NEGATIVE
Protein, ur: 30 mg/dL — AB
Specific Gravity, Urine: 1.031 — ABNORMAL HIGH (ref 1.005–1.030)
pH: 5 (ref 5.0–8.0)

## 2024-02-07 LAB — CBC
HCT: 41 % (ref 36.0–46.0)
Hemoglobin: 14.4 g/dL (ref 12.0–15.0)
MCH: 29.1 pg (ref 26.0–34.0)
MCHC: 35.1 g/dL (ref 30.0–36.0)
MCV: 83 fL (ref 80.0–100.0)
Platelets: 237 K/uL (ref 150–400)
RBC: 4.94 MIL/uL (ref 3.87–5.11)
RDW: 13.4 % (ref 11.5–15.5)
WBC: 9.5 K/uL (ref 4.0–10.5)
nRBC: 0 % (ref 0.0–0.2)

## 2024-02-07 LAB — HCG, QUANTITATIVE, PREGNANCY: hCG, Beta Chain, Quant, S: >250000 m[IU]/mL — ABNORMAL HIGH (ref <5)

## 2024-02-07 LAB — LIPASE, BLOOD: Lipase: 28 U/L (ref 11–51)

## 2024-02-07 LAB — CK: Total CK: 46 U/L (ref 38–234)

## 2024-02-07 LAB — ABO/RH: ABO/RH(D): O POS

## 2024-02-07 MED ORDER — LIDOCAINE 5 % EX PTCH
1.0000 | MEDICATED_PATCH | Freq: Once | CUTANEOUS | Status: DC
Start: 2024-02-07 — End: 2024-02-07
  Administered 2024-02-07: 1 via TRANSDERMAL
  Filled 2024-02-07: qty 1

## 2024-02-07 MED ORDER — SODIUM CHLORIDE 0.9 % IV BOLUS
500.0000 mL | Freq: Once | INTRAVENOUS | Status: AC
  Administered 2024-02-07: 500 mL via INTRAVENOUS

## 2024-02-07 MED ORDER — SODIUM CHLORIDE 0.9 % IV BOLUS
1000.0000 mL | Freq: Once | INTRAVENOUS | Status: AC
  Administered 2024-02-07: 1000 mL via INTRAVENOUS

## 2024-02-07 MED ORDER — METOCLOPRAMIDE HCL 5 MG/ML IJ SOLN
10.0000 mg | Freq: Once | INTRAMUSCULAR | Status: AC
  Administered 2024-02-07: 10 mg via INTRAVENOUS
  Filled 2024-02-07: qty 2

## 2024-02-07 NOTE — ED Triage Notes (Signed)
 Patient to ED via POV for emesis x4 days. States unable to keep anything down. States approx [redacted] weeks pregnant.  C/o lower back/ pelvic pain. Seen for same on 7/10.

## 2024-02-07 NOTE — ED Provider Notes (Signed)
 [facility] Provider Note    Event Date/Time   First MD Initiated Contact with Patient 02/07/24 1042     (approximate)   History   Emesis During Pregnancy   HPI  Gina Lester is a 28 y.o. female G5, P3 presents emergency department with abdominal pain, vomiting.  Patient states her back is also hurting.  States her car quit working and she had to walk home from work which took her an hour and a half.  This was in the heat yesterday.  States pain in her muscles in her back are worsening.  Some of the abdominal pain at the pelvis.  No bleeding yet.  No fever or chills.      Physical Exam   Triage Vital Signs: ED Triage Vitals  Encounter Vitals Group     BP 02/07/24 1004 118/73     Girls Systolic BP Percentile --      Girls Diastolic BP Percentile --      Boys Systolic BP Percentile --      Boys Diastolic BP Percentile --      Pulse Rate 02/07/24 1004 (!) 103     Resp 02/07/24 1004 17     Temp 02/07/24 1004 98.4 F (36.9 C)     Temp Source 02/07/24 1004 Oral     SpO2 02/07/24 1004 100 %     Weight 02/07/24 1005 135 lb (61.2 kg)     Height 02/07/24 1005 5' (1.524 m)     Head Circumference --      Peak Flow --      Pain Score 02/07/24 1005 5     Pain Loc --      Pain Education --      Exclude from Growth Chart --     Most recent vital signs: Vitals:   02/07/24 1319 02/07/24 1347  BP: 118/76 118/74  Pulse: 98 96  Resp: 17 17  Temp: 98.1 F (36.7 C) 98.1 F (36.7 C)  SpO2: 100% 100%     General: Awake, no distress.   CV:  Good peripheral perfusion. Resp:  Normal effort.  Abd:  No distention.  Tender in the lower abdomen Other:  Lumbar spine musculature is tender to palpation, neurovascular intact   ED Results / Procedures / Treatments   Labs (all labs ordered are listed, but only abnormal results are displayed) Labs Reviewed  COMPREHENSIVE METABOLIC PANEL WITH GFR - Abnormal; Notable for the following components:       Result Value   CO2 21 (*)    Glucose, Bld 113 (*)    All other components within normal limits  URINALYSIS, ROUTINE W REFLEX MICROSCOPIC - Abnormal; Notable for the following components:   Color, Urine AMBER (*)    APPearance HAZY (*)    Specific Gravity, Urine 1.031 (*)    Hgb urine dipstick SMALL (*)    Ketones, ur 20 (*)    Protein, ur 30 (*)    Leukocytes,Ua SMALL (*)    Bacteria, UA RARE (*)    All other components within normal limits  HCG, QUANTITATIVE, PREGNANCY - Abnormal; Notable for the following components:   hCG, Beta Chain, Quant, S >250,000 (*)    All other components within normal limits  LIPASE, BLOOD  CBC  CK  ABO/RH     EKG     RADIOLOGY Ultrasound OB less than 14 weeks    PROCEDURES:   Procedures  Critical Care:  no Chief Complaint  Patient presents with   Emesis During Pregnancy      MEDICATIONS ORDERED IN ED: Medications  lidocaine  (LIDODERM ) 5 % 1 patch (1 patch Transdermal Patch Applied 02/07/24 1317)  sodium chloride  0.9 % bolus 1,000 mL (0 mLs Intravenous Stopped 02/07/24 1320)  metoCLOPramide  (REGLAN ) injection 10 mg (10 mg Intravenous Given 02/07/24 1059)  sodium chloride  0.9 % bolus 500 mL (0 mLs Intravenous Stopped 02/07/24 1347)  metoCLOPramide  (REGLAN ) injection 10 mg (10 mg Intravenous Given 02/07/24 1317)     IMPRESSION / MDM / ASSESSMENT AND PLAN / ED COURSE  I reviewed the triage vital signs and the nursing notes.                              Differential diagnosis includes, but is not limited to, vomiting in pregnancy, dehydration, rhabdomyolysis, ectopic pregnancy, threatened miscarriage, muscle strain  Patient's presentation is most consistent with acute illness / injury with system symptoms.    Medications given: Reglan  10 mg IV, normal saline 1 L IV  Labs and imaging ordered  Labs reassuring, this indicates no rhabdomyolysis, ultrasound shows IUP so no ectopic  Ultrasound OB less than 14 weeks shows a 6-week  Vitile fetus, IUP, small subchorionic hematoma, this was independently reviewed interpreted by me  Explained explained to patient.  Since she is still feeling a little dehydrated we will give her 500 mL of saline in addition to the 1 L.  Additional Reglan  if she is feeling nauseous again.  She does have Reglan  at home.  Did encourage her to find a ride to work instead of walking for 1-1/2 hours and has heat.  Patient states she understands.  Work note given.  Patient was discharged stable condition.  Strict instructions to return for worsening.      FINAL CLINICAL IMPRESSION(S) / ED DIAGNOSES   Final diagnoses:  Excessive vomiting in pregnancy     Rx / DC Orders   ED Discharge Orders     None        Note:  This document was prepared using Dragon voice recognition software and may include unintentional dictation errors.    Gasper Devere ORN, PA-C 02/07/24 1500    Dorothyann Drivers, MD 02/07/24 1511

## 2024-02-07 NOTE — ED Notes (Signed)
 Patient is tearful on the stretcher. Patient states she didn't receive the Reglan  until Monday, 7/14. Patient states it initially worked, along with Unisom  and vitamin B6, but hasn't been able to eat for 4 days due to nausea and vomiting. Patient states her car stopped working last night and she had to walk home from work. Patient now c/o lower abdominal pain and back pain.

## 2024-02-08 ENCOUNTER — Encounter: Admitting: Nurse Practitioner

## 2024-02-08 ENCOUNTER — Emergency Department

## 2024-02-08 ENCOUNTER — Other Ambulatory Visit: Payer: Self-pay

## 2024-02-08 ENCOUNTER — Emergency Department
Admission: EM | Admit: 2024-02-08 | Discharge: 2024-02-08 | Disposition: A | Discharge status: Home / Self Care | Attending: Emergency Medicine | Admitting: Emergency Medicine

## 2024-02-08 DIAGNOSIS — O2691 Pregnancy related conditions, unspecified, first trimester: Secondary | ICD-10-CM

## 2024-02-08 DIAGNOSIS — Z3A01 Less than 8 weeks gestation of pregnancy: Secondary | ICD-10-CM | POA: Insufficient documentation

## 2024-02-08 DIAGNOSIS — O209 Hemorrhage in early pregnancy, unspecified: Secondary | ICD-10-CM | POA: Diagnosis present

## 2024-02-08 DIAGNOSIS — O200 Threatened abortion: Secondary | ICD-10-CM | POA: Insufficient documentation

## 2024-02-08 LAB — CBC
HCT: 40.1 % (ref 36.0–46.0)
Hemoglobin: 13.8 g/dL (ref 12.0–15.0)
MCH: 28.9 pg (ref 26.0–34.0)
MCHC: 34.4 g/dL (ref 30.0–36.0)
MCV: 83.9 fL (ref 80.0–100.0)
Platelets: 234 K/uL (ref 150–400)
RBC: 4.78 MIL/uL (ref 3.87–5.11)
RDW: 13.2 % (ref 11.5–15.5)
WBC: 10.8 K/uL — ABNORMAL HIGH (ref 4.0–10.5)
nRBC: 0 % (ref 0.0–0.2)

## 2024-02-08 LAB — BASIC METABOLIC PANEL WITH GFR
Anion gap: 12 (ref 5–15)
BUN: 8 mg/dL (ref 6–20)
CO2: 21 mmol/L — ABNORMAL LOW (ref 22–32)
Calcium: 9 mg/dL (ref 8.9–10.3)
Chloride: 103 mmol/L (ref 98–111)
Creatinine, Ser: 0.62 mg/dL (ref 0.44–1.00)
GFR, Estimated: >60 mL/min (ref >60)
Glucose, Bld: 94 mg/dL (ref 70–99)
Potassium: 3.1 mmol/L — ABNORMAL LOW (ref 3.5–5.1)
Sodium: 136 mmol/L (ref 135–145)

## 2024-02-08 LAB — HCG, QUANTITATIVE, PREGNANCY: hCG, Beta Chain, Quant, S: >250000 m[IU]/mL — ABNORMAL HIGH (ref <5)

## 2024-02-08 MED ORDER — POTASSIUM CHLORIDE CRYS ER 20 MEQ PO TBCR
40.0000 meq | EXTENDED_RELEASE_TABLET | Freq: Once | ORAL | Status: AC
  Administered 2024-02-08: 40 meq via ORAL
  Filled 2024-02-08: qty 2

## 2024-02-08 NOTE — ED Provider Notes (Signed)
 [facility] Provider Note    Event Date/Time   First MD Initiated Contact with Patient 02/08/24 1758     (approximate)   History   Chief Complaint Vaginal Bleeding   HPI  Gina Lester is a 28 y.o. female [phone] at approximately 6 weeks of pregnancy who presents to the ED complaining of vaginal bleeding.  Patient reports that she woke up this morning with spotting of blood when she went to wipe.  This was associated with intermittent crampy pain in her pelvis.  Bleeding seem to get more severe along with additional cramping, until she passed some dark tissue, subsequently had improvement in bleeding and cramping.  She was seen in the ED for nausea and vomiting with pregnancy yesterday, states that nausea has resolved as the cramping and bleeding have improved.  She has not had any fevers, dysuria, or flank pain.  Her first OB appointment is scheduled in 2 days.     Physical Exam   Triage Vital Signs: ED Triage Vitals [02/08/24 1743]  Encounter Vitals Group     BP 120/82     Girls Systolic BP Percentile      Girls Diastolic BP Percentile      Boys Systolic BP Percentile      Boys Diastolic BP Percentile      Pulse Rate 78     Resp 18     Temp 98.1 F (36.7 C)     Temp Source Oral     SpO2 99 %     Weight 130 lb (59 kg)     Height 5' (1.524 m)     Head Circumference      Peak Flow      Pain Score 0     Pain Loc      Pain Education      Exclude from Growth Chart     Most recent vital signs: Vitals:   02/08/24 1743  BP: 120/82  Pulse: 78  Resp: 18  Temp: 98.1 F (36.7 C)  SpO2: 99%    Constitutional: Alert and oriented. Eyes: Conjunctivae are normal. Head: Atraumatic. Nose: No congestion/rhinnorhea. Mouth/Throat: Mucous membranes are moist.  Cardiovascular: Normal rate, regular rhythm. Grossly normal heart sounds.  2+ radial pulses bilaterally. Respiratory: Normal respiratory effort.  No retractions. Lungs  CTAB. Gastrointestinal: Soft and nontender. No distention. Musculoskeletal: No lower extremity tenderness nor edema.  Neurologic:  Normal speech and language. No gross focal neurologic deficits are appreciated.    ED Results / Procedures / Treatments   Labs (all labs ordered are listed, but only abnormal results are displayed) Labs Reviewed  CBC - Abnormal; Notable for the following components:      Result Value   WBC 10.8 (*)    All other components within normal limits  BASIC METABOLIC PANEL WITH GFR - Abnormal; Notable for the following components:   Potassium 3.1 (*)    CO2 21 (*)    All other components within normal limits  HCG, QUANTITATIVE, PREGNANCY - Abnormal; Notable for the following components:   hCG, Beta Chain, Quant, S >250,000 (*)    All other components within normal limits    RADIOLOGY OB ultrasound reviewed and interpreted by me with intrauterine pregnancy.  PROCEDURES:  Critical Care performed: No  Procedures   MEDICATIONS ORDERED IN ED: Medications  potassium chloride  SA (KLOR-CON  M) CR tablet 40 mEq (40 mEq Oral Given 02/08/24 1830)     IMPRESSION / MDM / ASSESSMENT AND PLAN /  ED COURSE  I reviewed the triage vital signs and the nursing notes.                              28 y.o. female [phone] at approximately 6 weeks of pregnancy who presents to the ED complaining of crampy pain and vaginal bleeding earlier today that has since resolved.  Patient's presentation is most consistent with acute presentation with potential threat to life or bodily function.  Differential diagnosis includes, but is not limited to, ectopic pregnancy, threatened miscarriage, completed miscarriage, anemia, electrolyte abnormality, AKI.  Patient nontoxic-appearing and in no acute distress, vital signs are unremarkable.  Her abdominal exam is benign and she states that pain has resolved at this point.  We will check repeat ultrasound and trend hCG levels.  Labs without  significant anemia, leukocytosis, electrolyte abnormality, or AKI.  Beta-hCG levels unchanged from previous, ultrasound shows intrauterine pregnancy with cardiac activity.  Patient with no recurrent pain or bleeding on reassessment and is appropriate for discharge home with outpatient follow-up.  She was counseled to return to the ED for new or worsening symptoms, patient agrees with plan.      FINAL CLINICAL IMPRESSION(S) / ED DIAGNOSES   Final diagnoses:  Threatened miscarriage     Rx / DC Orders   ED Discharge Orders     None        Note:  This document was prepared using Dragon voice recognition software and may include unintentional dictation errors.   Willo Dunnings, MD 02/08/24 WINDELL

## 2024-02-08 NOTE — ED Triage Notes (Signed)
 Pt states she started having vaginal bleeding this morning that started to get more heavy throughout the day but states has stopped now. Pt states back pain but ABD pain. Pt is 6 weeks and has her first OB 7/21.

## 2024-02-08 NOTE — Progress Notes (Signed)
 I have spent 5 minutes in review of e-visit questionnaire, review and updating patient chart, medical decision making and response to patient.   Claiborne Rigg, NP

## 2024-02-08 NOTE — Progress Notes (Signed)
  Because we do not treat any pregnancy related complications, I feel your condition warrants further evaluation and I recommend that you be seen in a face-to-face visit.   NOTE: There will be NO CHARGE for this E-Visit   If you are having a true medical emergency, please call 911.     For an urgent face to face visit, [facility] has multiple urgent care centers for your convenience.  Click the link below for the full list of locations and hours, walk-in wait times, appointment scheduling options and driving directions:  Urgent Care - [place], [place], [name], [place], [place], [place]  [place]     Your MyChart E-visit questionnaire answers were reviewed by a board certified advanced clinical practitioner to complete your personal care plan based on your specific symptoms.    Thank you for using e-Visits.

## 2024-02-10 ENCOUNTER — Telehealth

## 2024-02-10 DIAGNOSIS — O3680X1 Pregnancy with inconclusive fetal viability, fetus 1: Secondary | ICD-10-CM | POA: Diagnosis not present

## 2024-02-10 DIAGNOSIS — Z3A01 Less than 8 weeks gestation of pregnancy: Secondary | ICD-10-CM | POA: Diagnosis not present

## 2024-02-10 NOTE — Progress Notes (Cosign Needed Addendum)
 New OB Intake  I connected with  Gina Lester on 02/10/24 at  2:15 PM EDT by MyChart Video Visit and verified that I am speaking with the correct person using two identifiers. Nurse is located at [facility] and pt is located at home.  I discussed the limitations, risks, security and privacy concerns of performing an evaluation and management service by telephone and the availability of in person appointments. I also discussed with the patient that there may be a patient responsible charge related to this service. The patient expressed understanding and agreed to proceed.  I explained I am completing New OB Intake today. We discussed her EDD of 09/22/24 that is based on LMP of 12/17/23. Pt is G5/P3013. I reviewed her allergies, medications, Medical/Surgical/OB history, and appropriate screenings. There are no cats in the home. Based on history, this is a/an pregnancy uncomplicated . Her obstetrical history is significant for recent chlamydia, h/o oligohydramnios.  Patient Active Problem List   Diagnosis Date Noted   STD (sexually transmitted disease) complicating pregnancy, antepartum 04/08/2022   Supervision of other normal pregnancy, antepartum 03/22/2022   Susceptible to varicella (non-immune), currently pregnant 05/19/2015    Concerns addressed today:   Delivery Plans:  Plans to deliver at [facility].  Anatomy US  Explained first scheduled US  will be 02/24/24. Anatomy US  will be scheduled around [redacted] weeks gestational age.  Labs Discussed genetic screening with patient. Patient desires genetic testing to be drawn at new OB visit. Discussed possible labs to be drawn at new OB appointment.  COVID Vaccine Patient has not had COVID vaccine.   Social Determinants of Health Food Insecurity: denies food insecurity WIC Referral: Patient is not interested in referral to [facility].  Transportation: Patient denies transportation needs. Childcare: Discussed no children  allowed at ultrasound appointments.   First visit review I reviewed new OB appt with pt. I explained she will have blood work and pap smear/pelvic exam if indicated. Explained pt will be seen by Damien Parsley, CNM at first visit; encounter routed to appropriate provider.   [place], [place] 2/78/7974  4:80 PM

## 2024-02-10 NOTE — Patient Instructions (Signed)
 When Blood Forms Between the Amniotic Sac and the Uterus (Subchorionic Hematoma): What to Know  A subchorionic hematoma is when blood forms between the amniotic sac and the uterus. The amniotic sac is a bag of water that surrounds your baby.  This condition happens when the sac tears away from the uterus wall. This problem usually happens in the first trimester, or the early days of the second trimester. Small hematomas seen early in your pregnancy usually get smaller on their own and don't cause any problems. But the problem can be serious if the bleeding starts later in your pregnancy, the hematoma is large, or you're pregnant at an older age. This problem can increase the risk of: Miscarriage. Premature separation of the placenta from the uterus. Premature or preterm labor. Stillbirth. What are the causes? The cause of this problem is not known. It occurs when blood is trapped between the amniotic sac and the wall of the uterus. This happens when the sac becomes separated from the wall of the uterus. What increases the risk? Blood is more likely to form between the amniotic sac and the uterus if: You were treated with fertility medicines. You became pregnant through in vitro fertilization (IVF). You have a condition that affects the ability of your blood to clot. You have a condition that causes your body's defense system (immune system) not to work as it should. What are the signs or symptoms? Symptoms of this condition include: Bleeding or spotting from your vagina. This is the main sign of this problem. Pain in your belly. This is rare. Some people don't have symptoms. In this case, bleeding may only be seen when ultrasound pictures are taken. How is this diagnosed? This problem is diagnosed based on a physical exam. This includes looking at your belly, your uterus, and your vagina. Other tests you may have are: Blood tests. Urine tests. Ultrasound of your belly. How is this  treated? There may be different treatments available. Treatment may include: Watchful waiting. This is where your health care provider watches you closely for any changes in bleeding. Medicines. Activity limits. This may be needed until the bleeding stops. A medicine called Rh immunoglobulin. This is given if you have an Rh-negative blood type. It prevents Rh sensitization. Follow these instructions at home: Stay on bed rest if told to do so by your provider. Ask if it's OK for you to lift. Track and write down the number of pads you use each day and how soaked or full they are. Do not have sex until your provider says you can. Do not douche. Do not use tampons. Ask what things are safe for you to do at home. Ask when you can go back to work or school. Keep all follow-up visits. Your provider may ask you to have follow-up blood tests or ultrasound tests or both. Contact a health care provider if: You have any bleeding in your vagina. You have a fever. Get help right away if: You have very bad cramps in your stomach, back, belly, or pelvis. You pass large clots or tissue. Save any tissue for your provider to look at. You faint. You become light-headed or weak. This information is not intended to replace advice given to you by your health care provider. Make sure you discuss any questions you have with your health care provider. Document Revised: 06/28/2023 Document Reviewed: 06/28/2023 Elsevier Patient Education  2025 Else[employer]First Trimester of Pregnancy  The first trimester of pregnancy starts on the first  day of your last monthly period until the end of week 13. This is months 1 through 3 of pregnancy. A week after a sperm fertilizes an egg, the egg will implant into the wall of the uterus and begin to develop into a baby. Body changes during your first trimester Your body goes through many changes during pregnancy. The changes usually return to normal after your baby is  born. Physical changes Your breasts may grow larger and may hurt. The area around your nipples may get darker. Your periods will stop. Your hair and nails may grow faster. You may pee more often. Health changes You may tire easily. Your gums may bleed and may be sensitive when you brush and floss. You may not feel hungry. You may have heartburn. You may throw up or feel like you may throw up. You may want to eat some foods, but not others. You may have headaches. You may have trouble pooping (constipation). Other changes Your emotions may change from day to day. You may have more dreams. Follow these instructions at home: Medicines Talk to your health care provider if you're taking medicines. Ask if the medicines are safe to take during pregnancy. Your provider may change the medicines that you take. Do not take any medicines unless told to by your provider. Take a prenatal vitamin that has at least 600 micrograms (mcg) of folic acid. Do not use herbal medicines, illegal substances, or medicines that are not approved by your provider. Eating and drinking While you're pregnant your body needs extra food for your growing baby. Talk with your provider about what to eat while pregnant. Activity Most women are able to exercise during pregnancy. Exercises may need to change as your pregnancy goes on. Talk to your provider about your activities and exercise routines. Relieving pain and discomfort Wear a good, supportive bra if your breasts hurt. Rest with your legs raised if you have leg cramps or low back pain. Safety Wear your seatbelt at all times when you're in a car. Talk to your provider if someone hits you, hurts you, or yells at you. Talk with your provider if you're feeling sad or have thoughts of hurting yourself. Lifestyle Certain things can be harmful while you're pregnant. Follow these rules: Do not use hot tubs, steam rooms, or saunas. Do not douche. Do not use tampons  or scented pads. Do not drink alcohol,smoke, vape, or use products with nicotine  or tobacco in them. If you need help quitting, talk with your provider. Avoid cat litter boxes and soil used by cats. These things carry germs that can cause harm to your pregnancy and your baby. General instructions Keep all follow-up visits. It helps you and your unborn baby stay as healthy as possible. Write down your questions. Take them to your visits. Your provider will: Talk with you about your overall health. Give you advice or refer you to specialists who can help with different needs, including: Prenatal education classes. Mental health and counseling. Foods and healthy eating. Ask for help if you need help with food. Call your dentist and ask to be seen. Brush your teeth with a soft toothbrush. Floss gently. Where to find more information American Pregnancy Association: americanpregnancy.org [employer]: acog.org Office on [employer] Health: [employer] Contact a health care provider if: You feel dizzy, faint, or have a fever. You vomit or have watery poop (diarrhea) for 2 days or more. You have abnormal discharge or bleeding from your vagina. You  have pain when you pee or your pee smells bad. You have cramps, pain, or pressure in your belly area. Get help right away if: You have trouble breathing or chest pain. You have any kind of injury, such as from a fall or a car crash. These symptoms may be an emergency. Get help right away. Call 911. Do not wait to see if the symptoms will go away. Do not drive yourself to the hospital. This information is not intended to replace advice given to you by your health care provider. Make sure you discuss any questions you have with your health care provider. Document Revised: 04/11/2023 Document Reviewed: 11/09/2022 Elsevier Patient Education  2024 Else[employer] Inc. Commonly Asked Questions During Pregnancy  Cats: A  parasite can be excreted in cat feces.  To avoid exposure you need to have another person empty the little box.  If you must empty the litter box you will need to wear gloves.  Wash your hands after handling your cat.  This parasite can also be found in raw or undercooked meat so this should also be avoided.  Colds, Sore Throats, Flu: Please check your medication sheet to see what you can take for symptoms.  If your symptoms are unrelieved by these medications please call the office.  Dental Work: Most any dental work Agricultural consultant recommends is permitted.  X-rays should only be taken during the first trimester if absolutely necessary.  Your abdomen should be shielded with a lead apron during all x-rays.  Please notify your provider prior to receiving any x-rays.  Novocaine is fine; gas is not recommended.  If your dentist requires a note from us  prior to dental work please call the office and we will provide one for you.  Exercise: Exercise is an important part of staying healthy during your pregnancy.  You may continue most exercises you were accustomed to prior to pregnancy.  Later in your pregnancy you will most likely notice you have difficulty with activities requiring balance like riding a bicycle.  It is important that you listen to your body and avoid activities that put you at a higher risk of falling.  Adequate rest and staying well hydrated are a must!  If you have questions about the safety of specific activities ask your provider.    Exposure to Children with illness: Try to avoid obvious exposure; report any symptoms to us  when noted,  If you have chicken pos, red measles or mumps, you should be immune to these diseases.   Please do not take any vaccines while pregnant unless you have checked with your OB provider.  Fetal Movement: After 28 weeks we recommend you do kick counts twice daily.  Lie or sit down in a calm quiet environment and count your baby movements kicks.  You should feel  your baby at least 10 times per hour.  If you have not felt 10 kicks within the first hour get up, walk around and have something sweet to eat or drink then repeat for an additional hour.  If count remains less than 10 per hour notify your provider.  Fumigating: Follow your pest control agent's advice as to how long to stay out of your home.  Ventilate the area well before re-entering.  Hemorrhoids:   Most over-the-counter preparations can be used during pregnancy.  Check your medication to see what is safe to use.  It is important to use a stool softener or fiber in your diet and to drink lots of  liquids.  If hemorrhoids seem to be getting worse please call the office.   Hot Tubs:  Hot tubs Jacuzzis and saunas are not recommended while pregnant.  These increase your internal body temperature and should be avoided.  Intercourse:  Sexual intercourse is safe during pregnancy as long as you are comfortable, unless otherwise advised by your provider.  Spotting may occur after intercourse; report any bright red bleeding that is heavier than spotting.  Labor:  If you know that you are in labor, please go to the hospital.  If you are unsure, please call the office and let us  help you decide what to do.  Lifting, straining, etc:  If your job requires heavy lifting or straining please check with your provider for any limitations.  Generally, you should not lift items heavier than that you can lift simply with your hands and arms (no back muscles)  Painting:  Paint fumes do not harm your pregnancy, but may make you ill and should be avoided if possible.  Latex or water based paints have less odor than oils.  Use adequate ventilation while painting.  Permanents & Hair Color:  Chemicals in hair dyes are not recommended as they cause increase hair dryness which can increase hair loss during pregnancy.   Highlighting and permanents are allowed.  Dye may be absorbed differently and permanents may not hold as well  during pregnancy.  Sunbathing:  Use a sunscreen, as skin burns easily during pregnancy.  Drink plenty of fluids; avoid over heating.  Tanning Beds:  Because their possible side effects are still unknown, tanning beds are not recommended.  Ultrasound Scans:  Routine ultrasounds are performed at approximately 20 weeks.  You will be able to see your baby's general anatomy an if you would like to know the gender this can usually be determined as well.  If it is questionable when you conceived you may also receive an ultrasound early in your pregnancy for dating purposes.  Otherwise ultrasound exams are not routinely performed unless there is a medical necessity.  Although you can request a scan we ask that you pay for it when conducted because insurance does not cover  patient request scans.  Work: If your pregnancy proceeds without complications you may work until your due date, unless your physician or employer advises otherwise.  Round Ligament Pain/Pelvic Discomfort:  Sharp, shooting pains not associated with bleeding are fairly common, usually occurring in the second trimester of pregnancy.  They tend to be worse when standing up or when you remain standing for long periods of time.  These are the result of pressure of certain pelvic ligaments called round ligaments.  Rest, Tylenol  and heat seem to be the most effective relief.  As the womb and fetus grow, they rise out of the pelvis and the discomfort improves.  Please notify the office if your pain seems different than that described.  It may represent a more serious condition.   Common Medications Safe in Pregnancy  Acne:      Constipation:  Benzoyl Peroxide     Colace  Clindamycin      Dulcolax Suppository  Topica Erythromycin     Fibercon  Salicylic Acid      Metamucil         Miralax  AVOID:        Senakot   Accutane    Cough:  Retin-A       Cough Drops  Tetracycline      Phenergan  w/ Codeine if  Rx  Minocycline      Robitussin  (Plain & DM)  Antibiotics:     Crabs/Lice:  Ceclor       RID  Cephalosporins    AVOID:  E-Mycins      Kwell  Keflex   Macrobid/Macrodantin   Diarrhea:  Penicillin      Kao-Pectate  Zithromax       Imodium AD         PUSH FLUIDS AVOID:       Cipro      Fever:  Tetracycline      Tylenol  (Regular or Extra  Minocycline       Strength)  Levaquin      Extra Strength-Do not          Exceed 8 tabs/24 hrs Caffeine:        200mg /day (equiv. To 1 cup of coffee or  approx. 3 12 oz sodas)         Gas: Cold/Hayfever:       Gas-X  Benadryl       Mylicon  Claritin        Phazyme  **Claritin -D        Chlor-Trimeton    Headaches:  Dimetapp      ASA-Free Excedrin  Drixoral-Non-Drowsy     Cold Compress  Mucinex (Guaifenasin)     Tylenol  (Regular or Extra  Sudafed/Sudafed-12 Hour     Strength)  **Sudafed PE Pseudoephedrine   Tylenol  Cold & Sinus     Vicks Vapor Rub  Zyrtec   **AVOID if Problems With Blood Pressure         Heartburn: Avoid lying down for at least 1 hour after meals  Aciphex      Maalox     Rash:  Milk of Magnesia     Benadryl     Mylanta       1% Hydrocortisone Cream  Pepcid   Pepcid  Complete   Sleep Aids:  Prevacid      Ambien    Prilosec       Benadryl   Rolaids       Chamomile Tea  Tums (Limit 4/day)     Unisom          Tylenol  PM         Warm milk-add vanilla or  Hemorrhoids:       Sugar for taste  Anusol/Anusol H.C.  (RX: Analapram 2.5%)  Sugar Substitutes:  Hydrocortisone OTC     Ok in moderation  Preparation H      Tucks        Vaseline lotion applied to tissue with wiping    Herpes:     Throat:  Acyclovir      Oragel  Famvir  Valtrex     Vaccines:         Flu Shot Leg Cramps:       *Gardasil  Benadryl       Hepatitis A         Hepatitis B Nasal Spray:       Pneumovax  Saline Nasal Spray     Polio Booster         Tetanus Nausea:       Tuberculosis test or PPD  Vitamin B6 25 mg TID   AVOID:    Dramamine      *Gardasil  Emetrol       Live  Poliovirus  Ginger Root 250 mg QID    MMR (measles, mumps &  High Complex Carbs @ Bedtime    rebella)  Sea Bands-Accupressure  Varicella (Chickenpox)  Unisom  1/2 tab TID     *No known complications           If received before Pain:         Known pregnancy;   Darvocet       Resume series after  Lortab        Delivery  Percocet    Yeast:   Tramadol       Femstat  Tylenol  3      Gyne-lotrimin  Ultram        Monistat  Vicodin           MISC:         All Sunscreens           Hair Coloring/highlights          Insect Repellant's          (Including DEET)         Mystic Tans

## 2024-02-24 ENCOUNTER — Ambulatory Visit

## 2024-02-24 DIAGNOSIS — O3680X1 Pregnancy with inconclusive fetal viability, fetus 1: Secondary | ICD-10-CM

## 2024-02-24 DIAGNOSIS — O3680X0 Pregnancy with inconclusive fetal viability, not applicable or unspecified: Secondary | ICD-10-CM | POA: Diagnosis not present

## 2024-02-24 DIAGNOSIS — Z3A08 8 weeks gestation of pregnancy: Secondary | ICD-10-CM

## 2024-03-10 ENCOUNTER — Ambulatory Visit (INDEPENDENT_AMBULATORY_CARE_PROVIDER_SITE_OTHER): Admitting: Certified Nurse Midwife

## 2024-03-10 ENCOUNTER — Other Ambulatory Visit (HOSPITAL_COMMUNITY)
Admission: RE | Admit: 2024-03-10 | Discharge: 2024-03-10 | Disposition: A | Discharge status: Home / Self Care | Source: Ambulatory Visit | Attending: Certified Nurse Midwife | Admitting: Certified Nurse Midwife

## 2024-03-10 VITALS — BP 108/71 | HR 59 | Wt 118.8 lb

## 2024-03-10 DIAGNOSIS — Z3481 Encounter for supervision of other normal pregnancy, first trimester: Secondary | ICD-10-CM | POA: Insufficient documentation

## 2024-03-10 DIAGNOSIS — Z124 Encounter for screening for malignant neoplasm of cervix: Secondary | ICD-10-CM | POA: Diagnosis present

## 2024-03-10 DIAGNOSIS — Z113 Encounter for screening for infections with a predominantly sexual mode of transmission: Secondary | ICD-10-CM

## 2024-03-10 DIAGNOSIS — Z1379 Encounter for other screening for genetic and chromosomal anomalies: Secondary | ICD-10-CM

## 2024-03-10 DIAGNOSIS — Z1159 Encounter for screening for other viral diseases: Secondary | ICD-10-CM

## 2024-03-10 DIAGNOSIS — Z3A11 11 weeks gestation of pregnancy: Secondary | ICD-10-CM

## 2024-03-10 DIAGNOSIS — Z114 Encounter for screening for human immunodeficiency virus [HIV]: Secondary | ICD-10-CM

## 2024-03-10 DIAGNOSIS — Z131 Encounter for screening for diabetes mellitus: Secondary | ICD-10-CM

## 2024-03-10 NOTE — Progress Notes (Signed)
 OBSTETRIC INITIAL PRENATAL VISIT  Subjective:    Gina Lester is being seen today for her first obstetrical visit.  This is not a planned pregnancy. She is a 28 y.o. [id number] female at [redacted]w[redacted]d gestation, Estimated Date of Delivery: 09/29/24 with Patient's last menstrual period was 12/17/2023,  not consistent with 8 week sono. Will change EDD from 09/23/2023 to 09/30/2023 She has no significant obstetrical history. Relationship with FOB: significant other, living together. Patient does intend to breast feed. Pregnancy history fully reviewed.    OB History  Gravida Para Term Preterm AB Living  5 3 3  0 1 3  SAB IAB Ectopic Multiple Live Births  1 0 0 0 3    # Outcome Date GA Lbr Len/2nd Weight Sex Type Anes PTL Lv  5 Current           4 Term 11/02/22 [redacted]w[redacted]d / 00:16 6 lb 14.1 oz (3.12 kg) M Vag-Spont None  LIV     Name: SUMAIYA, ARRUDA Kiele     Apgar1: 8  Apgar5: 9  3 Term 04/08/18 [redacted]w[redacted]d / 00:02 7 lb 8.6 oz (3.42 kg) M Vag-Spont None  LIV     Name: SHAMYRA, FARIAS Alyia     Apgar1: 8  Apgar5: 9  2 SAB 2018          1 Term 12/11/15 [redacted]w[redacted]d / 00:33 7 lb 7.2 oz (3.38 kg) M Vag-Spont None  LIV     Name: BETTYLOU, FREW Cyndee     Apgar1: 8  Apgar5: 9    Gynecologic History:  Last pap smear was 05/08/2022.  Results were abnormal.  Reports h/o abnormal pap smears in the past.  Reports history of STIs.  Contraception prior to conception: None   Past Medical History:  Diagnosis Date   Constipation    LGSIL on Pap smear of cervix 02/2022   Marijuana use during pregnancy 10/19/2022   Oligohydramnios 11/01/2022   Seasonal allergies    STD (sexually transmitted disease) complicating pregnancy, antepartum 04/08/2022   Positive for gonorrhea and chlamydia.    Family History  Problem Relation Age of Onset   Gallbladder disease Mother    Breast cancer Neg Hx    Ovarian cancer Neg Hx    Colon cancer Neg Hx     Past Surgical History:  Procedure Laterality Date    CHOLECYSTECTOMY N/A 05/17/2018   Procedure: LAPAROSCOPIC CHOLECYSTECTOMY;  Surgeon: Desiderio Schanz, MD;  Location: [facility] ORS;  Service: General;  Laterality: N/A;   ENDOSCOPIC RETROGRADE CHOLANGIOPANCREATOGRAPHY (ERCP) WITH PROPOFOL  N/A 05/16/2018   Procedure: ENDOSCOPIC RETROGRADE CHOLANGIOPANCREATOGRAPHY (ERCP) WITH PROPOFOL ;  Surgeon: Jinny Carmine, MD;  Location: [facility] ENDOSCOPY;  Service: Endoscopy;  Laterality: N/A;    Social History   Socioeconomic History   Marital status: Single    Spouse name: Not on file   Number of children: 3   Years of education: 16   Highest education level: GED or equivalent  Occupational History   Occupation: retail  Tobacco Use   Smoking status: Never   Smokeless tobacco: Never  Vaping Use   Vaping status: Never Used  Substance and Sexual Activity   Alcohol use: Not Currently   Drug use: No   Sexual activity: Yes    Partners: Male    Birth control/protection: None  Other Topics Concern   Not on file  Social History Narrative   Patient is in the 11th grade and lives with mother and father in [place].   Social Drivers of Health  Financial Resource Strain: Low Risk  (02/08/2024)   Overall Financial Resource Strain (CARDIA)    Difficulty of Paying Living Expenses: Not hard at all  Food Insecurity: No Food Insecurity (02/08/2024)   Hunger Vital Sign    Worried About Running Out of Food in the Last Year: Never true    Ran Out of Food in the Last Year: Never true  Transportation Needs: No Transportation Needs (02/08/2024)   PRAPARE - Administrator, Civil Service (Medical): No    Lack of Transportation (Non-Medical): No  Physical Activity: Inactive (02/08/2024)   Exercise Vital Sign    Days of Exercise per Week: 0 days    Minutes of Exercise per Session: Not on file  Stress: No Stress Concern Present (02/08/2024)   [employer] of Occupational Health - Occupational Stress Questionnaire    Feeling of Stress: Not at all  Social  Connections: Socially Isolated (02/08/2024)   Social Connection and Isolation Panel    Frequency of Communication with Friends and Family: Never    Frequency of Social Gatherings with Friends and Family: Never    Attends Religious Services: More than 4 times per year    Active Member of [employer] or Organizations: No    Attends Engineer, structural: Not on file    Marital Status: Never married  Intimate Partner Violence: Not At Risk (02/10/2024)   Humiliation, Afraid, Rape, and Kick questionnaire    Fear of Current or Ex-Partner: No    Emotionally Abused: No    Physically Abused: No    Sexually Abused: No    Current Outpatient Medications on File Prior to Visit  Medication Sig Dispense Refill   Doxylamine -Pyridoxine  10-10 MG TBEC 1 tablet at bedtime and if vomiting continues may take 1 tablet twice daily 60 tablet 0   metoCLOPramide  (REGLAN ) 5 MG tablet Take 1 tablet (5 mg total) by mouth 2 (two) times daily. 30 tablet 0   Prenatal Vit-Fe Fumarate-FA (MULTIVITAMIN-PRENATAL) 27-0.8 MG TABS tablet Take 1 tablet by mouth daily at 12 noon.     No current facility-administered medications on file prior to visit.    No Known Allergies   Review of Systems General: Not Present- Fever, Weight Loss and Weight Gain. Skin: Not Present- Rash. HEENT: Not Present- Blurred Vision, Headache and Bleeding Gums. Respiratory: Not Present- Difficulty Breathing. Breast: Not Present- Breast Mass. Cardiovascular: Not Present- Chest Pain, Elevated Blood Pressure, Fainting / Blacking Out and Shortness of Breath. Gastrointestinal: Not Present- Abdominal Pain, Constipation, Nausea and Vomiting. Female Genitourinary: Not Present- Frequency, Painful Urination, Pelvic Pain, Vaginal Bleeding, Vaginal Discharge, Contractions, regular, Fetal Movements Decreased, Urinary Complaints and Vaginal Fluid. Musculoskeletal: Not Present- Back Pain and Leg Cramps. Neurological: Not Present- Dizziness. Psychiatric: Not  Present- Depression.     Objective:   Blood pressure 108/71, pulse (!) 59, weight 118 lb 12.8 oz (53.9 kg), last menstrual period 12/17/2023, not currently breastfeeding.  Body mass index is 23.2 kg/m.  General Appearance:    Alert, cooperative, no distress, appears stated age  Head:    Normocephalic, without obvious abnormality, atraumatic  Eyes:    PERRL, conjunctiva/corneas clear, EOM's intact, both eyes  Ears:    Normal external ear canals, both ears  Nose:   Nares normal, septum midline, mucosa normal, no drainage or sinus tenderness  Throat:   Lips, mucosa, and tongue normal; teeth and gums normal  Neck:   Supple, symmetrical, trachea midline, no adenopathy; thyroid: no enlargement/tenderness/nodules; no carotid bruit or JVD  Back:     Symmetric, no curvature, ROM normal, no CVA tenderness  Lungs:     Clear to auscultation bilaterally, respirations unlabored  Chest Wall:    No tenderness or deformity   Heart:    Regular rate and rhythm, S1 and S2 normal, no murmur, rub or gallop  Breast Exam:    No tenderness, masses, or nipple abnormality  Abdomen:     Soft, non-tender, bowel sounds active all four quadrants, no masses, no organomegaly.    Genitalia:    Pelvic:external genitalia normal, vagina without lesions, discharge, or tenderness, rectovaginal septum  normal. Cervix normal in appearance, no cervical motion tenderness, no adnexal masses or tenderness.  Pregnancy positive findings: uterine enlargement: 11 wk size, nontender.   Rectal:    Normal external sphincter.  No hemorrhoids appreciated. Internal exam not done.   Extremities:   Extremities normal, atraumatic, no cyanosis or edema  Pulses:   2+ and symmetric all extremities  Skin:   Skin color, texture, turgor normal, no rashes or lesions  Lymph nodes:   Cervical, supraclavicular, and axillary nodes normal  Neurologic:   CNII-XII intact, normal strength, sensation and reflexes throughout     Assessment:   1. Encounter  for supervision of other normal pregnancy in first trimester   2. Need for hepatitis C screening test   3. Encounter for screening for HIV   4. Screen for STD (sexually transmitted disease)   5. Screening for diabetes mellitus (DM)   6. [redacted] weeks gestation of pregnancy   7. Genetic screening   8. Cervical cancer screening     Plan:   Supervision of normal pregnancy  - Initial labs reviewed. - Prenatal vitamins encouraged. - Problem list reviewed and updated. - New OB counseling:  The patient has been given an overview regarding routine prenatal care.  Recommendations regarding diet, weight gain, and exercise in pregnancy were given. - Prenatal testing, optional genetic testing, and ultrasound use in pregnancy were reviewed.  Traditional genetic screening vs cell-fee DNA genetic screening discussed, including risks and benefits. Testing requested. - Benefits of Breast Feeding were discussed. The patient is encouraged to consider nursing her baby post partum.   2. Screen for STD (sexually transmitted disease) -collected. Positive for chlamydia in the past year.    Follow up in 4 weeks.    Damien Parsley, CNM [facility]

## 2024-03-11 LAB — CBC/D/PLT+RPR+RH+ABO+RUBIGG...
Antibody Screen: NEGATIVE
Basophils Absolute: 0 x10E3/uL (ref 0.0–0.2)
Basos: 0 %
EOS (ABSOLUTE): 0.2 x10E3/uL (ref 0.0–0.4)
Eos: 2 %
HCV Ab: NONREACTIVE
HIV Screen 4th Generation wRfx: NONREACTIVE
Hematocrit: 42.1 % (ref 34.0–46.6)
Hemoglobin: 13.8 g/dL (ref 11.1–15.9)
Hepatitis B Surface Ag: NEGATIVE
Immature Grans (Abs): 0 x10E3/uL (ref 0.0–0.1)
Immature Granulocytes: 0 %
Lymphocytes Absolute: 2.4 x10E3/uL (ref 0.7–3.1)
Lymphs: 33 %
MCH: 28.6 pg (ref 26.6–33.0)
MCHC: 32.8 g/dL (ref 31.5–35.7)
MCV: 87 fL (ref 79–97)
Monocytes Absolute: 0.5 x10E3/uL (ref 0.1–0.9)
Monocytes: 7 %
Neutrophils Absolute: 4.2 x10E3/uL (ref 1.4–7.0)
Neutrophils: 58 %
Platelets: 256 x10E3/uL (ref 150–450)
RBC: 4.83 x10E6/uL (ref 3.77–5.28)
RDW: 12.8 % (ref 11.7–15.4)
RPR Ser Ql: NONREACTIVE
Rh Factor: POSITIVE
Rubella Antibodies, IGG: 1.58 {index} (ref >0.99)
Varicella zoster IgG: NONREACTIVE
WBC: 7.2 x10E3/uL (ref 3.4–10.8)

## 2024-03-11 LAB — HCV INTERPRETATION

## 2024-03-11 LAB — CERVICOVAGINAL ANCILLARY ONLY
Chlamydia: NEGATIVE
Comment: NEGATIVE
Comment: NORMAL
Neisseria Gonorrhea: NEGATIVE

## 2024-03-11 LAB — HEMOGLOBIN A1C
Est. average glucose Bld gHb Est-mCnc: 94 mg/dL
Hgb A1c MFr Bld: 4.9 % (ref 4.8–5.6)

## 2024-03-13 ENCOUNTER — Encounter: Payer: Self-pay | Admitting: Certified Nurse Midwife

## 2024-03-13 LAB — CYTOLOGY - PAP: Diagnosis: NEGATIVE

## 2024-03-14 LAB — MATERNIT 21 PLUS CORE, BLOOD
Fetal Fraction: 23
Result (T21): NEGATIVE
Trisomy 13 (Patau syndrome): NEGATIVE
Trisomy 18 (Edwards syndrome): NEGATIVE
Trisomy 21 (Down syndrome): NEGATIVE

## 2024-03-20 ENCOUNTER — Other Ambulatory Visit: Payer: Self-pay

## 2024-03-20 ENCOUNTER — Emergency Department

## 2024-03-20 ENCOUNTER — Emergency Department
Admission: EM | Admit: 2024-03-20 | Discharge: 2024-03-20 | Disposition: A | Discharge status: Home / Self Care | Attending: Emergency Medicine | Admitting: Emergency Medicine

## 2024-03-20 DIAGNOSIS — Z3A12 12 weeks gestation of pregnancy: Secondary | ICD-10-CM | POA: Insufficient documentation

## 2024-03-20 DIAGNOSIS — O418X10 Other specified disorders of amniotic fluid and membranes, first trimester, not applicable or unspecified: Secondary | ICD-10-CM

## 2024-03-20 DIAGNOSIS — O200 Threatened abortion: Secondary | ICD-10-CM | POA: Diagnosis not present

## 2024-03-20 DIAGNOSIS — O209 Hemorrhage in early pregnancy, unspecified: Secondary | ICD-10-CM | POA: Diagnosis present

## 2024-03-20 DIAGNOSIS — O4690 Antepartum hemorrhage, unspecified, unspecified trimester: Secondary | ICD-10-CM

## 2024-03-20 DIAGNOSIS — Z9189 Other specified personal risk factors, not elsewhere classified: Secondary | ICD-10-CM

## 2024-03-20 LAB — BASIC METABOLIC PANEL WITH GFR
Anion gap: 7 (ref 5–15)
BUN: 8 mg/dL (ref 6–20)
CO2: 22 mmol/L (ref 22–32)
Calcium: 8.6 mg/dL — ABNORMAL LOW (ref 8.9–10.3)
Chloride: 109 mmol/L (ref 98–111)
Creatinine, Ser: 0.37 mg/dL — ABNORMAL LOW (ref 0.44–1.00)
GFR, Estimated: >60 mL/min (ref >60)
Glucose, Bld: 97 mg/dL (ref 70–99)
Potassium: 3.5 mmol/L (ref 3.5–5.1)
Sodium: 138 mmol/L (ref 135–145)

## 2024-03-20 LAB — CBC WITH DIFFERENTIAL/PLATELET
Abs Immature Granulocytes: 0.02 K/uL (ref 0.00–0.07)
Basophils Absolute: 0 K/uL (ref 0.0–0.1)
Basophils Relative: 0 %
Eosinophils Absolute: 0.3 K/uL (ref 0.0–0.5)
Eosinophils Relative: 3 %
HCT: 38 % (ref 36.0–46.0)
Hemoglobin: 13.4 g/dL (ref 12.0–15.0)
Immature Granulocytes: 0 %
Lymphocytes Relative: 42 %
Lymphs Abs: 3.3 K/uL (ref 0.7–4.0)
MCH: 29 pg (ref 26.0–34.0)
MCHC: 35.3 g/dL (ref 30.0–36.0)
MCV: 82.3 fL (ref 80.0–100.0)
Monocytes Absolute: 0.5 K/uL (ref 0.1–1.0)
Monocytes Relative: 7 %
Neutro Abs: 3.7 K/uL (ref 1.7–7.7)
Neutrophils Relative %: 48 %
Platelets: 219 K/uL (ref 150–400)
RBC: 4.62 MIL/uL (ref 3.87–5.11)
RDW: 13 % (ref 11.5–15.5)
WBC: 7.8 K/uL (ref 4.0–10.5)
nRBC: 0 % (ref 0.0–0.2)

## 2024-03-20 LAB — HCG, QUANTITATIVE, PREGNANCY: hCG, Beta Chain, Quant, S: >250000 m[IU]/mL — ABNORMAL HIGH (ref <5)

## 2024-03-20 NOTE — ED Triage Notes (Signed)
 Pt reports she is aprox [redacted] weeks pregnant and woke up this morning with vaginal bleeding. Pt denies abd cramping. G5P3. Pt is seen at [facility]

## 2024-03-20 NOTE — ED Provider Notes (Signed)
 US  OB Comp Less 14 Wks Result Date: 03/20/2024 EXAM: OBSTETRIC ULTRASOUND FIRST TRIMESTER TECHNIQUE: Transabdominal and transvaginal first trimester obstetric pelvic duplex ultrasound was performed with real-time imaging, color flow Doppler imaging, and spectral analysis. COMPARISON: 02/24/2024 CLINICAL HISTORY: Vaginal bleeding FINDINGS: UTERUS: No focal myometrial mass. There is small to moderate subchorionic hemorrhage involving the left-sided uterus. Equivocal second area of subchorionic hemorrhage is noted within the anterior mid uterus. GESTATIONAL SAC(S): Single normal appearing intrauterine gestational sac. YOLK SAC: Present EMBRYO(<11WK) /FETUS(>=11WK): Single embryo with cardiac activity. The fetal heart rate is 150 bpm. CROWN RUMP LENGTH: 60 mm which corresponds to a 12-week 3-day gestation. RATE OF CARDIAC ACTIVITY: 150 bpm RIGHT OVARY: Unremarkable. LEFT OVARY: Unremarkable. FREE FLUID: No free fluid. MEASUREMENTS ESTIMATED GESTATIONAL AGE BY CURRENT ULTRASOUND: 12 weeks 3 days ESTIMATED GESTATIONAL AGE BY LMP/PRIOR ULTRASOUND: 13 weeks 3 days ESTIMATED DUE DATE: 09/29/2024 IMPRESSION: 1. Single intrauterine gestational sac with yolk sac, embryo, and cardiac activity. Fetal heart rate is 150 bpm. Crown-rump length is 60 mm, corresponding to a 12-week 3-day gestation. EDC by ultrasound is 09/29/24. 2. Small to moderate subchorionic hemorrhage involving the left-side of uterus. Equivocal second area of subchorionic hemorrhage within the anterior mid uterus. Electronically signed by: Waddell Calk MD 03/20/2024 07:13 AM EDT RP Workstation: [id number]     Ultrasound with positive intrauterine pregnancy.  Noted subchorionic hemorrhage.   ----------------------------------------- 8:43 AM on 03/20/2024 ----------------------------------------- Patient resting without distress.  Reviewed ultrasound results, notified her that reassuring examination of the baby/fetus.  We did discuss concerns that  subchorionic hemorrhage was causing the bleeding and educated patient on this.  She will be taking careful and cautious return and bleeding precautions which I discussed with her.  She is agreeable.  Return precautions and treatment recommendations and follow-up discussed with the patient who is agreeable with the plan.    Dicky Anes, MD 03/20/24 [phone]

## 2024-03-20 NOTE — ED Provider Notes (Signed)
 [facility] Provider Note    Event Date/Time   First MD Initiated Contact with Patient 03/20/24 [phone]     (approximate)   History   Vaginal Bleeding   HPI  Gina Lester is a 28 y.o. female   Past medical history of approximately [redacted] weeks pregnant who woke up this morning with vaginal bleeding.  No pain.  Followed by [facility] with a confirmed IUP during this pregnancy ultrasound obtained earlier this month.  No significant pain.  No vaginal trauma sustained.  No blood thinner use.  No vaginal discharge or urinary symptoms.    External Medical Documents Reviewed: Ultrasound from 02/24/2024 confirming singleton intrauterine pregnancy      Physical Exam   Triage Vital Signs: ED Triage Vitals  Encounter Vitals Group     BP 03/20/24 0537 113/75     Girls Systolic BP Percentile --      Girls Diastolic BP Percentile --      Boys Systolic BP Percentile --      Boys Diastolic BP Percentile --      Pulse Rate 03/20/24 0537 78     Resp 03/20/24 0537 16     Temp 03/20/24 0537 98.4 F (36.9 C)     Temp src --      SpO2 03/20/24 0537 97 %     Weight 03/20/24 0536 118 lb (53.5 kg)     Height 03/20/24 0536 5' (1.524 m)     Head Circumference --      Peak Flow --      Pain Score 03/20/24 0536 0     Pain Loc --      Pain Education --      Exclude from Growth Chart --     Most recent vital signs: Vitals:   03/20/24 0537 03/20/24 0547  BP: 113/75   Pulse: 78   Resp: 16   Temp: 98.4 F (36.9 C)   SpO2: 97% 98%    General: Awake, no distress.  CV:  Good peripheral perfusion.  Resp:  Normal effort.  Abd:  No distention.  Other:  Hemodynamics within normal limits, comfortable appearing patient in no acute distress with a nonfocal nonperitoneal abdominal exam.  External exam shows no active hemorrhaging.  Patient deferring internal exam.   ED Results / Procedures / Treatments   Labs (all labs ordered are listed, but only  abnormal results are displayed) Labs Reviewed  BASIC METABOLIC PANEL WITH GFR - Abnormal; Notable for the following components:      Result Value   Creatinine, Ser 0.37 (*)    Calcium  8.6 (*)    All other components within normal limits  HCG, QUANTITATIVE, PREGNANCY - Abnormal; Notable for the following components:   hCG, Beta Chain, Quant, S >250,000 (*)    All other components within normal limits  CBC WITH DIFFERENTIAL/PLATELET  URINALYSIS, ROUTINE W REFLEX MICROSCOPIC     I ordered and reviewed the above labs they are notable for normal H&H compared to prior     RADIOLOGY I independently reviewed and interpreted pelvic ultrasound and see an intrauterine pregnancy I also reviewed radiologist's formal read.   PROCEDURES:  Critical Care performed: No  Procedures   MEDICATIONS ORDERED IN ED: Medications - No data to display  IMPRESSION / MDM / ASSESSMENT AND PLAN / ED COURSE  I reviewed the triage vital signs and the nursing notes.  Patient's presentation is most consistent with acute presentation with potential threat to life or bodily function.  Differential diagnosis includes, but is not limited to, threatened miscarriage, acute blood loss anemia, trauma,   The patient is on the cardiac monitor to evaluate for evidence of arrhythmia and/or significant heart rate changes.  MDM:    Threatened miscarriage with vaginal bleeding 12-week pregnant patient. History of blood testing done with Rh+ defer RhoGAM.  Obtain hCG, ultrasound, and anticipate discharge with very close obstetrics/gynecology follow-up for threatened miscarriage.        FINAL CLINICAL IMPRESSION(S) / ED DIAGNOSES   Final diagnoses:  Vaginal bleeding in pregnancy  Threatened miscarriage     Rx / DC Orders   ED Discharge Orders     None        Note:  This document was prepared using Dragon voice recognition software and may include unintentional  dictation errors.    Cyrena Mylar, MD 03/20/24 [phone]

## 2024-03-20 NOTE — ED Provider Notes (Signed)
 Obtained in sign out. About [redacted] weeks pregnant. Concern today for threatened miscarriage.   TVUS is pending.    Dicky Anes, MD 03/20/24 [phone]

## 2024-03-20 NOTE — Discharge Instructions (Addendum)
 Please call your obstetrics/gynecology provider for a follow-up appointment this morning  Thank you for choosing us  for your health care today!  Please see your primary doctor this week for a follow up appointment.   If you have any new, worsening, or unexpected symptoms call your doctor right away or come back to the emergency department for reevaluation.  It was my pleasure to care for you today.   Ginnie EDISON Cyrena, MD

## 2024-03-24 ENCOUNTER — Other Ambulatory Visit

## 2024-03-25 ENCOUNTER — Ambulatory Visit: Payer: Self-pay | Admitting: Certified Nurse Midwife

## 2024-03-25 LAB — BETA HCG QUANT (REF LAB): hCG Quant: 633793 m[IU]/mL

## 2024-03-26 ENCOUNTER — Other Ambulatory Visit

## 2024-03-26 ENCOUNTER — Telehealth: Payer: Self-pay | Admitting: Certified Nurse Midwife

## 2024-03-26 NOTE — Telephone Encounter (Signed)
 Reached out to pt to reschedule lab appt (beta) that was scheduled on 03/26/2024 at 2:40.  Pt stated that her car was not working.  Was able to reschedule the appt to 03/27/2024 at 2:20.

## 2024-03-27 ENCOUNTER — Other Ambulatory Visit

## 2024-04-03 ENCOUNTER — Other Ambulatory Visit: Payer: Self-pay

## 2024-04-03 ENCOUNTER — Emergency Department

## 2024-04-03 ENCOUNTER — Emergency Department
Admission: EM | Admit: 2024-04-03 | Discharge: 2024-04-03 | Disposition: A | Discharge status: Home / Self Care | Attending: Emergency Medicine | Admitting: Emergency Medicine

## 2024-04-03 DIAGNOSIS — O418X10 Other specified disorders of amniotic fluid and membranes, first trimester, not applicable or unspecified: Secondary | ICD-10-CM

## 2024-04-03 DIAGNOSIS — O418X20 Other specified disorders of amniotic fluid and membranes, second trimester, not applicable or unspecified: Secondary | ICD-10-CM | POA: Diagnosis not present

## 2024-04-03 DIAGNOSIS — Z3A14 14 weeks gestation of pregnancy: Secondary | ICD-10-CM | POA: Insufficient documentation

## 2024-04-03 DIAGNOSIS — O209 Hemorrhage in early pregnancy, unspecified: Secondary | ICD-10-CM | POA: Diagnosis present

## 2024-04-03 DIAGNOSIS — O4690 Antepartum hemorrhage, unspecified, unspecified trimester: Secondary | ICD-10-CM

## 2024-04-03 LAB — CBC WITH DIFFERENTIAL/PLATELET
Abs Immature Granulocytes: 0.04 K/uL (ref 0.00–0.07)
Basophils Absolute: 0 K/uL (ref 0.0–0.1)
Basophils Relative: 0 %
Eosinophils Absolute: 0.1 K/uL (ref 0.0–0.5)
Eosinophils Relative: 2 %
HCT: 37.4 % (ref 36.0–46.0)
Hemoglobin: 12.5 g/dL (ref 12.0–15.0)
Immature Granulocytes: 1 %
Lymphocytes Relative: 35 %
Lymphs Abs: 2.8 K/uL (ref 0.7–4.0)
MCH: 27.7 pg (ref 26.0–34.0)
MCHC: 33.4 g/dL (ref 30.0–36.0)
MCV: 82.7 fL (ref 80.0–100.0)
Monocytes Absolute: 0.6 K/uL (ref 0.1–1.0)
Monocytes Relative: 8 %
Neutro Abs: 4.3 K/uL (ref 1.7–7.7)
Neutrophils Relative %: 54 %
Platelets: 224 K/uL (ref 150–400)
RBC: 4.52 MIL/uL (ref 3.87–5.11)
RDW: 12.9 % (ref 11.5–15.5)
WBC: 7.8 K/uL (ref 4.0–10.5)
nRBC: 0 % (ref 0.0–0.2)

## 2024-04-03 LAB — URINALYSIS, ROUTINE W REFLEX MICROSCOPIC
Bacteria, UA: NONE SEEN
Bilirubin Urine: NEGATIVE
Glucose, UA: NEGATIVE mg/dL
Ketones, ur: 80 mg/dL — AB
Leukocytes,Ua: NEGATIVE
Nitrite: NEGATIVE
Protein, ur: 100 mg/dL — AB
RBC / HPF: >50 RBC/hpf (ref 0–5)
Specific Gravity, Urine: 1.029 (ref 1.005–1.030)
pH: 6 (ref 5.0–8.0)

## 2024-04-03 LAB — ABO/RH: ABO/RH(D): O POS

## 2024-04-03 LAB — HCG, QUANTITATIVE, PREGNANCY: hCG, Beta Chain, Quant, S: >250000 m[IU]/mL — ABNORMAL HIGH (ref <5)

## 2024-04-03 MED ORDER — ACETAMINOPHEN 500 MG PO TABS
1000.0000 mg | ORAL_TABLET | Freq: Once | ORAL | Status: AC
  Administered 2024-04-03: 1000 mg via ORAL
  Filled 2024-04-03 (×2): qty 2

## 2024-04-03 NOTE — ED Notes (Addendum)
 MD Claudene chaperoned by this RN during pts pelvic exam

## 2024-04-03 NOTE — ED Notes (Signed)
Pt states still unable to give urine sample.

## 2024-04-03 NOTE — Telephone Encounter (Signed)
 Patient was contacted going to ER for reassurance

## 2024-04-03 NOTE — ED Triage Notes (Signed)
 Pt to ed from home via POV for vag bleeding. Pt is [redacted] weeks pregnant and was just seen for same. Pt thinks she just passed a big clot and thinks it was her baby. Pt is caox4, crying in triage.

## 2024-04-03 NOTE — ED Notes (Signed)
 Pink tube with save label sent down

## 2024-04-03 NOTE — ED Notes (Signed)
 This RN assisted pt in changing her pad. Pt has large clot in pad and discarded and cleaned up and new pad placed before placing her in lobby.

## 2024-04-03 NOTE — ED Provider Notes (Signed)
 [facility] Provider Note    Event Date/Time   First MD Initiated Contact with Patient 04/03/24 1614     (approximate)   History   Vaginal Bleeding   HPI  Gina Lester is a 28 y.o. female who presents to the ED for evaluation of Vaginal Bleeding   I reviewed ED visit from 8/29.  Seen for first trimester vaginal bleeding, followed by [facility], G5 P3-0-1-3 LMP 5/27.  Ultrasound in the ED with IUP and small/moderate subchorionic hemorrhage.  Patient reports since that visit she has had slow dark brown spotting without significant bleeding until today she developed emesis this morning that has since subsided, bright red bleeding and passage of a large clot just prior to arrival.  No cramping until she arrives here, she reports she is having some pelvic and lower back cramping over the past few minutes that have developed.  She changed a pad just prior to arrival and is not sure if she is continuing to bleed  Nursing staff able to get fetal heart tones on arrival to her room, rate in the 150s No fevers or urinary changes  Physical Exam   Triage Vital Signs: ED Triage Vitals  Encounter Vitals Group     BP 04/03/24 1552 (!) 124/93     Girls Systolic BP Percentile --      Girls Diastolic BP Percentile --      Boys Systolic BP Percentile --      Boys Diastolic BP Percentile --      Pulse Rate 04/03/24 1552 (!) 110     Resp 04/03/24 1552 18     Temp 04/03/24 1552 98.7 F (37.1 C)     Temp Source 04/03/24 1552 Oral     SpO2 04/03/24 1552 98 %     Weight --      Height --      Head Circumference --      Peak Flow --      Pain Score 04/03/24 1553 0     Pain Loc --      Pain Education --      Exclude from Growth Chart --     Most recent vital signs: Vitals:   04/03/24 1745 04/03/24 2037  BP: 119/79 119/73  Pulse: 87 66  Resp: 16 16  Temp:  98.4 F (36.9 C)  SpO2: 100% 98%    General: Awake, no distress.  CV:  Good peripheral  perfusion.  Resp:  Normal effort.  Abd:  No distention.  Mild diffuse lower abdominal tenderness. MSK:  No deformity noted.  Neuro:  No focal deficits appreciated. Other:     ED Results / Procedures / Treatments   Labs (all labs ordered are listed, but only abnormal results are displayed) Labs Reviewed  URINALYSIS, ROUTINE W REFLEX MICROSCOPIC - Abnormal; Notable for the following components:      Result Value   Color, Urine AMBER (*)    APPearance CLEAR (*)    Hgb urine dipstick LARGE (*)    Ketones, ur 80 (*)    Protein, ur 100 (*)    All other components within normal limits  HCG, QUANTITATIVE, PREGNANCY - Abnormal; Notable for the following components:   hCG, Beta Chain, Quant, S >250,000 (*)    All other components within normal limits  CBC WITH DIFFERENTIAL/PLATELET  ABO/RH    EKG   RADIOLOGY Obstetric ultrasound interpreted by me with IUP, small subchorionic hemorrhage  Official radiology report(s): US   OB Limited Result Date: 04/03/2024 EXAM: ULTRASOUND OB LIMITED CLINICAL HISTORY: Vaginal bleeding; Subchorionic hematoma; Pregnant; Pelvic cramping. TECHNIQUE: Transabdominal obstetric pelvic ultrasound was performed with color Doppler flow evaluation. COMPARISON: None provided. FINDINGS: FETUS: A single live intrauterine pregnancy is present, estimated gestational age [redacted] weeks and 2 days. POSITION: The fetus position is variable. HEART RATE: Fetal heart rate measures 162 beats per minute. PLACENTA: The placenta is located posterior. A marginal previa is present. AMNIOTIC FLUID: The amniotic fluid volume is normal. BIOMETRICS: Estimated fetal weight is not provided. BPD measures 2.47 cm. A small subchorionic hemorrhage is noted posteriorly. IMPRESSION: 1. Single live intrauterine pregnancy with gestational age of [redacted] weeks and 2 days by current sonographic biometry. The estimated due date is 09/29/2024. 2. Small posterior subchorionic hemorrhage. 3. Marginal previa.  Electronically signed by: Lonni Necessary MD 04/03/2024 06:16 PM EDT RP Workstation: [id number]    PROCEDURES and INTERVENTIONS:  Procedures  Medications  acetaminophen  (TYLENOL ) tablet 1,000 mg (1,000 mg Oral Given 04/03/24 1701)     IMPRESSION / MDM / ASSESSMENT AND PLAN / ED COURSE  I reviewed the triage vital signs and the nursing notes.  Differential diagnosis includes, but is not limited to, subchorionic hemorrhage, miscarriage, PID, acute appendicitis, endometritis  {Patient presents with symptoms of an acute illness or injury that is potentially life-threatening.  Patient with recently diagnosed subchorionic hematoma presents with acutely worsening bleeding.  Rh+ without indication for RhoGAM and hemoglobin remains normal.  Ultrasound with IUP and small subchronic hemorrhage, decreased in size from comparison a couple weeks ago.  She is continued bleeding so I performed a pelvic exam and she has a clot within a closed cervix that I removed with a Q-tip and her bleeding subsides.  Suitable for outpatient management with close return precautions and OB follow-up  Clinical Course as of 04/03/24 2120  Fri Apr 03, 2024  1617 FHT 150s [DS]  2009 Chaperoned pelvic exam with nursing staff, blood clot at close cervix that I removed with a large Q-tip.  Seems to slow the bleeding [DS]  2059 Reassessed, no further bleeding since this pelvic exam she reports feeling better, we discussed outpatient management and ED return precautions.  She is agreeable. [DS]    Clinical Course User Index [DS] Claudene Rover, MD     FINAL CLINICAL IMPRESSION(S) / ED DIAGNOSES   Final diagnoses:  Vaginal bleeding in pregnancy  Subchorionic hematoma in first trimester, single or unspecified fetus     Rx / DC Orders   ED Discharge Orders     None        Note:  This document was prepared using Dragon voice recognition software and may include unintentional dictation errors.   Claudene Rover,  MD 04/03/24 2120

## 2024-04-03 NOTE — ED Notes (Signed)
 Pt unable to urinate at this moment for the urine sample and stated she will try after drinking water

## 2024-04-03 NOTE — ED Notes (Signed)
Pt in ultrasound at this moment

## 2024-04-06 NOTE — Progress Notes (Unsigned)
    Return Prenatal Note   Subjective   28 y.o. [id number] at [redacted]w[redacted]d presents for this follow-up prenatal visit.  Patient is doing well. She has no new concerns today. She continue to have some bleeding. She will have AFP done today. Patient reports: Movement: Absent Contractions: Not present  Objective   Flow sheet Vitals: Pulse Rate: 84 BP: 112/64 Fetal Heart Rate (bpm): 158 Total weight gain: -17 lb 1.6 oz (-7.757 kg)  General Appearance  No acute distress, well appearing, and well nourished Pulmonary   Normal work of breathing Neurologic   Alert and oriented to person, place, and time Psychiatric   Mood and affect within normal limits   Assessment/Plan   Plan  28 y.o. [id number] at [redacted]w[redacted]d presents for follow-up OB visit. Reviewed prenatal record including previous visit note.  Subchorionic hematoma in second trimester -Seen in ED for bleeding on 9/12. Small SCH noted on US . She continues to have vaginal bleeding today but it has gotten lighter since her ED visit.    Marginal placenta previa Marginal previa also seen in ED. Reviewed pelvic rest because of the vaginal bleeding. Reviewed need for assessment in the ED if she experiences another episode of heavy bleeding. Will plan for anatomy US  on the earlier side. FHTs stable today.  Supervision of other normal pregnancy, antepartum Reviewed red flag warning signs anticipatory guidance for upcoming prenatal care.        Orders Placed This Encounter  Procedures   US  OB Comp + 14 Wk    Standing Status:   Future    Expected Date:   05/07/2024    Expiration Date:   04/07/2025    Reason for Exam (SYMPTOM  OR DIAGNOSIS REQUIRED):   anatomy    Preferred Imaging Location?:   Internal   AFP, Serum, Open Spina Bifida    Is patient insulin dependent?:   No    Patient weight (lb.):   117.9    Gestational Age (GA), weeks:   15    Date on which patient was at this GA:   04/06/2024    GA Calculation Method:   Ultrasound    GA Date:    09/29/2024    Number of fetuses:   1    Reason for screen:   OTHER             screening    Donor egg?:   N   CBC   No follow-ups on file.   Future Appointments  Date Time Provider Department Center  05/07/2024  2:00 PM [facility] US  1 [facility]G None  05/07/2024  3:15 PM Justino Eleanor HERO, CNM [facility] None    For next visit:  continue with routine prenatal care     Damien Parsley, CNM [facility] 09/19/255:22 PM

## 2024-04-07 ENCOUNTER — Ambulatory Visit: Admitting: Certified Nurse Midwife

## 2024-04-07 ENCOUNTER — Encounter: Payer: Self-pay | Admitting: Certified Nurse Midwife

## 2024-04-07 VITALS — BP 112/64 | HR 84 | Wt 117.9 lb

## 2024-04-07 DIAGNOSIS — O468X2 Other antepartum hemorrhage, second trimester: Secondary | ICD-10-CM

## 2024-04-07 DIAGNOSIS — O418X20 Other specified disorders of amniotic fluid and membranes, second trimester, not applicable or unspecified: Secondary | ICD-10-CM | POA: Diagnosis not present

## 2024-04-07 DIAGNOSIS — Z3A15 15 weeks gestation of pregnancy: Secondary | ICD-10-CM

## 2024-04-07 DIAGNOSIS — Z1379 Encounter for other screening for genetic and chromosomal anomalies: Secondary | ICD-10-CM

## 2024-04-07 DIAGNOSIS — Z3689 Encounter for other specified antenatal screening: Secondary | ICD-10-CM

## 2024-04-07 DIAGNOSIS — O4420 Partial placenta previa NOS or without hemorrhage, unspecified trimester: Secondary | ICD-10-CM | POA: Insufficient documentation

## 2024-04-07 DIAGNOSIS — O4422 Partial placenta previa NOS or without hemorrhage, second trimester: Secondary | ICD-10-CM

## 2024-04-07 DIAGNOSIS — Z3481 Encounter for supervision of other normal pregnancy, first trimester: Secondary | ICD-10-CM

## 2024-04-07 DIAGNOSIS — Z3480 Encounter for supervision of other normal pregnancy, unspecified trimester: Secondary | ICD-10-CM

## 2024-04-07 NOTE — Assessment & Plan Note (Signed)
 Marginal previa also seen in ED. Reviewed pelvic rest because of the vaginal bleeding. Reviewed need for assessment in the ED if she experiences another episode of heavy bleeding. Will plan for anatomy US  on the earlier side. FHTs stable today.

## 2024-04-07 NOTE — Patient Instructions (Signed)
 Alpha-Fetoprotein Test: What to Know Why am I having this test? The alpha-fetoprotein (AFP) test is a blood test used to show if a pregnant person is at risk of carrying a baby with a congenital condition. A congenital condition is a problem that a baby is born with. The AFP test can be combined with other tests to look for these problems in the unborn baby: Genetic problems. Problems with the brain or spinal cord. Belly problems. This test is used to see if your baby is at risk for having a congenital condition. It doesn't show if your baby actually has the condition. More testing will be needed to know for sure. The AFP test may also be done for males or nonpregnant people to check for certain cancers. What is being tested? This test measures the amount of AFP in your blood. AFP is a protein that's normally found in your blood starting in the 10th week of pregnancy. The level of AFP is highest at 16-18 weeks of pregnancy. AFP testing can be done between 15 and 20 weeks of pregnancy, but 16 to 18 weeks of pregnancy is the best time to do it. Certain cancers can cause a high level of AFP in both males and females. What kind of sample is taken?  A blood sample is needed for this test. It's usually taken by putting a needle into a blood vessel. How are the results reported? Your results will be compared with normal results for this test. Each lab has its own range for what's normal. Most lab reports include the normal ranges for each test and a note telling you if yours is high or low. Normal results for the AFP test tend to be: Adult: Less than 40 ng/mL or less than 40 mcg/L (SI units). Child younger than 1 year: Less than 30 ng/mL. If you're pregnant, the values will change based on how far along you are. What do the results mean? If you're pregnant and your results are lower than expected, it can mean that your due date isn't right or that your baby may have trisomy 57, also known as Down  syndrome. If you're pregnant and your results are higher than expected, it may mean: You're pregnant with more than one baby. Your due date is not right. Your baby may have: A problem with the wall of the belly. A problem with the spinal cord or brain. Your baby isn't getting enough oxygen, or the heart rate is too fast or too slow. Your baby has died before being born. Results that are higher than expected in males or nonpregnant females may indicate: Cancer. Liver cell death. Talk with your health care provider about what your results mean. AFP testing is not used alone. Other testing will be needed. Questions to ask your health care provider Ask your provider, or the department that's doing the test: When will my results be ready? How will I get my results? What other tests do I need? What are my next steps? This information is not intended to replace advice given to you by your health care provider. Make sure you discuss any questions you have with your health care provider. Document Revised: 05/22/2023 Document Reviewed: 05/22/2023 Elsevier Patient Education  2025 [employer].

## 2024-04-07 NOTE — Assessment & Plan Note (Signed)
Reviewed red flag warning signs anticipatory guidance for upcoming prenatal care.

## 2024-04-07 NOTE — Assessment & Plan Note (Addendum)
-  Seen in ED for bleeding on 9/12. Small SCH noted on US . She continues to have vaginal bleeding today but it has gotten lighter since her ED visit.

## 2024-04-09 ENCOUNTER — Other Ambulatory Visit: Payer: Self-pay

## 2024-04-09 ENCOUNTER — Encounter: Payer: Self-pay | Admitting: *Deleted

## 2024-04-09 ENCOUNTER — Other Ambulatory Visit: Payer: Self-pay | Admitting: Licensed Practical Nurse

## 2024-04-09 ENCOUNTER — Observation Stay
Admission: EM | Admit: 2024-04-09 | Discharge: 2024-04-10 | Disposition: A | Discharge status: Home / Self Care | Attending: Licensed Practical Nurse | Admitting: Licensed Practical Nurse

## 2024-04-09 ENCOUNTER — Emergency Department

## 2024-04-09 DIAGNOSIS — Z3A16 16 weeks gestation of pregnancy: Secondary | ICD-10-CM | POA: Diagnosis not present

## 2024-04-09 DIAGNOSIS — Z3A15 15 weeks gestation of pregnancy: Secondary | ICD-10-CM

## 2024-04-09 DIAGNOSIS — O4690 Antepartum hemorrhage, unspecified, unspecified trimester: Secondary | ICD-10-CM | POA: Diagnosis present

## 2024-04-09 DIAGNOSIS — R1030 Lower abdominal pain, unspecified: Secondary | ICD-10-CM | POA: Insufficient documentation

## 2024-04-09 DIAGNOSIS — O280 Abnormal hematological finding on antenatal screening of mother: Secondary | ICD-10-CM

## 2024-04-09 DIAGNOSIS — Z3402 Encounter for supervision of normal first pregnancy, second trimester: Secondary | ICD-10-CM

## 2024-04-09 DIAGNOSIS — O26892 Other specified pregnancy related conditions, second trimester: Principal | ICD-10-CM | POA: Insufficient documentation

## 2024-04-09 LAB — AFP, SERUM, OPEN SPINA BIFIDA
AFP MoM: 17.35
AFP Value: 603.9 ng/mL
Gest. Age on Collection Date: 15.1 wk
Maternal Age At EDD: 26.5 a
OSBR Risk 1 IN: 10
Test Results:: POSITIVE — AB
Weight: 117 [lb_av]

## 2024-04-09 LAB — COMPREHENSIVE METABOLIC PANEL WITH GFR
ALT: 54 U/L — ABNORMAL HIGH (ref 0–44)
AST: 33 U/L (ref 15–41)
Albumin: 3 g/dL — ABNORMAL LOW (ref 3.5–5.0)
Alkaline Phosphatase: 86 U/L (ref 38–126)
Anion gap: 12 (ref 5–15)
BUN: 9 mg/dL (ref 6–20)
CO2: 19 mmol/L — ABNORMAL LOW (ref 22–32)
Calcium: 8.5 mg/dL — ABNORMAL LOW (ref 8.9–10.3)
Chloride: 104 mmol/L (ref 98–111)
Creatinine, Ser: 0.38 mg/dL — ABNORMAL LOW (ref 0.44–1.00)
GFR, Estimated: >60 mL/min (ref >60)
Glucose, Bld: 79 mg/dL (ref 70–99)
Potassium: 3.7 mmol/L (ref 3.5–5.1)
Sodium: 135 mmol/L (ref 135–145)
Total Bilirubin: 0.8 mg/dL (ref 0.0–1.2)
Total Protein: 7.3 g/dL (ref 6.5–8.1)

## 2024-04-09 LAB — CBC
HCT: 36.2 % (ref 36.0–46.0)
Hematocrit: 34.2 % (ref 34.0–46.6)
Hemoglobin: 11.3 g/dL (ref 11.1–15.9)
Hemoglobin: 12.7 g/dL (ref 12.0–15.0)
MCH: 28.7 pg (ref 26.6–33.0)
MCH: 28.4 pg (ref 26.0–34.0)
MCHC: 33 g/dL (ref 31.5–35.7)
MCHC: 35.1 g/dL (ref 30.0–36.0)
MCV: 87 fL (ref 79–97)
MCV: 81 fL (ref 80.0–100.0)
Platelets: 223 x10E3/uL (ref 150–450)
Platelets: 261 K/uL (ref 150–400)
RBC: 3.94 x10E6/uL (ref 3.77–5.28)
RBC: 4.47 MIL/uL (ref 3.87–5.11)
RDW: 12.7 % (ref 11.7–15.4)
RDW: 12.6 % (ref 11.5–15.5)
WBC: 6.6 x10E3/uL (ref 3.4–10.8)
WBC: 11.9 K/uL — ABNORMAL HIGH (ref 4.0–10.5)
nRBC: 0 % (ref 0.0–0.2)

## 2024-04-09 LAB — HCG, QUANTITATIVE, PREGNANCY: hCG, Beta Chain, Quant, S: >250000 m[IU]/mL — ABNORMAL HIGH (ref <5)

## 2024-04-09 MED ORDER — ACETAMINOPHEN 325 MG PO TABS
650.0000 mg | ORAL_TABLET | Freq: Once | ORAL | Status: DC
Start: 2024-04-09 — End: 2024-04-09
  Filled 2024-04-09: qty 2

## 2024-04-09 MED ORDER — SODIUM CHLORIDE 0.9 % IV BOLUS (SEPSIS)
1000.0000 mL | Freq: Once | INTRAVENOUS | Status: AC
  Administered 2024-04-09: 1000 mL via INTRAVENOUS

## 2024-04-09 MED ORDER — ONDANSETRON HCL 4 MG/2ML IJ SOLN
4.0000 mg | Freq: Once | INTRAMUSCULAR | Status: AC
  Administered 2024-04-09: 4 mg via INTRAVENOUS
  Filled 2024-04-09: qty 2

## 2024-04-09 MED ORDER — ACETAMINOPHEN 10 MG/ML IV SOLN
1000.0000 mg | Freq: Once | INTRAVENOUS | Status: AC
  Administered 2024-04-10: 1000 mg via INTRAVENOUS
  Filled 2024-04-09: qty 100

## 2024-04-09 NOTE — Discharge Instructions (Signed)
 Please follow up with your OB/GYN.

## 2024-04-09 NOTE — ED Provider Notes (Signed)
 11:25 PM  Assumed care at shift change.  28 year old female who is 16 weeks, 2 days pregnant with subchorionic hemorrhage who presents with abdominal pain.  Has had vaginal bleeding throughout pregnancy that is slowly improving.  OB ultrasound, urinalysis pending.  12:40 AM  Pt's OB ultrasound reviewed and interpreted by myself and the radiologist and shows increasing size of her subchorionic hemorrhage that now covers the cervical os.  She reports her bleeding is minimal.  No leaking fluid.  On my reexamination, patient is tender throughout the pelvic region but more so in the right lower quadrant.  Recommended obtaining MRI to rule out appendicitis.  She states that she has had intermittent bleeding for weeks but has not had pain.  She denies any urinary symptoms.  No vaginal discharge.  Urine does show pyuria but no bacteria.  Culture pending.  2:10 AM  Spoke with [place] with [place] radiology to get a prelim read on MRI.  He will reach out to the radiologist.  Patient did not have any pain relief with IV fluids, Tylenol .  We discussed risk and benefits of morphine  and she agreed to proceed.  3:00 AM Spoke with Dr. Evelyne with radiology.  He reports MRI shows normal-appearing appendix however he states placenta appears abnormal and is heterogeneous in nature with multiple cysts.  He states that he has seen similar appearing placentas with molar pregnancies.  I did reach out to Jinnie Cookey, certified nurse midwife on-call for [facility] given these findings.  She has spoken with Dr. Lovetta, attending on-call and they will admit patient to their service for observation and MFM consult.  Patient's pain well-controlled after 1 dose of morphine .  3:30 AM Patient now having increasing upper abdominal pain.  Has had prior cholecystectomy.  Now tearful, moaning in pain.  Will reach out to certified nurse midwife to see patient down in the emergency department given no bed available yet upstairs.   Will give additional IV fluids, pain and nausea medicine and IV Pepcid .   Dannah Ryles, Josette SAILOR, DO 04/10/24 [id number]

## 2024-04-09 NOTE — ED Triage Notes (Signed)
 Pt to triage via wheelchair.  Pt has abd cramping and vaginal bleeding .  Pt is appox [redacted] weeks pregnant.   Denies urinary sx.   [id number]   pt alert

## 2024-04-09 NOTE — ED Provider Notes (Signed)
 [facility] Provider Note    Event Date/Time   First MD Initiated Contact with Patient 04/09/24 2107     (approximate)   History   Vaginal Bleeding and Abdominal Pain   HPI  Gina Lester is a 28 y.o. female [phone] presents for evaluation of abdominal cramping and vaginal bleeding. Patient reports she has had bleeding throughout this pregnancy due to subchorionic hematoma and marginal placenta previa. Patient states the bleeding is less than her usual. She developed some cramping this morning and was advised to come the ED for evaluation. Patient reports she was informed her AFP was abnormal today as well.       Physical Exam   Triage Vital Signs: ED Triage Vitals  Encounter Vitals Group     BP 04/09/24 2009 130/72     Girls Systolic BP Percentile --      Girls Diastolic BP Percentile --      Boys Systolic BP Percentile --      Boys Diastolic BP Percentile --      Pulse Rate 04/09/24 2009 96     Resp 04/09/24 2009 18     Temp 04/09/24 2009 98.3 F (36.8 C)     Temp Source 04/09/24 2009 Oral     SpO2 04/09/24 2009 96 %     Weight 04/09/24 2010 117 lb (53.1 kg)     Height 04/09/24 2010 5' (1.524 m)     Head Circumference --      Peak Flow --      Pain Score 04/09/24 2009 8     Pain Loc --      Pain Education --      Exclude from Growth Chart --     Most recent vital signs: Vitals:   04/09/24 2009  BP: 130/72  Pulse: 96  Resp: 18  Temp: 98.3 F (36.8 C)  SpO2: 96%   General: Awake, no distress.  CV:  Good peripheral perfusion. RRR. Resp:  Normal effort. CTAB. Abd:  No distention. Soft, gravid belly, TTP throughout abdomen. Other:     ED Results / Procedures / Treatments   Labs (all labs ordered are listed, but only abnormal results are displayed) Labs Reviewed  COMPREHENSIVE METABOLIC PANEL WITH GFR - Abnormal; Notable for the following components:      Result Value   CO2 19 (*)    Creatinine, Ser 0.38 (*)     Calcium  8.5 (*)    Albumin 3.0 (*)    ALT 54 (*)    All other components within normal limits  CBC - Abnormal; Notable for the following components:   WBC 11.9 (*)    All other components within normal limits  HCG, QUANTITATIVE, PREGNANCY - Abnormal; Notable for the following components:   hCG, Beta Chain, Quant, S >250,000 (*)    All other components within normal limits  URINALYSIS, ROUTINE W REFLEX MICROSCOPIC    RADIOLOGY  Pelvic ultrasound pending.   PROCEDURES:  Critical Care performed: No  Procedures   MEDICATIONS ORDERED IN ED: Medications  sodium chloride  0.9 % bolus 1,000 mL (has no administration in time range)  ondansetron  (ZOFRAN ) injection 4 mg (has no administration in time range)  acetaminophen  (OFIRMEV ) IV 1,000 mg (has no administration in time range)     IMPRESSION / MDM / ASSESSMENT AND PLAN / ED COURSE  I reviewed the triage vital signs and the nursing notes.  28 year old female who is about [redacted] weeks pregnant presents for evaluation of pelvic pain and vaginal bleeding. Patient NAD on exam.  Differential diagnosis includes, but is not limited to, round ligament pain, miscarriage, subchorionic hemorrhage, appendicitis, gastroenteritis.   Patient's presentation is most consistent with acute complicated illness / injury requiring diagnostic workup.  CBC shows mildly elevated WBCs at 11.9. CMP unremarkable. Beta HCG is >250,000.  Will obtain pelvic ultrasound, lower suspicion for abdominal pathology like appendicitis given reassuring labs.   Will give patient tylenol  for pain.  Clinical Course as of 04/09/24 2334  Thu Apr 09, 2024  2330 Care of the patient passed off to the night doctor pending OB ultrasound.  [LD]  2333 Patient reporting increased pain and vomiting. States pain is worse form the ultrasound tech pushing on her abdomen. Will add zofran . Patient updated on plan. [LD]    Clinical Course User Index [LD]  Cleaster Tinnie LABOR, PA-C     FINAL CLINICAL IMPRESSION(S) / ED DIAGNOSES   Final diagnoses:  Abdominal pain during pregnancy in second trimester     Rx / DC Orders   ED Discharge Orders     None        Note:  This document was prepared using Dragon voice recognition software and may include unintentional dictation errors.   Cleaster Tinnie LABOR, PA-C 04/09/24 2334    Willo Dunnings, MD 04/12/24 [id number]

## 2024-04-09 NOTE — Progress Notes (Signed)
 TC to pt, Your AFP is abnormal, the recommendation is to have a targeted US .  Pt reports she has had cramping for most of the day, they cramping is now worse and moves towards her back, she has bene having vaginal bleeding, she has a knows [facility] and placenta previa.  Instructed to go to ED for worsening pain.  MFM  consult placed Jinnie Cookey, [place]  [facility] 04/09/24  6:20 PM

## 2024-04-10 ENCOUNTER — Observation Stay

## 2024-04-10 ENCOUNTER — Emergency Department

## 2024-04-10 DIAGNOSIS — O020 Blighted ovum and nonhydatidiform mole: Secondary | ICD-10-CM | POA: Diagnosis not present

## 2024-04-10 DIAGNOSIS — O4690 Antepartum hemorrhage, unspecified, unspecified trimester: Secondary | ICD-10-CM | POA: Diagnosis present

## 2024-04-10 DIAGNOSIS — Z3A16 16 weeks gestation of pregnancy: Secondary | ICD-10-CM | POA: Diagnosis not present

## 2024-04-10 LAB — URINALYSIS, ROUTINE W REFLEX MICROSCOPIC
Bacteria, UA: NONE SEEN
Bilirubin Urine: NEGATIVE
Glucose, UA: NEGATIVE mg/dL
Ketones, ur: 20 mg/dL — AB
Leukocytes,Ua: NEGATIVE
Nitrite: NEGATIVE
Protein, ur: 30 mg/dL — AB
Specific Gravity, Urine: 1.031 — ABNORMAL HIGH (ref 1.005–1.030)
pH: 5 (ref 5.0–8.0)

## 2024-04-10 MED ORDER — MORPHINE SULFATE (PF) 4 MG/ML IV SOLN
4.0000 mg | Freq: Once | INTRAVENOUS | Status: AC
  Administered 2024-04-10: 4 mg via INTRAVENOUS
  Filled 2024-04-10: qty 1

## 2024-04-10 MED ORDER — ACETAMINOPHEN 10 MG/ML IV SOLN
1000.0000 mg | Freq: Four times a day (QID) | INTRAVENOUS | Status: DC | PRN
  Administered 2024-04-10: 1000 mg via INTRAVENOUS
  Filled 2024-04-10: qty 100

## 2024-04-10 MED ORDER — ONDANSETRON HCL 4 MG/2ML IJ SOLN
4.0000 mg | Freq: Once | INTRAMUSCULAR | Status: AC
  Administered 2024-04-10: 4 mg via INTRAVENOUS
  Filled 2024-04-10: qty 2

## 2024-04-10 MED ORDER — SIMETHICONE 80 MG PO CHEW
80.0000 mg | CHEWABLE_TABLET | Freq: Four times a day (QID) | ORAL | Status: DC | PRN
  Filled 2024-04-10: qty 1

## 2024-04-10 MED ORDER — SODIUM CHLORIDE 0.9 % IV BOLUS (SEPSIS)
1000.0000 mL | Freq: Once | INTRAVENOUS | Status: AC
  Administered 2024-04-10: 1000 mL via INTRAVENOUS

## 2024-04-10 MED ORDER — ACETAMINOPHEN 325 MG PO TABS
650.0000 mg | ORAL_TABLET | ORAL | Status: DC | PRN
  Administered 2024-04-10: 650 mg via ORAL
  Filled 2024-04-10: qty 2

## 2024-04-10 MED ORDER — FAMOTIDINE IN NACL 20-0.9 MG/50ML-% IV SOLN
20.0000 mg | Freq: Once | INTRAVENOUS | Status: AC
  Administered 2024-04-10: 20 mg via INTRAVENOUS
  Filled 2024-04-10: qty 50

## 2024-04-10 MED ORDER — CALCIUM CARBONATE ANTACID 500 MG PO CHEW: 2.0000 | CHEWABLE_TABLET | ORAL | Status: DC | PRN

## 2024-04-10 MED ORDER — PRENATAL MULTIVITAMIN CH
1.0000 | ORAL_TABLET | Freq: Every day | ORAL | Status: DC
  Filled 2024-04-10: qty 1

## 2024-04-10 MED ORDER — HYDROCODONE-ACETAMINOPHEN 5-325 MG PO TABS: 1.0000 | ORAL_TABLET | ORAL | Status: DC | PRN

## 2024-04-10 MED ORDER — LACTATED RINGERS IV SOLN
125.0000 mL/h | INTRAVENOUS | Status: AC
  Administered 2024-04-10: 125 mL/h via INTRAVENOUS

## 2024-04-10 NOTE — Unmapped External Note (Signed)
 Called to patient room after she had passed increasing blood clots.  Presented to bedside with family-planning fellow, Dr. Raguel. Patient noted to be very uncomfortable with increased bleeding between the legs. Vital signs notable for tachycardia to 140. Decision made at that time to elevate OR status to class A. Patient transported to the operating room on remote monitoring with the assistance of primary RN and OB anesthesia resident. OR staff made aware. Patient crossed for 2 units of RBC.  Maurilio Brooklyn, MD OB Attending 10:20 PM 04/10/24

## 2024-04-10 NOTE — OB Triage Note (Signed)
 Patient reports to LD after being evaluated in the ED for several hours for severe abdominal pain. Patient reports pain is gone and that she just needs sleep.  Midwife aware of patient arrival on unit, will continue to monitor for resurgence of pain and to consult with MFM.

## 2024-04-10 NOTE — H&P (Addendum)
 Gina Lester is a 28 y.o. female presenting to the ED for lower abdominal pain and bleeding. The bleeding has persisted for a few weeks but the lower abdominal cramping has worsened throughout today. The cramping has resolved with tylenol  and morphine . Upon exam the patient reports epigastric sharp abdominal pain that comes and goes, it resolved with morphine  for about 30 to 40 min but has since returned. She had a BM today, and reports voiding well. She has not eaten today, and endorses nausea and vomiting. Her bleeding remains stable from what she has had since the diagnosis of her [facility].  MRI was done to rule out appendicitis, appendix found to be normal. However, placenta was found to have several cysts and heterogeneous in nature. 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 Given abdominal pain and abnormal MRI will admit patient.   There was a subchorionic hemorrhage noted on US  on 8/29 that was small to moderate. Upon follow up ultrasound in September a small [facility] was noted. On ultrasound 9/18, a large SCH was noted to extend along the posterior cervical os. Pericardial effusion of the fetus was noted on ultrasound.   Of note pt reports over the last few weeks she has had episodes of palpitations with chest tightening and lightheadedness, this happens almost daily. These episodes tend to be works when she does not get enough sleep. She does have a history of anxiety, these sensations feel similar to when she experienced anxiety, but she has not been anxious. She has reported these symptoms and was told to have her chest evaluated. She denies these symptoms now.   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   Past Surgical History:  Procedure Laterality Date   CHOLECYSTECTOMY N/A 05/17/2018   Procedure: LAPAROSCOPIC CHOLECYSTECTOMY;  Surgeon: Desiderio Schanz, MD;  Location: [facility] ORS;  Service: General;  Laterality: N/A;   ENDOSCOPIC RETROGRADE CHOLANGIOPANCREATOGRAPHY (ERCP) WITH PROPOFOL  N/A 05/16/2018   Procedure: ENDOSCOPIC RETROGRADE CHOLANGIOPANCREATOGRAPHY (ERCP) WITH PROPOFOL ;  Surgeon: Jinny Carmine, MD;  Location: [facility] ENDOSCOPY;  Service: Endoscopy;  Laterality: N/A;   Family History: family history includes Gallbladder disease in her mother. Social History:  reports that she has never smoked. She has never used smokeless tobacco. She reports that she does not currently use alcohol. She reports that she does not use drugs.     Maternal Diabetes: No Genetic Screening: Abnormal:  Results: Other: Elevated AFP Maternal Ultrasounds/Referrals: Other: Subchorionic hemorrhage, previa Fetal Ultrasounds or other Referrals:  Referred to Materal Fetal Medicine  Maternal Substance Abuse:  No Significant Maternal Medications:  None Significant Maternal Lab Results:  None Number of Prenatal Visits:greater than 3 verified prenatal visits Maternal Vaccinations: none Other Comments:  See MRI.  Review of Systems  Gastrointestinal:  Positive for abdominal pain.   History   Blood pressure 119/73,  pulse 75, temperature 98 F (36.7 C), temperature source Oral, resp. rate 18, height 5' (1.524 m), weight 53.1 kg, last menstrual period 12/17/2023, SpO2 99%, not currently breastfeeding. Maternal Exam:  Abdomen: Patient reports the following abdominal tenderness: Epigastric.  Introitus: Vagina is positive for vaginal discharge.    Physical Exam Cardiovascular:     Rate and Rhythm: Normal rate and regular rhythm.  Pulmonary:     Effort:  Pulmonary effort is normal.     Breath sounds: Normal breath sounds.  Abdominal:     General: Bowel sounds are normal.     Tenderness: There is abdominal tenderness in the epigastric area.     Comments: Uterine fundus above umbilicus  Genitourinary:    Vagina: Vaginal discharge present.     Uterus: Enlarged.      Comments: Small amount of red discharge on pad. Neurological:     Mental Status: She is alert and oriented to person, place, and time.    Pt observed to have significant epigastric pain in which she became diaphoretic and needed to curl into a fetal position for about for about 1-2 minutes then able to recline back into bed and report feeling better. Epigastric area is tender to touch during these painful episodes, otherwise abdomen is soft and non tender.   Prenatal labs: ABO, Rh: --/--/O POS Performed at [facility], [address] [place], [place] [address]  [phone]) Antibody: Negative (08/19 1446) Rubella: 1.58 (08/19 1446) RPR: Non Reactive (08/19 1446)  HBsAg: Negative (08/19 1446)  HIV: Non Reactive (08/19 1446)  GBS:     Assessment/Plan: H4E6986 at [redacted]w[redacted]d admitted for abdominal pain in early pregnancy.  - Admit to Labor and delivery for observation, transfer to Mother Baby once pain is resolved.  -Norco for pain ordered, simethicone  for gas.  - Consult to MFM.  Dr Mimi aware of admission and plan   JINNIE HERO [facility] 04/10/2024, 4:45 AM

## 2024-04-10 NOTE — Unmapped External Note (Signed)
 OPERATIVE NOTE FOR STANDARD DILATION AND EVACUATION  Patient: Gina Lester Medical Record Number: [id number] Date of Surgery: 04/10/24 Surgeon: Nolon Seat, MD, MPH (Attending Physician) Assistant(s): Juliene JONETTA Roers, MD, (Family Planning Fellow), Sweet Lorrayne Mends MD (Resident)  Procedure: Dilation and Evacuation under ultrasound guidance  Indications: Gina Lester is a 28 y.o. [phone] at 16 wks who presents for vaginal bleeding. Today she was found to have a molar pregnancy with hydropic fetus and abnormal placenta with a highly elevated HCG. She presented to OSH for vaginal bleeding and was transferred to [facility]. On evaluation she was found to be at significant risk to maternal health due to excessive bleeding and fever and was taken to the OR urgently.  Indications for her procedure include: maternal health condition  She is deemed to be at significant risk to maternal health, thus meeting exception for 72h consent.  Findings: 1.  Cervix found to be 2cm dilated with fetus passing spontaneously 2. Gritty texture of uterus and thin endometrial stripe on ultrasound at completion of procedure 3. Products of conception consistent with gestational age seen on gross examination of specimen with fetal parts c/w 16 weeks, all fetal parts identified - 4 extremities, torso, calvarium, genetic testing sent: fetal tissue, hydropic fetus 4. Bilateral thyroid goiter noted on physical exam in OR  Anesthesia: General with intubation and paracervical block with 20 mL of 1% lidocaine  with 8U vasopressin  IVF: 1500mL crystalloid, 500cc albumin UOP: 40mL via straight catherization prior to procedure EBL:  Details:  The patient was correctly identified and the informed consent process had been previously undertaken. She was taken to the operating room where she was placed under anesthesia without complication. She was placed in the dorsal lithotomy position. She was fspontaneously  passing the fetus at this time. After passing the pregnancy, she was noted to have brisk bleeding per vagina. The cervix was found to be dilated to 2 cm. The patient was prepped and draped in the normal sterile fashion. In and out catheterization of the bladder was performed. A speculum was placed in the vagina. A single-toothed tenaculum was applied to the anterior lip of the cervix. TXA and pitocin  were given for bleeding prophylaxis.  The entire procedure was done under ultrasound guidance. A 14mm rigid suction curette was used to evacuate placenta in a standard fashion using multiple passes. An 8mm cannula was used until there was a gritty texture and a thin endometrial stripe was noted on ultrasound. A paracervical block was then placed with 20ml of 1% lidocaine  with 4U vasopressin. The products of conception were examined and found to be consistent with a 26-week fetus. The calvarium, thorax, and all four extremities were identified. Placenta was identified and deemed appropriate for the gestational age and full of vesicles/small cysts.   Continued oozing was noted from the cervical os, so cytotec  PR was placed and bimanual massage was performed. The tenaculum was removed from the cervix. Hemostasis was noted, and the speculum was removed from the vagina. Sponge, lap, and needle counts were correct x 2.  The patient tolerated the procedure and was taken to the recovery room in stable condition.   Dr. Seat was scrubbed and present for the entire procedure.   Complications: none   Condition: stable to PACU  Specimens: Products of conception: fetal parts c/w 16 weeks, all fetal parts identified - 4 extremities, torso, calvarium, genetic testing sent: fetal tissue  Disposition: > cytogenetic testing from the following specimens:fetal tissue, she received  the memory card with footprints; placenta also being sent to path for molar evaluation > Patient will follow up with: [facility] MFM for  results of cytogenetic testing > Contraception plan: none   Juliene JONETTA Roers, MD Date: 04/10/2024 Time: 11:29 PM    I was present for the entire procedure.  Harlene FORBES Seat, MD

## 2024-04-10 NOTE — Telephone Encounter (Signed)
 I received a call from Emily Slaughterbeck (as the [facility] attending today) regarding possible outpatient followup for a partial molar pregnancy @ 16 weeks. Given she is having vaginal bleeding, I recommended she either be assessed locally or transferred to [facility] (via the transfer center). She could also present to the ED. However, given bleeding, pain, potential molar pregnancy, and gestational age I noted I did not think she should wait to be seen in the outpatient setting next week.  Zelda Gaskins, MD

## 2024-04-10 NOTE — Consult Note (Signed)
 MFM Consult Note  Gina Lester is a 28 year old gravida 5 para 3 currently at 16 weeks and 3 days. I was asked by Dr. Leigh to provide consultation regarding the management of her pregnancy. I connected with the patient via telephone today verifying her name and date of birth.  The patient presented to the hospital early this morning complaining of vaginal bleeding and abdominal pain.  She has been experiencing vaginal bleeding for the past few weeks.    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    I reviewed the ultrasound images that were obtained when she was admitted to the hospital.  The posterior placenta appears to have multiple large and small cysts located within the placenta. The placenta also appeared to be enlarged.  A fetus with cardiac activity was noted on that ultrasound.  Although the images of the fetus were limited during that exam, there appeared to be an enlarged heart and possible fluid within the fetal thoracic cavity. An abnormal appearing fetus could not be excluded.  Her beta-hCG levels have been extremely elevated over the past 2 months (greater than 250,000).  This was reported as the highest threshold that could be reported for the beta-hCG test performed by the [facility] laboratory.    She had a beta-hCG level that was performed by the [facility] about 2 weeks ago (when she was about [redacted] weeks pregnant), that was 366,206.  According to the lab, the upper normal limits for the beta-hCG value at 14 weeks is 62,530.  The patient was advised that based on the appearance of her placenta and her extremely elevated beta-hCG levels, there is a high suspicion that she may have a molar pregnancy.  Currently, the patient reports that her abdominal pain has decreased and her vaginal bleeding has decreased.  The implications and management should  she have a molar pregnancy was discussed with the patient.  She understands that treatment for a molar pregnancy would involve evacuation of the uterus.  She understands that the fetus would not survive at this early gestational age.  The increased risk of persistent neoplasia after a molar pregnancy was also discussed.  The increased risk of triploidy should a partial molar pregnancy be present was discussed.    The patient had a cell free DNA test (Materni T21) that showed a low risk for trisomy 21, 18, and 13.  A female fetus was predicted.  The Materni T21 cell free DNA test does not screen for triploidy, unlike the Panorama cell free DNA test.  Due to the possibility of a molar pregnancy, the patient should be referred to MFM at [facility] or [facility] for further evaluation and treatment.  I discussed her case with Emily Slaughterback,CNM and Dr. Leigh.  They will help the patient schedule an appointment with either [facility] or [facility] for further evaluation.  The patient was extremely upset over what I told her today.    She was reassured that should a molar pregnancy be confirmed, following treatment, most women will have a good outcome.    She was also reassured that most women who have a molar pregnancy in one pregnancy, will have a normal pregnancy in their subsequent pregnancies.    The patient stated that all of her questions were answered today.    A total of 70 minutes was spent counseling and coordinating the care for  this patient.

## 2024-04-10 NOTE — OB Triage Note (Signed)
 LABOR & DELIVERY OB TRIAGE DISCHARGE NOTE  SUBJECTIVE  HPI Gina Lester is a 28 y.o. [id number] at [redacted]w[redacted]d who presents to Labor & Delivery for abdominal pain and vaginal bleeding. The bleeding has persisted for a few weeks but the lower abdominal cramping has worsened throughout today. The cramping has improved since early this morning. She continues with mild epigastric pain that improves with tylenol . She continues with scant bleeding.   Overnight an MRI was done to rule out appendicitis, appendix found to be normal. However, placenta was found to have several cysts and heterogeneous in nature. 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 Given abdominal pain and abnormal MRI will admit patient.    There was a subchorionic hemorrhage noted on US  on 8/29 that was small to moderate. Upon follow up ultrasound in September a small [facility] was noted. On ultrasound 9/18, a large SCH was noted to extend along the posterior cervical os. Pericardial effusion of the fetus was noted on ultrasound.   MFM was consulted. See their note.   I called and spoke to Dr. Dominique at [facility] who advice patient present to main ED and there the GYN team can be consulted to provide care.   OB History     Gravida  5   Para  3   Term  3   Preterm      AB  1   Living  3      SAB  1   IAB      Ectopic      Multiple  0   Live Births  3           Scheduled Meds:  prenatal multivitamin  1 tablet Oral Q1200   Continuous Infusions:  acetaminophen  1,000 mg (04/10/24 0945)   PRN Meds:.acetaminophen , acetaminophen , calcium  carbonate, HYDROcodone -acetaminophen , simethicone   OBJECTIVE  BP 119/74 (BP Location: Left Arm)   Pulse 81   Temp 98.4 F (36.9 C) (Oral)   Resp 16   Ht 5' (1.524 m)   Wt 51.3 kg   LMP 12/17/2023 (Exact Date)   SpO2 99%   BMI 22.07 kg/m   ASSESSMENT Impression  1) Pregnancy at [id number], [redacted]w[redacted]d, Estimated Date of Delivery: 09/22/24 2) High suspicion for molar pregnancy per MFM 3)  Abnormal appearing fetus could not be excluded on US   PLAN 1)Discharge from [facility] and proceed to [facility]. Advised going now so that further evaluation and management could happen in a timely manner. Discussed risks of further bleeding. Domenica and partner agreed to go to [facility].    Damien PARSLEY, CNM  04/10/24  5:23 PM

## 2024-04-10 NOTE — OB Triage Note (Signed)

## 2024-04-10 NOTE — H&P (Addendum)
 [facility] Labor & Delivery  History and Physical  ASSESSMENT AND PLAN   Gina Lester is a 28 y.o. [id number] at [redacted]w[redacted]d with EDD: 09/22/2024, by Patient Reported admitted for vaginal bleeding and concern for partial molar pregnancy. 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 Of note, patient also febrile to 100.8 and with fundal tenderness concerning for evolving intrauterine infection.   Discussed the patient and her partner findings concerning for partial molar pregnancy. Reviewed that this is not a viable pregnancy and a and requires close follow-up in the setting of molar pregnancy. Also concerned for ongoing vaginal bleeding, intrauterine infection and risk for maternal mirror syndrome. Recommend dilation and evacuation with surgical pathology and cytogenetics. Family planning team informed.   Based on the status of the patient's medical condition of concern for molar pregnancy, intrauterine infection and ongoing high volume bleeding , it is my medical judgment, to a high degree of medical certainty, that continuation of the pregnancy will so complicate the patient's medical condition that termination of the pregnancy is necessary to avert the patient's death, or a delay would create a serious risk of substantial and irreversible physical impairment of renal, heart, and cognitive function. She is at high risk of mirror syndrome and already has elevated liver enzymes which I suspect are related to evolving maternal preeclampsia, which can evolve in the setting of partial molar pregnancy. Of note, I discussed this recommendation with Dr. Arma, MFM on call, who agrees with these findings and our assessment of the threat to maternal life.   - Family planning team Gina Seat, MD, Gina Lester) en  route, informed consent for dilation and evacuation signed - OR aware and case posted as class B - labs in process; Hgb 11.5, type & screen pending and will plan to cross for 2 u - no contraindications to uterotonics - gent and ampicillin started for concern for IAI; lactate drawn - IVF bolus started   Patient informed and understands that [facility] is a teaching hospital. The care team will include attendings and resident physicians, Advanced practice providers, and learners (ie medical students, nurse practitioner, physician assistant and midwifery students). Learners may also be involved by observing, assisting with vaginal deliveries, and/or surgery. The patient does  consent to learner involvement.     Coding for today's encounter was based on time. I personally spent 55 minutes (moderate level complexity- [id number]) face-to-face and non-face-to-face in the care of this patient, which includes all pre, intra, and post visit time on the date of service.   HPI   CC: vaginal bleeding   Gina Lester is a 28 y.o. [phone] at [redacted]w[redacted]d who presents as a transfer from [facility] for concern for molar pregnancy.   Reports that she has been bleeding on and off for most of the pregnancy and has undergone multiple ultrasounds because of this. She was diagnosed with a subchorionic hematoma earlier in the pregnancy. Of note ultrasound also documents a fetal pericardial effusion at 14 weeks. She does not appear to have undergone cell free DNA screening for aneuploidy. Advised to present to the ED at [facility] in the setting of abdominal pain and vaginal bleeding at the request of her provider. Reports that when she was transferred to [facility] she began to have increasing vaginal bleeding passing clots and feeling a pad; otherwise  unable to quantify. Denies any dizziness or lightheadedness.  Reports 3 prior full-term NSVDs and 1 prior miscarriage. Last pregnancy in 2024.  This is a  spontaneously conceived unplanned but desired pregnancy. Her partner Gina Lester is with her in her room.  Of note, patient noted to be febrile on arrival here, temp 100.8. She also endorses ongoing pain.   Pregnancy Complications Problem List[1]  Review of Systems A twelve point review of systems was negative except as stated in HPI.    No  interpreter was used for this encounter.  HISTORY   Medications Prescriptions Prior to Admission[2]  Allergies has no known allergies.   OB History OB History  Gravida Para Term Preterm AB Living  5 3 3  0 1 3  SAB IAB Ectopic Molar Multiple Live Births  0 0 0 0 0 0    # Outcome Date GA Lbr Len/2nd Weight Sex Type Anes PTL Lv  5 Current           4 Term           3 Term           2 Term           1 AB             Past Medical History Past Medical History[3]  Past Surgical History Past Surgical History[4]  Social History  reports that she has never smoked. She does not have any smokeless tobacco history on file. She reports that she does not currently use alcohol. She reports that she does not currently use drugs.   Family History family history includes Cholelithiasis in her mother.   PHYSICAL EXAM   Vitals:   04/10/24 1810 04/10/24 1831 04/10/24 1900  BP:  124/78   Pulse:   90  Resp:  20   Temp:  (!) 38.2 C (100.8 F)   TempSrc:  Oral   Weight: 52 kg (114 lb 11.2 oz)    Height: 152.4 cm (5')      Constitutional: No acute distress, well appearing, and well nourished. Neurologic: She is alert and conversational.  Psychiatric: She has a normal mood and affect.  Musculoskeletal: Grossly normal range of motion Cardiovascular: Normal rate.   Pulmonary/Chest: Normal work of breathing.  Gastrointestinal/Abdominal: Soft. Gravid. Fundus to U+1 and  Skin: Skin is warm and dry. No rash noted.  Genitourinary: Normal external female genitalia.  SVE:  SVE performed and a chaperone was present  1cm long high SSE: Dark vaginal  bleeding coming from the cervical os, cleared with fox swab, barrelous cervix noted to be visually dilated.     PRENATAL LABS FROM OB RESULTS CONSOLE   Prenatal Results     1st Trimester     Test Value Reference Range Date Time   ABO Rh       Antibody Screen       HCT       HGB       MCV       Platelets       Rubella IGG       RPR       Treponemal Antibody       Urine Culture       HBsAg       HIV       HCV Ab       Chlamydia       Gonorrhea       Trichomonas Screen       HSV  1 IGG       HSV 2 IGG       VZV IGG       TSH       HPV       Pap Smear       Pap Smear ([facility])       Early Glucose       Hemoglobin A1C       GBS - Early Screen       GBS w/ Susceptibility - Early Screen             HELLP Labs     Test Value Reference Range Date Time   HCT  39.6 % 34.0 - 44.0 04/08/22 1300   HGB  13.8 g/dL 88.6 - 85.0 90/82/76 8699   MCV  83.3 fL 77.6 - 95.7 04/08/22 1300   PLT  239 10*9/L 150 - 450 04/08/22 1300   AST  17 U/L <=34 04/08/22 1300   ALT  25 U/L 10 - 49 04/08/22 1300   Creatinine  0.37 mg/dL* 9.39 - 9.19 90/82/76 8699   Creatinine Urine       Protein/Creatinine Ratio        Protein Urine       LDC       Uric Acid             2nd Trimester     Test Value Reference Range Date Time   Antibody Screen       HCT       HGB       Platelets       RPR       Treponemal Antibody       HIV       Glucose, Fasting       O'[place]ullivan Glucose 1 Hour       Glucose, 1 hour tolerance       Glucose, 2 hour tolerance       Glucose, 3 hour tolerance       POC Glucose       POC Glucose, 1 hour       POC Glucose, 2 hour       POC Glucose, 3 hour             3rd Trimester     Test Value Reference Range Date Time   HCT       HGB       Platelets       GBS Sen       GBS       Chlamydia       Gonorrhea       RPR       Treponemal Antibody       HIV       Hemoglobin A1C             Legend   ^: Historical                 ENCOUNTER  LABS   No results found for this visit on 04/10/24.  Maurilio Jeannine Brooklyn, MD 04/10/24  7:22 PM        [1] Patient Active Problem List Diagnosis  . Epigastric abdominal pain  . Constipation  . Adjustment disorder with anxious mood  . Left flank pain, chronic  . Lower extremity weakness  . Molar pregnancy (HHS-HCC)  [2] Medications Prior to Admission  Medication Sig Dispense Refill Last Dose/Taking  . hyoscyamine (ANASPAZ,LEVSIN) 0.125 mg tablet Take  1 tablet (125 mcg total) by mouth every four (4) hours as needed for cramping. 30 tablet 0   [3] No past medical history on file. [4] Past Surgical History: Procedure Laterality Date  . CHOLECYSTECTOMY    *Some images could not be shown.

## 2024-04-11 LAB — URINE CULTURE

## 2024-04-11 NOTE — Care Plan (Signed)
 Transferred in from PACU s/p dilation and evacuation. Assessment and Vs WNL. Ambulated and voided in the bathroom without difficulty. Patient looks comfortable, no untoward complaints. Oriented patient to her room, call bell given and within reach. Plan of care reviewed, will continue to monitor. Problem: Wound Goal: Optimal Coping Outcome: Progressing Goal: Optimal Functional Ability Outcome: Progressing Goal: Absence of Infection Signs and Symptoms Outcome: Progressing Goal: Improved Oral Intake Outcome: Progressing Goal: Optimal Pain Control and Function Outcome: Progressing Goal: Skin Health and Integrity Outcome: Progressing Goal: Optimal Wound Healing Outcome: Progressing   Problem: Infection Goal: Absence of Infection Signs and Symptoms Outcome: Progressing   Problem: Adult Inpatient Plan of Care Goal: Plan of Care Review Outcome: Progressing Goal: Patient-Specific Goal (Individualized) Outcome: Progressing Goal: Absence of Hospital-Acquired Illness or Injury Outcome: Progressing Goal: Optimal Comfort and Wellbeing Outcome: Progressing Goal: Readiness for Transition of Care Outcome: Progressing Goal: Rounds/Family Conference Outcome: Progressing

## 2024-04-11 NOTE — Nursing Note (Signed)
**  delay in entry d/t pt care**   Pt transferred to main OR for stat D&E after increased bleeding and ongoing fever. EBL prior to transfer was approximately ( measured from Triton machine), approx 90ml estimated on gown. Several quarter sized clots noticed, 1 large approx 2in x3in clot also noted. [place]raw[place], MD immediately paged to come to bedside and assess pt as bleeding continued to pick up. LR Bolus started, Stat coags collected, unable to hang antibiotics prior to the OR d/t pharmacy delay. 1g tylenol  + 600mg  of ibuprofen  administered, in addition to 1mg  total of Dilaudid  for pain control.   Cytogenetics collected by Trawick + [place] MDs [employer] performed by this RN and to be given to the family. Placenta + Molar pregnancy + fetus placed into container into L&D fridge for pathology exam.

## 2024-04-15 NOTE — Unmapped External Note (Signed)
 POC logged into morgue on 9/24 @ 1425. [employer] [facility] [employer].

## 2024-04-17 NOTE — Telephone Encounter (Signed)
 Called pt for HCG reminder, call forwarded to voicemail. Voicemail left.

## 2024-04-24 ENCOUNTER — Other Ambulatory Visit: Payer: Self-pay

## 2024-04-24 DIAGNOSIS — O020 Blighted ovum and nonhydatidiform mole: Secondary | ICD-10-CM

## 2024-04-24 NOTE — Progress Notes (Addendum)
 Prenatal patient was referred to [facility] for surgical management of molar pregnancy.  D&C done 9/18.  Per patient, she is supposed to have weekly beta hcg checks for 6 months at [facility], but she does not have transportation to get the [facility].  She would like to check her levels here instead. Order placed for weekly testing x 6 months.  Damien Slaughterbeck CNM copied as the original referring provider to [facility].  Patient was told to call and schedule a lab visit at her convenience.  Last draw was 04/15/24 at [facility] and was 8,211.

## 2024-05-01 ENCOUNTER — Other Ambulatory Visit

## 2024-05-01 DIAGNOSIS — O020 Blighted ovum and nonhydatidiform mole: Secondary | ICD-10-CM

## 2024-05-02 LAB — BETA HCG QUANT (REF LAB): hCG Quant: 91 m[IU]/mL

## 2024-05-04 ENCOUNTER — Encounter: Payer: Self-pay | Admitting: Certified Nurse Midwife

## 2024-05-04 DIAGNOSIS — Z8759 Personal history of other complications of pregnancy, childbirth and the puerperium: Secondary | ICD-10-CM | POA: Insufficient documentation

## 2024-05-05 ENCOUNTER — Encounter: Admitting: Obstetrics

## 2024-05-06 ENCOUNTER — Ambulatory Visit: Payer: Self-pay | Admitting: Certified Nurse Midwife

## 2024-05-07 ENCOUNTER — Encounter: Admitting: Obstetrics

## 2024-05-07 ENCOUNTER — Other Ambulatory Visit

## 2024-05-08 ENCOUNTER — Other Ambulatory Visit

## 2024-05-08 DIAGNOSIS — O020 Blighted ovum and nonhydatidiform mole: Secondary | ICD-10-CM

## 2024-05-09 LAB — BETA HCG QUANT (REF LAB): hCG Quant: 39 m[IU]/mL

## 2024-05-15 ENCOUNTER — Other Ambulatory Visit

## 2024-05-15 DIAGNOSIS — O020 Blighted ovum and nonhydatidiform mole: Secondary | ICD-10-CM

## 2024-05-16 LAB — BETA HCG QUANT (REF LAB): hCG Quant: 20 m[IU]/mL

## 2024-05-22 ENCOUNTER — Other Ambulatory Visit

## 2024-05-22 DIAGNOSIS — O020 Blighted ovum and nonhydatidiform mole: Secondary | ICD-10-CM

## 2024-05-23 LAB — BETA HCG QUANT (REF LAB): hCG Quant: 12 m[IU]/mL

## 2024-05-25 ENCOUNTER — Ambulatory Visit: Payer: Self-pay | Admitting: Certified Nurse Midwife

## 2024-05-25 DIAGNOSIS — Z8759 Personal history of other complications of pregnancy, childbirth and the puerperium: Secondary | ICD-10-CM

## 2024-05-28 NOTE — Telephone Encounter (Signed)
 The patient is scheduled for 11/7 for labs

## 2024-05-29 ENCOUNTER — Telehealth: Payer: Self-pay | Admitting: Certified Nurse Midwife

## 2024-05-29 ENCOUNTER — Other Ambulatory Visit

## 2024-05-29 NOTE — Telephone Encounter (Signed)
 Reached out to pt to reschedule lab appt that was scheduled on 05/29/2024 at 2:40.  Left message for pt to call back to reschedule.

## 2024-06-01 ENCOUNTER — Other Ambulatory Visit

## 2024-06-01 DIAGNOSIS — O020 Blighted ovum and nonhydatidiform mole: Secondary | ICD-10-CM

## 2024-06-01 NOTE — Telephone Encounter (Signed)
 Pt has been rescheduled to 06/01/2024 at 11:00.

## 2024-06-02 ENCOUNTER — Ambulatory Visit: Payer: Self-pay | Admitting: Certified Nurse Midwife

## 2024-06-02 ENCOUNTER — Other Ambulatory Visit: Payer: Self-pay | Admitting: Certified Nurse Midwife

## 2024-06-02 DIAGNOSIS — Z8759 Personal history of other complications of pregnancy, childbirth and the puerperium: Secondary | ICD-10-CM

## 2024-06-02 LAB — BETA HCG QUANT (REF LAB): hCG Quant: 5 m[IU]/mL

## 2024-06-12 ENCOUNTER — Other Ambulatory Visit

## 2024-06-12 DIAGNOSIS — Z8759 Personal history of other complications of pregnancy, childbirth and the puerperium: Secondary | ICD-10-CM

## 2024-06-13 LAB — THYROID PANEL WITH TSH
Free Thyroxine Index: 1.4 (ref 1.2–4.9)
T3 Uptake Ratio: 26 % (ref 24–39)
T4, Total: 5.5 ug/dL (ref 4.5–12.0)
TSH: 0.89 u[IU]/mL (ref 0.450–4.500)

## 2024-06-13 LAB — BETA HCG QUANT (REF LAB): hCG Quant: 3 m[IU]/mL

## 2024-06-22 ENCOUNTER — Other Ambulatory Visit

## 2024-06-22 DIAGNOSIS — Z8759 Personal history of other complications of pregnancy, childbirth and the puerperium: Secondary | ICD-10-CM

## 2024-06-23 ENCOUNTER — Ambulatory Visit: Payer: Self-pay | Admitting: Certified Nurse Midwife

## 2024-06-23 ENCOUNTER — Telehealth: Payer: Self-pay | Admitting: Certified Nurse Midwife

## 2024-06-23 LAB — BETA HCG QUANT (REF LAB): hCG Quant: 2 m[IU]/mL

## 2024-06-23 NOTE — Telephone Encounter (Signed)
 Reached out to pt to let her know that she only needs to come in once a month now for her HCG draws.  She will need to make an appointment in January for her next draw.  Left message for pt to call back.

## 2024-07-03 NOTE — Telephone Encounter (Signed)
 Pt is aware that she only needs labs once a month now.  Communicated with pt via MyChart.

## 2024-07-05 ENCOUNTER — Encounter (HOSPITAL_COMMUNITY): Payer: Self-pay | Admitting: *Deleted

## 2024-07-05 ENCOUNTER — Other Ambulatory Visit: Payer: Self-pay

## 2024-07-05 ENCOUNTER — Emergency Department (HOSPITAL_COMMUNITY): Admission: EM | Admit: 2024-07-05 | Discharge: 2024-07-05 | Disposition: A | Discharge status: Home / Self Care

## 2024-07-05 DIAGNOSIS — R55 Syncope and collapse: Secondary | ICD-10-CM | POA: Insufficient documentation

## 2024-07-05 DIAGNOSIS — R519 Headache, unspecified: Secondary | ICD-10-CM | POA: Insufficient documentation

## 2024-07-05 DIAGNOSIS — R000 Tachycardia, unspecified: Secondary | ICD-10-CM | POA: Insufficient documentation

## 2024-07-05 DIAGNOSIS — R1110 Vomiting, unspecified: Secondary | ICD-10-CM | POA: Insufficient documentation

## 2024-07-05 DIAGNOSIS — R079 Chest pain, unspecified: Secondary | ICD-10-CM | POA: Insufficient documentation

## 2024-07-05 DIAGNOSIS — Z5321 Procedure and treatment not carried out due to patient leaving prior to being seen by health care provider: Secondary | ICD-10-CM | POA: Insufficient documentation

## 2024-07-05 LAB — CBC WITH DIFFERENTIAL/PLATELET
Abs Immature Granulocytes: 0.03 K/uL (ref 0.00–0.07)
Basophils Absolute: 0 K/uL (ref 0.0–0.1)
Basophils Relative: 0 %
Eosinophils Absolute: 0.2 K/uL (ref 0.0–0.5)
Eosinophils Relative: 2 %
HCT: 36.2 % (ref 36.0–46.0)
Hemoglobin: 11.1 g/dL — ABNORMAL LOW (ref 12.0–15.0)
Immature Granulocytes: 0 %
Lymphocytes Relative: 31 %
Lymphs Abs: 2.4 K/uL (ref 0.7–4.0)
MCH: 22.3 pg — ABNORMAL LOW (ref 26.0–34.0)
MCHC: 30.7 g/dL (ref 30.0–36.0)
MCV: 72.7 fL — ABNORMAL LOW (ref 80.0–100.0)
Monocytes Absolute: 0.5 K/uL (ref 0.1–1.0)
Monocytes Relative: 6 %
Neutro Abs: 4.7 K/uL (ref 1.7–7.7)
Neutrophils Relative %: 61 %
Platelets: 296 K/uL (ref 150–400)
RBC: 4.98 MIL/uL (ref 3.87–5.11)
RDW: 15 % (ref 11.5–15.5)
WBC: 7.8 K/uL (ref 4.0–10.5)
nRBC: 0 % (ref 0.0–0.2)

## 2024-07-05 LAB — BASIC METABOLIC PANEL WITH GFR
Anion gap: 13 (ref 5–15)
BUN: 10 mg/dL (ref 6–20)
CO2: 23 mmol/L (ref 22–32)
Calcium: 9.2 mg/dL (ref 8.9–10.3)
Chloride: 103 mmol/L (ref 98–111)
Creatinine, Ser: 0.52 mg/dL (ref 0.44–1.00)
GFR, Estimated: >60 mL/min (ref >60)
Glucose, Bld: 96 mg/dL (ref 70–99)
Potassium: 3.3 mmol/L — ABNORMAL LOW (ref 3.5–5.1)
Sodium: 139 mmol/L (ref 135–145)

## 2024-07-05 LAB — RESP PANEL BY RT-PCR (RSV, FLU A&B, COVID)  RVPGX2
Influenza A by PCR: NEGATIVE
Influenza B by PCR: NEGATIVE
Resp Syncytial Virus by PCR: NEGATIVE
SARS Coronavirus 2 by RT PCR: NEGATIVE

## 2024-07-05 LAB — HCG, SERUM, QUALITATIVE: Preg, Serum: NEGATIVE

## 2024-07-05 NOTE — ED Notes (Signed)
 Patient wheeled out by the female that brought her in. He put her in the car and they left.

## 2024-07-05 NOTE — ED Triage Notes (Signed)
 Patient states she supposedly passed out. Patient states she feels fine other than she's cold and has some chest pain. Denies dizziness and shob.

## 2024-07-05 NOTE — ED Provider Triage Note (Signed)
 Emergency Medicine Provider Triage Evaluation Note  Gina Lester , a 28 y.o. female  was evaluated in triage.  Pt had a few episodes of emesis earlier today, and per her spouse who is at bedside, he states when she tried to get up and go to the kitchen she had a syncopal episode.  She reportedly landed on her head and it took about 30 seconds for her to wake up.  No bladder or bowel incontinence.  Patient was recently pregnant in November.  She is currently reporting a headache, chest pain and tach.  Review of Systems  Positive: Syncope, chest pain Negative:   Physical Exam  BP (!) 110/92 (BP Location: Right Arm)   Pulse 85   Temp 98 F (36.7 C) (Oral)   Resp 18   Ht 5' (1.524 m)   Wt 51.3 kg   LMP 06/20/2024   SpO2 99%   BMI 22.09 kg/m  Gen:   Awake, no distress, timid and withdrawn Resp:  Normal effort  MSK:   Moves extremities without difficulty  Other:    Medical Decision Making  Medically screening exam initiated at 7:26 PM.  Appropriate orders placed.  Gina Lester was informed that the remainder of the evaluation will be completed by another provider, this initial triage assessment does not replace that evaluation, and the importance of remaining in the ED until their evaluation is complete.  Basic labs, respiratory panel, EKG ordered.   Gina Lester, [place] 07/05/24 1928

## 2024-07-22 ENCOUNTER — Ambulatory Visit

## 2024-07-22 NOTE — Progress Notes (Deleted)
" ° ° °  NURSE VISIT NOTE  Subjective:    Patient ID: Gina Lester, female    DOB: 02-May-1996, 28 y.o.   MRN: [id number]  HPI  Patient is a 28 y.o. H4E6976 female who presents for evaluation of amenorrhea. She believes she could be pregnant. {pregnancy desire:14500::Pregnancy is desired.} Sexual Activity: {sexual partners:705}. Current symptoms also include: {preg sx:18128}. Last period was {norm/abn:16337}.    Objective:    LMP 06/20/2024   Lab Review  No results found for any visits on 07/22/24.  Assessment:   No diagnosis found.  Plan:   {AOB + PREGNANCY PLAN:28596:p}  {AOB - PREGNANCY PLAN:28597:p}   Harlene Gander, CMA   "

## 2024-07-27 ENCOUNTER — Other Ambulatory Visit

## 2024-07-27 ENCOUNTER — Ambulatory Visit

## 2024-07-27 DIAGNOSIS — Z8759 Personal history of other complications of pregnancy, childbirth and the puerperium: Secondary | ICD-10-CM

## 2024-07-28 ENCOUNTER — Other Ambulatory Visit (INDEPENDENT_AMBULATORY_CARE_PROVIDER_SITE_OTHER)

## 2024-07-28 DIAGNOSIS — Z3A01 Less than 8 weeks gestation of pregnancy: Secondary | ICD-10-CM

## 2024-07-28 DIAGNOSIS — O3680X0 Pregnancy with inconclusive fetal viability, not applicable or unspecified: Secondary | ICD-10-CM | POA: Diagnosis not present

## 2024-07-28 DIAGNOSIS — Z8759 Personal history of other complications of pregnancy, childbirth and the puerperium: Secondary | ICD-10-CM

## 2024-07-28 LAB — BETA HCG QUANT (REF LAB): hCG Quant: 1437 m[IU]/mL

## 2024-08-02 ENCOUNTER — Other Ambulatory Visit: Payer: Self-pay

## 2024-08-02 ENCOUNTER — Emergency Department
Admission: EM | Admit: 2024-08-02 | Discharge: 2024-08-02 | Disposition: A | Discharge status: Home / Self Care | Attending: Emergency Medicine | Admitting: Emergency Medicine

## 2024-08-02 ENCOUNTER — Emergency Department

## 2024-08-02 DIAGNOSIS — O26891 Other specified pregnancy related conditions, first trimester: Secondary | ICD-10-CM | POA: Insufficient documentation

## 2024-08-02 DIAGNOSIS — R1032 Left lower quadrant pain: Secondary | ICD-10-CM | POA: Diagnosis not present

## 2024-08-02 DIAGNOSIS — Z3A01 Less than 8 weeks gestation of pregnancy: Secondary | ICD-10-CM | POA: Insufficient documentation

## 2024-08-02 DIAGNOSIS — R1031 Right lower quadrant pain: Secondary | ICD-10-CM | POA: Diagnosis not present

## 2024-08-02 LAB — URINALYSIS, ROUTINE W REFLEX MICROSCOPIC
Bilirubin Urine: NEGATIVE
Glucose, UA: NEGATIVE mg/dL
Hgb urine dipstick: NEGATIVE
Ketones, ur: NEGATIVE mg/dL
Leukocytes,Ua: NEGATIVE
Nitrite: NEGATIVE
Protein, ur: NEGATIVE mg/dL
Specific Gravity, Urine: 1.01 (ref 1.005–1.030)
pH: 6 (ref 5.0–8.0)

## 2024-08-02 LAB — CBC
HCT: 35.8 % — ABNORMAL LOW (ref 36.0–46.0)
Hemoglobin: 11.1 g/dL — ABNORMAL LOW (ref 12.0–15.0)
MCH: 22 pg — ABNORMAL LOW (ref 26.0–34.0)
MCHC: 31 g/dL (ref 30.0–36.0)
MCV: 71 fL — ABNORMAL LOW (ref 80.0–100.0)
Platelets: 344 K/uL (ref 150–400)
RBC: 5.04 MIL/uL (ref 3.87–5.11)
RDW: 16.5 % — ABNORMAL HIGH (ref 11.5–15.5)
WBC: 7.7 K/uL (ref 4.0–10.5)
nRBC: 0 % (ref 0.0–0.2)

## 2024-08-02 LAB — COMPREHENSIVE METABOLIC PANEL WITH GFR
ALT: 16 U/L (ref 0–44)
AST: 15 U/L (ref 15–41)
Albumin: 4.5 g/dL (ref 3.5–5.0)
Alkaline Phosphatase: 57 U/L (ref 38–126)
Anion gap: 11 (ref 5–15)
BUN: 10 mg/dL (ref 6–20)
CO2: 23 mmol/L (ref 22–32)
Calcium: 9 mg/dL (ref 8.9–10.3)
Chloride: 103 mmol/L (ref 98–111)
Creatinine, Ser: 0.49 mg/dL (ref 0.44–1.00)
GFR, Estimated: >60 mL/min
Glucose, Bld: 118 mg/dL — ABNORMAL HIGH (ref 70–99)
Potassium: 3.7 mmol/L (ref 3.5–5.1)
Sodium: 138 mmol/L (ref 135–145)
Total Bilirubin: 0.4 mg/dL (ref 0.0–1.2)
Total Protein: 7.8 g/dL (ref 6.5–8.1)

## 2024-08-02 LAB — HCG, QUANTITATIVE, PREGNANCY: hCG, Beta Chain, Quant, S: 11217 m[IU]/mL — ABNORMAL HIGH

## 2024-08-02 LAB — LIPASE, BLOOD: Lipase: 25 U/L (ref 11–51)

## 2024-08-02 NOTE — ED Provider Notes (Signed)
 "  [facility] Provider Note    Event Date/Time   First MD Initiated Contact with Patient 08/02/24 1121     (approximate)   History   Chief Complaint Abdominal Pain   HPI  Gina Lester is a 29 y.o. female [phone] with past medical history of molar pregnancy who presents to the ED complaining of abdominal pain.  Patient reports that she last had a regular period at the end of October, then had some light spotting in November, at which time a pregnancy test was negative.  She subsequently had a positive pregnancy test on 12/29, reports an ultrasound about 1 week ago showing intrauterine gestational sac.  Since this morning, she has had increasing crampy pain in the bilateral lower quadrants of her abdomen as well as in her lower back.  She denies any vaginal bleeding or discharge, has not had any dysuria, hematuria, fever, or flank pain.  She is concerned about another molar pregnancy, was advised by her OB to seek care in the ED.      Physical Exam   Triage Vital Signs: ED Triage Vitals  Encounter Vitals Group     BP 08/02/24 1111 109/67     Girls Systolic BP Percentile --      Girls Diastolic BP Percentile --      Boys Systolic BP Percentile --      Boys Diastolic BP Percentile --      Pulse Rate 08/02/24 1111 82     Resp 08/02/24 1111 16     Temp 08/02/24 1111 98.4 F (36.9 C)     Temp Source 08/02/24 1111 Oral     SpO2 08/02/24 1111 99 %     Weight 08/02/24 1110 116 lb (52.6 kg)     Height 08/02/24 1110 5' (1.524 m)     Head Circumference --      Peak Flow --      Pain Score 08/02/24 1109 3     Pain Loc --      Pain Education --      Exclude from Growth Chart --     Most recent vital signs: Vitals:   08/02/24 1111  BP: 109/67  Pulse: 82  Resp: 16  Temp: 98.4 F (36.9 C)  SpO2: 99%    Constitutional: Alert and oriented. Eyes: Conjunctivae are normal. Head: Atraumatic. Nose: No congestion/rhinnorhea. Mouth/Throat: Mucous  membranes are moist.  Cardiovascular: Normal rate, regular rhythm. Grossly normal heart sounds.  2+ radial pulses bilaterally. Respiratory: Normal respiratory effort.  No retractions. Lungs CTAB. Gastrointestinal: Soft and mildly tender to palpation in the bilateral lower quadrants with no rebound or guarding. No distention. Musculoskeletal: No lower extremity tenderness nor edema.  Neurologic:  Normal speech and language. No gross focal neurologic deficits are appreciated.    ED Results / Procedures / Treatments   Labs (all labs ordered are listed, but only abnormal results are displayed) Labs Reviewed  COMPREHENSIVE METABOLIC PANEL WITH GFR - Abnormal; Notable for the following components:      Result Value   Glucose, Bld 118 (*)    All other components within normal limits  CBC - Abnormal; Notable for the following components:   Hemoglobin 11.1 (*)    HCT 35.8 (*)    MCV 71.0 (*)    MCH 22.0 (*)    RDW 16.5 (*)    All other components within normal limits  URINALYSIS, ROUTINE W REFLEX MICROSCOPIC - Abnormal; Notable for the following components:  Color, Urine STRAW (*)    APPearance CLEAR (*)    All other components within normal limits  HCG, QUANTITATIVE, PREGNANCY - Abnormal; Notable for the following components:   hCG, Beta Chain, Quant, S 11,217 (*)    All other components within normal limits  LIPASE, BLOOD  POC URINE PREG, ED    RADIOLOGY Pelvic ultrasound reviewed and interpreted by me with intrauterine gestational sac.  PROCEDURES:  Critical Care performed: No  Procedures   MEDICATIONS ORDERED IN ED: Medications - No data to display   IMPRESSION / MDM / ASSESSMENT AND PLAN / ED COURSE  I reviewed the triage vital signs and the nursing notes.                              29 y.o. female [phone] with past medical history of molar pregnancy presents to the ED complaining of crampy pain in the bilateral lower quadrants of her abdomen since waking up this  morning.  Patient's presentation is most consistent with acute presentation with potential threat to life or bodily function.  Differential diagnosis includes, but is not limited to, ectopic pregnancy, threatened miscarriage, completed miscarriage, abdominal pain in pregnancy, molar pregnancy.  Patient well-appearing and in no acute distress, vital signs are unremarkable.  Her abdominal pain is relatively benign with minimal tenderness in the bilateral lower quadrants, patient declines Tylenol .  We will further assess with ultrasound, labs and urinalysis currently pending.  Labs without significant anemia, leukocytosis, electrolyte abnormality, or AKI.  LFTs are unremarkable, beta-hCG level noted to be 11,000.  Pelvic ultrasound shows intrauterine gestational sac but without yolk sac, no findings concerning for molar pregnancy and hCG level is also unlikely to be consistent with molar pregnancy.  Patient with minimal pain on reassessment and is appropriate for discharge home with outpatient OB/GYN follow-up.  She was counseled to return to the ED for new or worsening symptoms, patient agrees with plan.      FINAL CLINICAL IMPRESSION(S) / ED DIAGNOSES   Final diagnoses:  Abdominal pain during pregnancy in first trimester     Rx / DC Orders   ED Discharge Orders     None        Note:  This document was prepared using Dragon voice recognition software and may include unintentional dictation errors.   Willo Dunnings, MD 08/02/24 1351  "

## 2024-08-02 NOTE — ED Triage Notes (Addendum)
 Pt presents to ED from home C/O abdominal pain, cramping, nausea starting this AM. Denies vaginal bleeding.  Miscarriage in September  LMP 10/28 Reports 1 day spotting in November 6th pregnancy, 3 living children, 2 spontaneous abortion  12/29 + home pregnancy test 1/5 appt with OB, elevated hcg levels

## 2024-08-03 ENCOUNTER — Ambulatory Visit: Payer: Self-pay | Admitting: Certified Nurse Midwife

## 2024-08-03 DIAGNOSIS — Z3480 Encounter for supervision of other normal pregnancy, unspecified trimester: Secondary | ICD-10-CM

## 2024-08-03 DIAGNOSIS — Z8759 Personal history of other complications of pregnancy, childbirth and the puerperium: Secondary | ICD-10-CM

## 2024-08-04 ENCOUNTER — Other Ambulatory Visit

## 2024-08-04 DIAGNOSIS — Z8759 Personal history of other complications of pregnancy, childbirth and the puerperium: Secondary | ICD-10-CM

## 2024-08-04 NOTE — Telephone Encounter (Signed)
 Contacted the patient and scheduled for today, 1/13 and Thursday, 08/06/24 for labs

## 2024-08-05 LAB — BETA HCG QUANT (REF LAB): hCG Quant: 17062 m[IU]/mL

## 2024-08-05 NOTE — Addendum Note (Signed)
 Addended by: TAFT CAMELIA MATSU on: 08/05/2024 09:51 AM   Modules accepted: Orders

## 2024-08-06 ENCOUNTER — Other Ambulatory Visit

## 2024-08-06 DIAGNOSIS — Z3480 Encounter for supervision of other normal pregnancy, unspecified trimester: Secondary | ICD-10-CM

## 2024-08-06 DIAGNOSIS — Z8759 Personal history of other complications of pregnancy, childbirth and the puerperium: Secondary | ICD-10-CM

## 2024-08-07 ENCOUNTER — Other Ambulatory Visit: Payer: Self-pay | Admitting: Certified Nurse Midwife

## 2024-08-07 ENCOUNTER — Ambulatory Visit: Payer: Self-pay | Admitting: Certified Nurse Midwife

## 2024-08-07 DIAGNOSIS — Z8759 Personal history of other complications of pregnancy, childbirth and the puerperium: Secondary | ICD-10-CM

## 2024-08-07 LAB — BETA HCG QUANT (REF LAB): hCG Quant: 27414 m[IU]/mL

## 2024-08-10 ENCOUNTER — Ambulatory Visit

## 2024-08-10 ENCOUNTER — Other Ambulatory Visit: Payer: Self-pay

## 2024-08-10 ENCOUNTER — Other Ambulatory Visit

## 2024-08-10 DIAGNOSIS — O09291 Supervision of pregnancy with other poor reproductive or obstetric history, first trimester: Secondary | ICD-10-CM | POA: Diagnosis not present

## 2024-08-10 DIAGNOSIS — Z8759 Personal history of other complications of pregnancy, childbirth and the puerperium: Secondary | ICD-10-CM

## 2024-08-10 DIAGNOSIS — Z3A01 Less than 8 weeks gestation of pregnancy: Secondary | ICD-10-CM

## 2024-08-10 NOTE — Progress Notes (Signed)
 Order for f/u molar pregnancy ultrasound.

## 2024-08-11 ENCOUNTER — Ambulatory Visit: Payer: Self-pay | Admitting: Certified Nurse Midwife

## 2024-08-11 LAB — BETA HCG QUANT (REF LAB): hCG Quant: 56578 m[IU]/mL

## 2024-08-12 ENCOUNTER — Other Ambulatory Visit

## 2024-08-12 ENCOUNTER — Telehealth: Payer: Self-pay | Admitting: Certified Nurse Midwife

## 2024-08-12 NOTE — Telephone Encounter (Signed)
 Reached out to pt to reschedule lab appt that was scheduled on 08/12/2024 at 10:40 per E. Slaughterbeck.  Left message for pt to call back to rescheduled.

## 2024-08-13 ENCOUNTER — Other Ambulatory Visit

## 2024-08-13 DIAGNOSIS — Z8759 Personal history of other complications of pregnancy, childbirth and the puerperium: Secondary | ICD-10-CM

## 2024-08-13 NOTE — Telephone Encounter (Signed)
 Reached out to pt (2x) to reschedule lab appt that was scheduled on 08/12/2024 at 10:40.  Was able to reschedule the appt to 08/13/2024 at 2:20.

## 2024-08-14 LAB — BETA HCG QUANT (REF LAB): hCG Quant: 87340 m[IU]/mL

## 2024-08-18 ENCOUNTER — Telehealth: Payer: Self-pay | Admitting: Certified Nurse Midwife

## 2024-08-18 NOTE — Telephone Encounter (Signed)
 Called patient to get her scheduled for her NOB intake. Had to leave a message and asked patient to give us  a call back.

## 2024-08-28 ENCOUNTER — Telehealth

## 2024-08-28 DIAGNOSIS — Z3481 Encounter for supervision of other normal pregnancy, first trimester: Secondary | ICD-10-CM | POA: Insufficient documentation

## 2024-08-28 NOTE — Progress Notes (Cosign Needed Addendum)
 New OB Intake  I connected with  Gina Lester on 08/28/24 at  8:15 AM EST by MyChart Video Visit and verified that I am speaking with the correct person using two identifiers. Nurse is located at [facility] and pt is located at home.  I discussed the limitations, risks, security and privacy concerns of performing an evaluation and management service by telephone and the availability of in person appointments. I also discussed with the patient that there may be a patient responsible charge related to this service. The patient expressed understanding and agreed to proceed.  I explained I am completing New OB Intake today. We discussed her EDD of 04/01/2025 that is based on LMP of 06/25/2024. Pt is G6/P3. I reviewed her allergies, medications, Medical/Surgical/OB history, and appropriate screenings. There are cats in the home: no. If yes: SABRA Based on history, this is a/an pregnancy . Her obstetrical history is significant for n/a.  Patient Active Problem List   Diagnosis Date Noted   History of molar pregnancy 05/04/2024   Vaginal bleeding during pregnancy 04/10/2024   Indication for care in labor and delivery, antepartum 04/10/2024   Subchorionic hematoma in second trimester 04/07/2024   Marginal placenta previa 04/07/2024   STD (sexually transmitted disease) complicating pregnancy, antepartum 04/08/2022   Supervision of other normal pregnancy, antepartum 03/22/2022   Susceptible to varicella (non-immune), currently pregnant 05/19/2015    Concerns addressed today:   Delivery Plans:  Plans to deliver at [facility].  Anatomy US  Explained first scheduled US  will be . Anatomy US  will be scheduled around [redacted] weeks gestational age.  Labs Discussed genetic screening with patient. Patient does genetic testing to be drawn at new OB visit. Discussed possible labs to be drawn at new OB appointment.  COVID Vaccine Patient has had COVID vaccine.   Social Determinants of  Health Food Insecurity: denies food insecurity Childcare: Discussed no children allowed at ultrasound appointments.   First visit review I reviewed new OB appt with pt. I explained she will have blood work and pap smear/pelvic exam if indicated. Explained pt will be seen by Damien Parsley, CNM at first visit; encounter routed to appropriate provider.   Burnard LITTIE Ro, CMA 08/28/2024  8:14 AM

## 2024-09-16 ENCOUNTER — Encounter: Admitting: Certified Nurse Midwife
